# Patient Record
Sex: Female | Born: 1950 | Race: White | Hispanic: No | Marital: Married | Smoking: Former smoker
Health system: Southern US, Community
[De-identification: ages and names within clinical notes are randomized; demographics above are authoritative.]

## PROBLEM LIST (undated history)

## (undated) DIAGNOSIS — E785 Hyperlipidemia, unspecified: Secondary | ICD-10-CM

## (undated) DIAGNOSIS — O00109 Unspecified tubal pregnancy without intrauterine pregnancy: Secondary | ICD-10-CM

## (undated) DIAGNOSIS — G629 Polyneuropathy, unspecified: Secondary | ICD-10-CM

## (undated) DIAGNOSIS — E119 Type 2 diabetes mellitus without complications: Secondary | ICD-10-CM

## (undated) DIAGNOSIS — T7840XA Allergy, unspecified, initial encounter: Secondary | ICD-10-CM

## (undated) DIAGNOSIS — M1990 Unspecified osteoarthritis, unspecified site: Secondary | ICD-10-CM

## (undated) DIAGNOSIS — I10 Essential (primary) hypertension: Secondary | ICD-10-CM

## (undated) DIAGNOSIS — M869 Osteomyelitis, unspecified: Secondary | ICD-10-CM

## (undated) DIAGNOSIS — I509 Heart failure, unspecified: Secondary | ICD-10-CM

## (undated) DIAGNOSIS — C50919 Malignant neoplasm of unspecified site of unspecified female breast: Secondary | ICD-10-CM

## (undated) DIAGNOSIS — E079 Disorder of thyroid, unspecified: Secondary | ICD-10-CM

## (undated) DIAGNOSIS — E039 Hypothyroidism, unspecified: Secondary | ICD-10-CM

## (undated) DIAGNOSIS — N183 Chronic kidney disease, stage 3 unspecified: Secondary | ICD-10-CM

## (undated) HISTORY — DX: Unspecified tubal pregnancy without intrauterine pregnancy: O00.109

## (undated) HISTORY — DX: Type 2 diabetes mellitus without complications: E11.9

## (undated) HISTORY — DX: Allergy, unspecified, initial encounter: T78.40XA

## (undated) HISTORY — PX: ECTOPIC PREGNANCY SURGERY: SHX613

## (undated) HISTORY — DX: Disorder of thyroid, unspecified: E07.9

## (undated) HISTORY — PX: APPENDECTOMY: SHX54

---

## 2003-06-19 HISTORY — PX: CHOLECYSTECTOMY: SHX55

## 2008-06-18 DIAGNOSIS — C50911 Malignant neoplasm of unspecified site of right female breast: Secondary | ICD-10-CM

## 2008-06-18 DIAGNOSIS — C50919 Malignant neoplasm of unspecified site of unspecified female breast: Secondary | ICD-10-CM

## 2008-06-18 HISTORY — PX: CATARACT EXTRACTION W/ INTRAOCULAR LENS  IMPLANT, BILATERAL: SHX1307

## 2008-06-18 HISTORY — DX: Malignant neoplasm of unspecified site of unspecified female breast: C50.919

## 2008-06-18 HISTORY — DX: Malignant neoplasm of unspecified site of right female breast: C50.911

## 2008-06-18 HISTORY — PX: MASTECTOMY: SHX3

## 2008-06-18 HISTORY — PX: MASTECTOMY, PARTIAL: SHX709

## 2009-06-18 HISTORY — PX: REDUCTION MAMMAPLASTY: SUR839

## 2011-06-19 LAB — HM MAMMOGRAPHY: HM Mammogram: NORMAL

## 2013-07-06 ENCOUNTER — Ambulatory Visit: Payer: Self-pay | Admitting: Physician Assistant

## 2013-07-06 DIAGNOSIS — R05 Cough: Secondary | ICD-10-CM | POA: Diagnosis not present

## 2013-07-06 DIAGNOSIS — J40 Bronchitis, not specified as acute or chronic: Secondary | ICD-10-CM | POA: Diagnosis not present

## 2013-07-06 DIAGNOSIS — R059 Cough, unspecified: Secondary | ICD-10-CM | POA: Diagnosis not present

## 2013-07-06 DIAGNOSIS — R918 Other nonspecific abnormal finding of lung field: Secondary | ICD-10-CM | POA: Diagnosis not present

## 2013-07-06 DIAGNOSIS — J111 Influenza due to unidentified influenza virus with other respiratory manifestations: Secondary | ICD-10-CM | POA: Diagnosis not present

## 2013-07-06 LAB — RAPID INFLUENZA A&B ANTIGENS

## 2013-09-21 LAB — LIPID PANEL
CHOLESTEROL: 198 mg/dL (ref 0–200)
HDL: 43 mg/dL (ref 35–70)
LDL Cholesterol: 84 mg/dL
TRIGLYCERIDES: 355 mg/dL — AB (ref 40–160)

## 2014-01-25 DIAGNOSIS — H40009 Preglaucoma, unspecified, unspecified eye: Secondary | ICD-10-CM | POA: Diagnosis not present

## 2014-02-16 DIAGNOSIS — H40009 Preglaucoma, unspecified, unspecified eye: Secondary | ICD-10-CM | POA: Diagnosis not present

## 2014-02-23 DIAGNOSIS — H40009 Preglaucoma, unspecified, unspecified eye: Secondary | ICD-10-CM | POA: Diagnosis not present

## 2014-04-20 DIAGNOSIS — H40013 Open angle with borderline findings, low risk, bilateral: Secondary | ICD-10-CM | POA: Diagnosis not present

## 2014-05-19 DIAGNOSIS — R21 Rash and other nonspecific skin eruption: Secondary | ICD-10-CM | POA: Diagnosis not present

## 2014-05-19 DIAGNOSIS — E119 Type 2 diabetes mellitus without complications: Secondary | ICD-10-CM | POA: Diagnosis not present

## 2014-05-19 DIAGNOSIS — Z23 Encounter for immunization: Secondary | ICD-10-CM | POA: Diagnosis not present

## 2014-05-19 DIAGNOSIS — M170 Bilateral primary osteoarthritis of knee: Secondary | ICD-10-CM | POA: Diagnosis not present

## 2014-05-19 DIAGNOSIS — E039 Hypothyroidism, unspecified: Secondary | ICD-10-CM | POA: Diagnosis not present

## 2014-05-19 DIAGNOSIS — I10 Essential (primary) hypertension: Secondary | ICD-10-CM | POA: Diagnosis not present

## 2014-08-10 DIAGNOSIS — E119 Type 2 diabetes mellitus without complications: Secondary | ICD-10-CM | POA: Diagnosis not present

## 2014-08-10 DIAGNOSIS — J111 Influenza due to unidentified influenza virus with other respiratory manifestations: Secondary | ICD-10-CM | POA: Diagnosis not present

## 2014-08-10 DIAGNOSIS — I10 Essential (primary) hypertension: Secondary | ICD-10-CM | POA: Diagnosis not present

## 2014-08-10 DIAGNOSIS — E039 Hypothyroidism, unspecified: Secondary | ICD-10-CM | POA: Diagnosis not present

## 2014-08-10 LAB — HEMOGLOBIN A1C: HEMOGLOBIN A1C: 9.8 % — AB (ref 4.0–6.0)

## 2014-08-10 LAB — TSH: TSH: 1.42 u[IU]/mL (ref <=5.90)

## 2014-09-03 DIAGNOSIS — I10 Essential (primary) hypertension: Secondary | ICD-10-CM | POA: Diagnosis not present

## 2014-09-03 DIAGNOSIS — E039 Hypothyroidism, unspecified: Secondary | ICD-10-CM | POA: Diagnosis not present

## 2014-09-03 DIAGNOSIS — E1165 Type 2 diabetes mellitus with hyperglycemia: Secondary | ICD-10-CM | POA: Diagnosis not present

## 2014-09-03 DIAGNOSIS — E1142 Type 2 diabetes mellitus with diabetic polyneuropathy: Secondary | ICD-10-CM | POA: Diagnosis not present

## 2014-09-03 DIAGNOSIS — M170 Bilateral primary osteoarthritis of knee: Secondary | ICD-10-CM | POA: Diagnosis not present

## 2014-10-04 ENCOUNTER — Encounter: Payer: Self-pay | Admitting: Internal Medicine

## 2014-10-04 DIAGNOSIS — E1142 Type 2 diabetes mellitus with diabetic polyneuropathy: Secondary | ICD-10-CM | POA: Insufficient documentation

## 2014-10-04 DIAGNOSIS — E118 Type 2 diabetes mellitus with unspecified complications: Secondary | ICD-10-CM | POA: Insufficient documentation

## 2014-10-04 DIAGNOSIS — E1165 Type 2 diabetes mellitus with hyperglycemia: Secondary | ICD-10-CM

## 2014-10-04 DIAGNOSIS — E039 Hypothyroidism, unspecified: Secondary | ICD-10-CM | POA: Insufficient documentation

## 2014-10-04 DIAGNOSIS — R21 Rash and other nonspecific skin eruption: Secondary | ICD-10-CM | POA: Insufficient documentation

## 2014-10-04 DIAGNOSIS — M1710 Unilateral primary osteoarthritis, unspecified knee: Secondary | ICD-10-CM | POA: Insufficient documentation

## 2014-10-04 DIAGNOSIS — M179 Osteoarthritis of knee, unspecified: Secondary | ICD-10-CM | POA: Insufficient documentation

## 2014-10-04 DIAGNOSIS — I10 Essential (primary) hypertension: Secondary | ICD-10-CM | POA: Insufficient documentation

## 2014-10-04 DIAGNOSIS — E1149 Type 2 diabetes mellitus with other diabetic neurological complication: Secondary | ICD-10-CM | POA: Insufficient documentation

## 2014-10-12 DIAGNOSIS — H40003 Preglaucoma, unspecified, bilateral: Secondary | ICD-10-CM | POA: Diagnosis not present

## 2014-12-06 ENCOUNTER — Other Ambulatory Visit: Payer: Self-pay | Admitting: Internal Medicine

## 2014-12-06 ENCOUNTER — Encounter: Payer: Self-pay | Admitting: Internal Medicine

## 2014-12-06 ENCOUNTER — Ambulatory Visit (INDEPENDENT_AMBULATORY_CARE_PROVIDER_SITE_OTHER): Payer: Medicare Other | Admitting: Internal Medicine

## 2014-12-06 VITALS — BP 100/60 | HR 84 | Ht 68.0 in | Wt 243.6 lb

## 2014-12-06 DIAGNOSIS — E1142 Type 2 diabetes mellitus with diabetic polyneuropathy: Secondary | ICD-10-CM | POA: Diagnosis not present

## 2014-12-06 DIAGNOSIS — I10 Essential (primary) hypertension: Secondary | ICD-10-CM | POA: Diagnosis not present

## 2014-12-06 DIAGNOSIS — E039 Hypothyroidism, unspecified: Secondary | ICD-10-CM

## 2014-12-06 MED ORDER — GABAPENTIN 100 MG PO CAPS: 300.0000 mg | ORAL_CAPSULE | Freq: Every day | ORAL | Status: DC

## 2014-12-06 NOTE — Progress Notes (Signed)
Date:  12/06/2014   Name:  Kendra Walton   DOB:  1950-11-10   MRN:  [id number]   Chief Complaint: Diabetes and Hypertension Diabetes She presents for her follow-up diabetic visit. She has type 2 diabetes mellitus. Her disease course has been improving (last A1C 9.0). Pertinent negatives for hypoglycemia include no dizziness, headaches or tremors. Associated symptoms include foot paresthesias (improved some with gabapentin 100 mg a hs). Pertinent negatives for diabetes include no chest pain, no polydipsia and no polyuria. Symptoms are stable. Current diabetic treatment includes oral agent (monotherapy). She is compliant with treatment all of the time. Her weight is decreasing steadily. She is following a generally healthy (portion control rather than strict low carb) diet. She never participates in exercise. Her home blood glucose trend is decreasing steadily. Her breakfast blood glucose is taken between 7-8 am. Her breakfast blood glucose range is generally 140-180 mg/dl. An ACE inhibitor/angiotensin II receptor blocker is being taken. Eye exam is current.  Hypertension This is a chronic problem. The problem is unchanged. Associated symptoms include neck pain. Pertinent negatives include no chest pain, headaches, palpitations or shortness of breath. Risk factors for coronary artery disease include diabetes mellitus and dyslipidemia. Hypertensive end-organ damage includes a thyroid problem.  Thyroid Problem Presents for follow-up visit. Patient reports no cold intolerance, constipation, hoarse voice, leg swelling, palpitations or tremors. The symptoms have been stable. Past treatments include levothyroxine. The treatment provided mild relief. The following procedures have not been performed: radioiodine uptake scan and thyroidectomy.     Review of Systems:  Review of Systems  Constitutional: Positive for unexpected weight change (14 lbs with recent diet changes). Negative for fever.  HENT:  Negative for hoarse voice.   Eyes: Negative for visual disturbance (taken off of glaucoma eye drops).  Respiratory: Negative for chest tightness and shortness of breath.   Cardiovascular: Negative for chest pain, palpitations and leg swelling.  Gastrointestinal: Negative for constipation.  Endocrine: Negative for cold intolerance, polydipsia and polyuria.  Musculoskeletal: Positive for neck pain.  Skin: Negative for rash.  Neurological: Positive for numbness. Negative for dizziness, tremors and headaches.  Psychiatric/Behavioral: Negative for dysphoric mood and decreased concentration.    Patient Active Problem List   Diagnosis Date Noted  . Acquired hypothyroidism 10/04/2014  . Diabetes mellitus with polyneuropathy 10/04/2014  . Essential (primary) hypertension 10/04/2014  . Arthritis of knee, degenerative 10/04/2014  . Cutaneous eruption 10/04/2014  . Diabetes mellitus type 2, uncontrolled 10/04/2014    Prior to Admission medications   Medication Sig Start Date End Date Taking? Authorizing Provider  Cholecalciferol (VITAMIN D) 2000 UNITS tablet Take 1 tablet by mouth daily.    Historical Provider, MD  Cyanocobalamin 500 MCG LOZG Take 1 lozenge by mouth daily.    Historical Provider, MD  esomeprazole (NEXIUM) 20 MG capsule Take 1 capsule by mouth daily.    Historical Provider, MD  gabapentin (NEURONTIN) 100 MG capsule Take 1 capsule by mouth at bedtime. 09/03/14   Historical Provider, MD  latanoprost (XALATAN) 0.005 % ophthalmic solution Apply to eye daily.    Historical Provider, MD  levothyroxine (SYNTHROID, LEVOTHROID) 125 MCG tablet Take 1 tablet by mouth daily.    Historical Provider, MD  lisinopril-hydrochlorothiazide (PRINZIDE,ZESTORETIC) 10-12.5 MG per tablet Take 1 tablet by mouth daily.    Historical Provider, MD  Magnesium Oxide 400 (240 MG) MG TABS Take 1 tablet by mouth daily.    Historical Provider, MD  metFORMIN (GLUCOPHAGE-XR) 500 MG 24 hr tablet Take 1,000 mg  by mouth  2 (two) times daily.    Historical Provider, MD  MULTIPLE VITAMINS-MINERALS ER PO Take 1 tablet by mouth daily.    Historical Provider, MD  nystatin cream (MYCOSTATIN) Apply 1 application topically 2 (two) times daily. 05/19/14   Historical Provider, MD  ONE TOUCH ULTRA TEST test strip U UTD TO TEST D 10/24/14   Historical Provider, MD  Jonetta Speak LANCETS 99I MISC U UTD 12/01/14   Historical Provider, MD  Potassium Gluconate 550 MG TABS Take 1 tablet by mouth daily.    Historical Provider, MD  tamoxifen (NOLVADEX) 20 MG tablet Take 1 tablet by mouth daily.    Historical Provider, MD  traMADol (ULTRAM) 50 MG tablet Take 1 tablet by mouth 4 (four) times daily as needed. 09/03/14   Historical Provider, MD    No Known Allergies  Past Surgical History  Procedure Laterality Date  . Mastectomy Right 2010  . Cholecystectomy  2005    History  Substance Use Topics  . Smoking status: Former Smoker    Quit date: 06/18/2012  . Smokeless tobacco: Not on file  . Alcohol Use: Not on file     Medication list has been reviewed and updated.  Physical Examination:  Physical Exam  Constitutional: She is oriented to person, place, and time. She appears well-developed and well-nourished.  HENT:  Head: Normocephalic.  Neck: No thyromegaly present.  Cardiovascular: Normal rate, regular rhythm and normal heart sounds.   Pulmonary/Chest: Effort normal and breath sounds normal. No respiratory distress. She has no wheezes.  Musculoskeletal: She exhibits no edema or tenderness.  Lymphadenopathy:    She has no cervical adenopathy.  Neurological: She is alert and oriented to person, place, and time.    BP 100/60 mmHg  Pulse 84  Ht 5\' 8"  (1.727 m)  Wt 243 lb 9.6 oz (110.496 kg)  BMI 37.05 kg/m2  Assessment and Plan: 1. Essential (primary) hypertension Controlled - Basic metabolic panel  2. Diabetic polyneuropathy associated with type 2 diabetes mellitus Will add medication if needed according  to A1C Increase gabapentin to 300 mg hs - Hemoglobin A1c - gabapentin (NEURONTIN) 100 MG capsule; Take 3 capsules (300 mg total) by mouth at bedtime.  Dispense: 90 capsule; Refill: 5  3. Acquired hypothyroidism supplemented   Halina Maidens, MD [facility]  12/06/2014

## 2014-12-06 NOTE — Patient Instructions (Signed)
Increase gabapentin up to 300 mg at bedtime.

## 2014-12-07 LAB — BASIC METABOLIC PANEL
BUN/Creatinine Ratio: 21 (ref 11–26)
BUN: 14 mg/dL (ref 8–27)
CALCIUM: 9.6 mg/dL (ref 8.7–10.3)
CO2: 26 mmol/L (ref 18–29)
Chloride: 94 mmol/L — ABNORMAL LOW (ref 97–108)
Creatinine, Ser: 0.67 mg/dL (ref 0.57–1.00)
GFR calc Af Amer: 106 mL/min/{1.73_m2} (ref >59)
GFR calc non Af Amer: 92 mL/min/{1.73_m2} (ref >59)
Glucose: 153 mg/dL — ABNORMAL HIGH (ref 65–99)
POTASSIUM: 4.7 mmol/L (ref 3.5–5.2)
Sodium: 138 mmol/L (ref 134–144)

## 2014-12-07 LAB — HEMOGLOBIN A1C
Est. average glucose Bld gHb Est-mCnc: 151 mg/dL
HEMOGLOBIN A1C: 6.9 % — AB (ref 4.8–5.6)

## 2015-02-07 ENCOUNTER — Telehealth: Payer: Self-pay

## 2015-02-07 NOTE — Telephone Encounter (Signed)
Patient states she needs Rx for Tramadol, has been taking 2 t.i.d. Rather than four a day, is out and needs this refilled.dr

## 2015-02-08 ENCOUNTER — Other Ambulatory Visit: Payer: Self-pay | Admitting: Internal Medicine

## 2015-02-08 MED ORDER — TRAMADOL HCL 50 MG PO TABS: 100.0000 mg | ORAL_TABLET | Freq: Three times a day (TID) | ORAL | Status: DC | PRN

## 2015-02-21 ENCOUNTER — Other Ambulatory Visit: Payer: Self-pay | Admitting: Internal Medicine

## 2015-02-28 ENCOUNTER — Other Ambulatory Visit: Payer: Self-pay | Admitting: Internal Medicine

## 2015-03-28 ENCOUNTER — Other Ambulatory Visit: Payer: Self-pay | Admitting: Internal Medicine

## 2015-04-07 ENCOUNTER — Ambulatory Visit
Admission: RE | Admit: 2015-04-07 | Discharge: 2015-04-07 | Disposition: A | Discharge status: Home / Self Care | Payer: Medicare Other | Source: Ambulatory Visit | Attending: Internal Medicine | Admitting: Internal Medicine

## 2015-04-07 ENCOUNTER — Ambulatory Visit (INDEPENDENT_AMBULATORY_CARE_PROVIDER_SITE_OTHER): Payer: Medicare Other | Admitting: Internal Medicine

## 2015-04-07 ENCOUNTER — Other Ambulatory Visit: Payer: Self-pay | Admitting: Internal Medicine

## 2015-04-07 ENCOUNTER — Encounter: Payer: Self-pay | Admitting: Internal Medicine

## 2015-04-07 VITALS — BP 100/54 | HR 88 | Ht 68.0 in | Wt 230.6 lb

## 2015-04-07 DIAGNOSIS — E1142 Type 2 diabetes mellitus with diabetic polyneuropathy: Secondary | ICD-10-CM | POA: Diagnosis not present

## 2015-04-07 DIAGNOSIS — I10 Essential (primary) hypertension: Secondary | ICD-10-CM | POA: Diagnosis not present

## 2015-04-07 DIAGNOSIS — M25531 Pain in right wrist: Secondary | ICD-10-CM | POA: Insufficient documentation

## 2015-04-07 DIAGNOSIS — Z853 Personal history of malignant neoplasm of breast: Secondary | ICD-10-CM | POA: Insufficient documentation

## 2015-04-07 DIAGNOSIS — R300 Dysuria: Secondary | ICD-10-CM | POA: Diagnosis not present

## 2015-04-07 DIAGNOSIS — Z23 Encounter for immunization: Secondary | ICD-10-CM

## 2015-04-07 DIAGNOSIS — E1165 Type 2 diabetes mellitus with hyperglycemia: Secondary | ICD-10-CM | POA: Diagnosis not present

## 2015-04-07 DIAGNOSIS — Z1239 Encounter for other screening for malignant neoplasm of breast: Secondary | ICD-10-CM

## 2015-04-07 DIAGNOSIS — E039 Hypothyroidism, unspecified: Secondary | ICD-10-CM

## 2015-04-07 DIAGNOSIS — C50911 Malignant neoplasm of unspecified site of right female breast: Secondary | ICD-10-CM | POA: Diagnosis not present

## 2015-04-07 DIAGNOSIS — Z170 Estrogen receptor positive status [ER+]: Secondary | ICD-10-CM

## 2015-04-07 DIAGNOSIS — IMO0002 Reserved for concepts with insufficient information to code with codable children: Secondary | ICD-10-CM

## 2015-04-07 LAB — POCT URINALYSIS DIPSTICK
Bilirubin, UA: NEGATIVE
Blood, UA: NEGATIVE
Glucose, UA: NEGATIVE
KETONES UA: NEGATIVE
Leukocytes, UA: NEGATIVE
Nitrite, UA: NEGATIVE
PROTEIN UA: NEGATIVE
SPEC GRAV UA: 1.01
UROBILINOGEN UA: 0.2
pH, UA: 5

## 2015-04-07 MED ORDER — ONETOUCH DELICA LANCETS 33G MISC: 1.0000 | Freq: Two times a day (BID) | Status: DC

## 2015-04-07 MED ORDER — ONETOUCH ULTRA BLUE VI STRP: 1.0000 | ORAL_STRIP | Freq: Two times a day (BID) | Status: DC

## 2015-04-07 MED ORDER — LISINOPRIL-HYDROCHLOROTHIAZIDE 10-12.5 MG PO TABS: 1.0000 | ORAL_TABLET | Freq: Every day | ORAL | Status: DC

## 2015-04-07 MED ORDER — LEVOTHYROXINE SODIUM 125 MCG PO TABS: 250.0000 ug | ORAL_TABLET | Freq: Every day | ORAL | Status: DC

## 2015-04-07 NOTE — Progress Notes (Signed)
Date:  04/07/2015   Name:  Kendra Walton   DOB:  [birth year]   MRN:  [id number]   Chief Complaint: Diabetes; Hypertension; Hypothyroidism; and Urinary Tract Infection Diabetes She presents for her follow-up diabetic visit. She has type 2 diabetes mellitus. Her disease course has been stable. Pertinent negatives for hypoglycemia include no headaches, nervousness/anxiousness or tremors. Pertinent negatives for diabetes include no chest pain and no fatigue. Current diabetic treatment includes oral agent (monotherapy) (metformin). Her breakfast blood glucose is taken between 8-9 am. Her breakfast blood glucose range is generally 110-130 mg/dl. Her dinner blood glucose is taken between 6-7 pm. Her dinner blood glucose range is generally 140-180 mg/dl.  Hypertension This is a chronic problem. The current episode started more than 1 year ago. The problem is controlled. Pertinent negatives include no chest pain, headaches, neck pain, palpitations or shortness of breath. Past treatments include ACE inhibitors and diuretics. There are no compliance problems.  Hypertensive end-organ damage includes a thyroid problem.  Urinary Tract Infection  This is a new problem. The current episode started yesterday. The problem occurs intermittently. The pain is mild (tingling sensation). There has been no fever. There is no history of pyelonephritis. Associated symptoms include urgency. Pertinent negatives include no chills, frequency or hematuria. She has tried increased fluids for the symptoms. The treatment provided mild (feels better this am after a dose of keflex) relief.  Wrist Pain  This is a new problem. The current episode started more than 1 month ago. There has been no history of extremity trauma. The problem occurs constantly. The problem has been unchanged. The quality of the pain is described as aching. Pertinent negatives include no fever or numbness. She has tried nothing for the symptoms. Family  history does not include gout or rheumatoid arthritis. Her past medical history is significant for diabetes.  Thyroid Problem Presents for follow-up visit. Patient reports no anxiety, constipation, diaphoresis, fatigue, palpitations or tremors. The symptoms have been stable. Past treatments include levothyroxine. Her past medical history is significant for diabetes. There are no known risk factors.  History of breast cancer -patient has completed 5 years of tamoxifen therapy. Her inclination was to continue taking it indefinitely - however in view of lack of evidence will discontinue it. She is encouraged to follow-up with annual left diagnostic mammograms.   Review of Systems  Constitutional: Positive for unexpected weight change (slowly losing weight with diet changes). Negative for fever, chills, diaphoresis and fatigue.  HENT: Negative for ear pain, hearing loss, sinus pressure, sore throat, trouble swallowing and voice change.   Eyes: Negative for visual disturbance.  Respiratory: Negative for cough, shortness of breath and wheezing.   Cardiovascular: Negative for chest pain, palpitations and leg swelling.  Gastrointestinal: Negative for abdominal pain, constipation and blood in stool.  Genitourinary: Positive for urgency. Negative for dysuria, frequency, hematuria, vaginal bleeding and vaginal discharge.  Musculoskeletal: Positive for arthralgias. Negative for neck pain and neck stiffness.  Skin: Negative for color change and rash.  Allergic/Immunologic: Negative for environmental allergies.  Neurological: Negative for tremors, numbness and headaches.  Hematological: Negative for adenopathy. Does not bruise/bleed easily.  Psychiatric/Behavioral: Negative for sleep disturbance and dysphoric mood. The patient is not nervous/anxious.     Patient Active Problem List   Diagnosis Date Noted  . Wrist pain, right 04/07/2015  . Cancer of right breast, stage 1, estrogen receptor positive ([place])  04/07/2015  . Acquired hypothyroidism 10/04/2014  . Diabetes mellitus with polyneuropathy ([place]) 10/04/2014  . Essential (  primary) hypertension 10/04/2014  . Arthritis of knee, degenerative 10/04/2014  . Cutaneous eruption 10/04/2014  . Diabetes mellitus type 2, uncontrolled ([place]) 10/04/2014    Prior to Admission medications   Medication Sig Start Date End Date Taking? Authorizing Provider  gabapentin (NEURONTIN) 100 MG capsule Take 3 capsules (300 mg total) by mouth at bedtime. 12/06/14  Yes Glean Hess, MD  levothyroxine (SYNTHROID, LEVOTHROID) 125 MCG tablet Take 2 tablets by mouth daily.    Yes Historical Provider, MD  lisinopril-hydrochlorothiazide (PRINZIDE,ZESTORETIC) 10-12.5 MG per tablet Take 1 tablet by mouth daily.   Yes Historical Provider, MD  metFORMIN (GLUCOPHAGE-XR) 500 MG 24 hr tablet TAKE 2 TABLETS BY MOUTH TWICE DAILY 03/29/15  Yes Glean Hess, MD  MULTIPLE VITAMINS-MINERALS ER PO Take 1 tablet by mouth daily.   Yes Historical Provider, MD  ONE TOUCH ULTRA TEST test strip U UTD TO TEST D 10/24/14  Yes Historical Provider, MD  [facility] DELICA LANCETS 22W MISC U UTD 12/01/14  Yes Historical Provider, MD  tamoxifen (NOLVADEX) 20 MG tablet Take 1 tablet by mouth daily.   Yes Historical Provider, MD  traMADol (ULTRAM) 50 MG tablet Take 2 tablets (100 mg total) by mouth 3 (three) times daily as needed. 02/08/15  Yes Glean Hess, MD  Magnesium Oxide 400 (240 MG) MG TABS Take 1 tablet by mouth daily.    Historical Provider, MD    No Known Allergies  Past Surgical History  Procedure Laterality Date  . Mastectomy Right 2010  . Cholecystectomy  2005    Social History  Substance Use Topics  . Smoking status: Former Smoker    Quit date: 06/18/2012  . Smokeless tobacco: None  . Alcohol Use: No    Medication list has been reviewed and updated.   Physical Exam  Constitutional: She is oriented to person, place, and time. She appears well-developed and  well-nourished. No distress.  HENT:  Head: Normocephalic and atraumatic.  Eyes: Conjunctivae are normal. Right eye exhibits no discharge. Left eye exhibits no discharge. No scleral icterus.  Neck: Normal range of motion. Neck supple. No thyromegaly present.  Cardiovascular: Normal rate, regular rhythm and normal heart sounds.   Pulmonary/Chest: Effort normal and breath sounds normal. No respiratory distress. She has no wheezes.  Musculoskeletal: Normal range of motion. She exhibits no edema.       Arms: Lymphadenopathy:    She has no cervical adenopathy.  Neurological: She is alert and oriented to person, place, and time. She has normal reflexes.  Skin: Skin is warm and dry. No rash noted.  Psychiatric: She has a normal mood and affect. Her behavior is normal. Thought content normal.  Nursing note and vitals reviewed.   BP 100/54 mmHg  Pulse 88  Ht 5\' 8"  (1.727 m)  Wt 230 lb 9.6 oz (104.599 kg)  BMI 35.07 kg/m2  Assessment and Plan: 1. Dysuria Urinalysis is negative; recommend increasing fluid intake and follow-up if symptoms worsen - POCT urinalysis dipstick  2. Flu vaccine need - Flu Vaccine QUAD 36+ mos PF IM (Fluarix & Fluzone Quad PF)  3. Diabetic polyneuropathy associated with type 2 diabetes mellitus (HCC) Continue oral agents weight loss and diet - Hemoglobin A1c  4. Uncontrolled type 2 diabetes mellitus with diabetic polyneuropathy, without long-term current use of insulin (HCC) Stable symptoms without progression - Hemoglobin A1c - ONE TOUCH ULTRA TEST test strip; 1 each by Other route 2 (two) times daily. Use as instructed  Dispense: 100 each; Refill: 3 -  ONETOUCH DELICA LANCETS 79K MISC; 1 each by Other route 2 (two) times daily.  Dispense: 100 each; Refill: 3  5. Acquired hypothyroidism Supplementing - levothyroxine (SYNTHROID, LEVOTHROID) 125 MCG tablet; Take 2 tablets (250 mcg total) by mouth daily.  Dispense: 30 tablet; Refill: 5 - TSH  6. Essential  (primary) hypertension Controlled - lisinopril-hydrochlorothiazide (PRINZIDE,ZESTORETIC) 10-12.5 MG tablet; Take 1 tablet by mouth daily.  Dispense: 30 tablet; Refill: 5  7. Wrist pain, right Obtain x-ray to rule out structural problem Begin Aspercreme topical 3 times a day and modify activities for suspected tendinitis - DG Wrist Complete Right; Future  8. HX: breast cancer Due for left mammogram Will discontinue tamoxifen - MM Digital Diagnostic Unilat L; Future   Halina Maidens, MD [facility]  04/07/2015

## 2015-04-08 LAB — TSH

## 2015-04-08 LAB — HEMOGLOBIN A1C
ESTIMATED AVERAGE GLUCOSE: 146 mg/dL
HEMOGLOBIN A1C: 6.7 % — AB (ref 4.8–5.6)

## 2015-04-15 DIAGNOSIS — H40003 Preglaucoma, unspecified, bilateral: Secondary | ICD-10-CM | POA: Diagnosis not present

## 2015-06-10 ENCOUNTER — Other Ambulatory Visit: Payer: Self-pay | Admitting: Internal Medicine

## 2015-07-13 ENCOUNTER — Other Ambulatory Visit: Payer: Self-pay | Admitting: Internal Medicine

## 2015-08-08 ENCOUNTER — Encounter: Payer: Self-pay | Admitting: Internal Medicine

## 2015-08-08 ENCOUNTER — Ambulatory Visit (INDEPENDENT_AMBULATORY_CARE_PROVIDER_SITE_OTHER): Payer: Medicare Other | Admitting: Internal Medicine

## 2015-08-08 VITALS — BP 102/68 | HR 74 | Ht 68.0 in | Wt 227.8 lb

## 2015-08-08 DIAGNOSIS — E039 Hypothyroidism, unspecified: Secondary | ICD-10-CM | POA: Diagnosis not present

## 2015-08-08 DIAGNOSIS — E1165 Type 2 diabetes mellitus with hyperglycemia: Secondary | ICD-10-CM

## 2015-08-08 DIAGNOSIS — E1142 Type 2 diabetes mellitus with diabetic polyneuropathy: Secondary | ICD-10-CM

## 2015-08-08 DIAGNOSIS — IMO0002 Reserved for concepts with insufficient information to code with codable children: Secondary | ICD-10-CM

## 2015-08-08 DIAGNOSIS — Z1231 Encounter for screening mammogram for malignant neoplasm of breast: Secondary | ICD-10-CM | POA: Diagnosis not present

## 2015-08-08 DIAGNOSIS — Z23 Encounter for immunization: Secondary | ICD-10-CM | POA: Diagnosis not present

## 2015-08-08 DIAGNOSIS — I10 Essential (primary) hypertension: Secondary | ICD-10-CM | POA: Diagnosis not present

## 2015-08-08 LAB — POCT URINALYSIS DIPSTICK
Bilirubin, UA: NEGATIVE
Glucose, UA: NEGATIVE
Ketones, UA: NEGATIVE
Leukocytes, UA: NEGATIVE
Nitrite, UA: NEGATIVE
PH UA: 6
PROTEIN UA: NEGATIVE
RBC UA: NEGATIVE
SPEC GRAV UA: 1.015
UROBILINOGEN UA: 0.2

## 2015-08-08 NOTE — Progress Notes (Signed)
Patient: Kendra Walton, Female    DOB: 1950/09/30, 65 y.o.   MRN: [id number] Visit Date: 08/08/2015  Today's Provider: Halina Maidens, MD   Chief Complaint  Patient presents with  . Medicare Wellness   Subjective:    Annual wellness visit Kendra Walton is a 65 y.o. female who presents today for her Subsequent Annual Wellness Visit. She feels fairly well. She reports exercising some. She reports she is sleeping well.   ----------------------------------------------------------- Diabetes She presents for her follow-up diabetic visit. She has type 2 diabetes mellitus. Her disease course has been stable. There are no hypoglycemic associated symptoms. Pertinent negatives for hypoglycemia include no dizziness, headaches, nervousness/anxiousness or tremors. Associated symptoms include polydipsia. Pertinent negatives for diabetes include no blurred vision, no chest pain, no fatigue and no polyuria. There are no hypoglycemic complications. Symptoms are stable. Current diabetic treatment includes oral agent (monotherapy). She is compliant with treatment most of the time. Her weight is stable (still has some diarrhea with metformin). She is following a generally healthy diet. When asked about meal planning, she reported none. She monitors blood glucose at home 1-2 x per day. Her breakfast blood glucose is taken between 7-8 am. Her breakfast blood glucose range is generally 110-130 mg/dl. An ACE inhibitor/angiotensin II receptor blocker is being taken.  Hypertension This is a chronic problem. The current episode started more than 1 year ago. The problem is unchanged. The problem is controlled. Pertinent negatives include no blurred vision, chest pain, headaches, palpitations or shortness of breath. Past treatments include ACE inhibitors. Hypertensive end-organ damage includes a thyroid problem.  Thyroid Problem Presents for follow-up visit. Symptoms include diarrhea. Patient reports  no anxiety, constipation, fatigue, palpitations or tremors. The symptoms have been stable. Past treatments include levothyroxine (taking 1 1/2 levothyroxine 125 mcg daily).    Review of Systems  Constitutional: Negative for fever, chills and fatigue.  HENT: Negative for congestion, hearing loss, tinnitus, trouble swallowing and voice change.   Eyes: Negative for blurred vision and visual disturbance.  Respiratory: Negative for cough, chest tightness, shortness of breath and wheezing.   Cardiovascular: Negative for chest pain, palpitations and leg swelling.  Gastrointestinal: Positive for diarrhea. Negative for nausea, vomiting, abdominal pain, constipation and blood in stool.  Endocrine: Positive for polydipsia. Negative for polyuria.  Genitourinary: Negative for dysuria, frequency, vaginal bleeding, vaginal discharge and genital sores.  Musculoskeletal: Negative for joint swelling, arthralgias and gait problem.  Skin: Negative for color change and rash.  Neurological: Negative for dizziness, tremors, syncope, light-headedness and headaches.  Hematological: Negative for adenopathy. Does not bruise/bleed easily.  Psychiatric/Behavioral: Negative for sleep disturbance and dysphoric mood. The patient is not nervous/anxious.     Social History   Social History  . Marital Status: Married    Spouse Name: N/A  . Number of Children: N/A  . Years of Education: N/A   Occupational History  . Not on file.   Social History Main Topics  . Smoking status: Former Smoker    Quit date: 06/18/2012  . Smokeless tobacco: Not on file  . Alcohol Use: No  . Drug Use: No  . Sexual Activity: Not on file   Other Topics Concern  . Not on file   Social History Narrative    Patient Active Problem List   Diagnosis Date Noted  . Wrist pain, right 04/07/2015  . History of breast cancer in female 04/07/2015  . Acquired hypothyroidism 10/04/2014  . Diabetes mellitus with polyneuropathy ([place]) 10/04/2014   .  Essential (primary) hypertension 10/04/2014  . Arthritis of knee, degenerative 10/04/2014  . Cutaneous eruption 10/04/2014  . Diabetes mellitus type 2, uncontrolled ([place]) 10/04/2014    Past Surgical History  Procedure Laterality Date  . Mastectomy Right 2010  . Cholecystectomy  2005    Her family history includes Diabetes in her brother; Hypertension in her brother.    Previous Medications   CHOLECALCIFEROL (VITAMIN D) 1000 UNITS TABLET    Take 1,000 Units by mouth daily.   GABAPENTIN (NEURONTIN) 100 MG CAPSULE    TAKE 3 CAPSULES BY MOUTH AT BEDTIME   LEVOTHYROXINE (SYNTHROID, LEVOTHROID) 125 MCG TABLET    TAKE 2 TABLETS BY MOUTH EVERY DAY   LISINOPRIL-HYDROCHLOROTHIAZIDE (PRINZIDE,ZESTORETIC) 10-12.5 MG TABLET    TAKE 1 TABLET BY MOUTH EVERY DAY   METFORMIN (GLUCOPHAGE-XR) 500 MG 24 HR TABLET    TAKE 2 TABLETS BY MOUTH TWICE DAILY   MULTIPLE VITAMINS-MINERALS ER PO    Take 1 tablet by mouth daily.   ONE TOUCH ULTRA TEST TEST STRIP    1 each by Other route 2 (two) times daily. Use as instructed   ONETOUCH DELICA LANCETS [id number] MISC    1 each by Other route 2 (two) times daily.    Patient Care Team: Glean Hess, MD as PCP - General (Family Medicine)     Objective:   Vitals: BP 102/68 mmHg  Pulse 74  Ht 5\' 8"  (1.727 m)  Wt 227 lb 12.8 oz (103.329 kg)  BMI 34.64 kg/m2  Physical Exam  Constitutional: She is oriented to person, place, and time. She appears well-developed. No distress.  HENT:  Head: Normocephalic and atraumatic.  Eyes: Pupils are equal, round, and reactive to light.  Neck: Normal range of motion. Neck supple. No thyromegaly present.  Cardiovascular: Normal rate, regular rhythm and normal heart sounds.   Pulmonary/Chest: Effort normal and breath sounds normal. No respiratory distress. She has no wheezes. She has no rales. Left breast exhibits no mass, no nipple discharge and no tenderness.  Post reduction changes to left breast  Reconstruction scars and  soft implant noted on right.  No skin mass noted  Abdominal: Soft. Bowel sounds are normal. There is no hepatosplenomegaly. There is no tenderness. There is no rebound, no guarding and no CVA tenderness.  Musculoskeletal: Normal range of motion. She exhibits no edema or tenderness.       Right knee: She exhibits normal range of motion and no effusion.       Left knee: She exhibits normal range of motion and no effusion.  Lymphadenopathy:    She has no cervical adenopathy.  Neurological: She is alert and oriented to person, place, and time. She has normal strength. No cranial nerve deficit or sensory deficit (hyperesthesias to soles of both feet).  Skin: Skin is warm and dry. No rash noted.  Psychiatric: She has a normal mood and affect. Her behavior is normal. Thought content normal.    Activities of Daily Living In your present state of health, do you have any difficulty performing the following activities: 08/08/2015 12/06/2014  Hearing? N N  Vision? N N  Difficulty concentrating or making decisions? N N  Walking or climbing stairs? N N  Dressing or bathing? N N  Doing errands, shopping? N N    Fall Risk Assessment Fall Risk  08/08/2015 12/06/2014  Falls in the past year? No No      Depression Screen PHQ 2/9 Scores 08/08/2015 12/06/2014  PHQ - 2 Score 0 0  Cognitive Testing - 6-CIT   Correct? Score   What year is it? yes 0 Yes = 0    No = 4  What month is it? yes 0 Yes = 0    No = 3  Remember:     Pia [address]au, [place], [place]     What time is it? yes 0 Yes = 0    No = 3  Count backwards from 20 to 1 yes 0 Correct = 0    1 error = 2   More than 1 error = 4  Say the months of the year in reverse. yes 0 Correct = 0    1 error = 2   More than 1 error = 4  What address did I ask you to remember? yes 0 Correct = 0  1 error = 2    2 error = 4    3 error = 6    4 error = 8    All wrong = 10       TOTAL SCORE  0/28   Interpretation:  Normal  Normal (0-7) Abnormal (8-28)    . Lab Results  Component Value Date   HGBA1C 6.7* 04/07/2015   Lab Results  Component Value Date   CHOL 198 09/21/2013   HDL 43 09/21/2013   LDLCALC 84 09/21/2013   TRIG 355* 09/21/2013   Lab Results  Component Value Date   CREATININE 0.67 12/06/2014     Medicare Annual Wellness Visit Summary:  Reviewed patient's Family Medical History Reviewed and updated list of patient's medical providers Assessment of cognitive impairment was done Assessed patient's functional ability Established a written schedule for health screening [place] Completed and Reviewed  Exercise Activities and Dietary recommendations Goals    . <enter goal here>     To resume daily calcium and vitamin D       Immunization History  Administered Date(s) Administered  . Influenza,inj,Quad PF,36+ Mos 04/07/2015  . Influenza-Unspecified 05/19/2014  . Pneumococcal Conjugate-13 08/08/2015    Health Maintenance  Topic Date Due  . Hepatitis C Screening  [birth year]  . TETANUS/TDAP  07/01/1967  . ZOSTAVAX  06/30/2008  . MAMMOGRAM  06/18/2013  . DEXA SCAN  08/07/2020 (Originally 06/30/2013)  . COLONOSCOPY  08/07/2020 (Originally 06/30/1998)  . HEMOGLOBIN A1C  10/06/2015  . INFLUENZA VACCINE  01/17/2016  . OPHTHALMOLOGY EXAM  02/01/2016  . FOOT EXAM  08/07/2016  . PNA vac Low Risk Adult (2 of 2 - PPSV23) 08/07/2016     Discussed health benefits of physical activity, and encouraged her to engage in regular exercise appropriate for her age and condition.    ------------------------------------------------------------------------------------------------------------   Assessment & Plan:  1. Essential (primary) hypertension controlled - CBC with Differential/Platelet - POCT urinalysis dipstick  2. Uncontrolled type 2 diabetes mellitus with diabetic polyneuropathy, without long-term current use of insulin (HCC) Check A1C and advise if additional medication is needed -  Microalbumin / creatinine urine ratio - Comprehensive metabolic panel - Hemoglobin A1c - Lipid panel  3. Acquired hypothyroidism supplemented - TSH  4. Diabetic polyneuropathy associated with type 2 diabetes mellitus (HCC) Will increase gabapentin to 300 mg bid - call for new Rx with new instructions when needed  5. Need for Streptococcus pneumoniae vaccination - Pneumococcal conjugate vaccine 13-valent IM  6. Encounter for screening mammogram for breast cancer - MM DIGITAL SCREENING BILATERAL; Future  MAW measures satisfied. Patient declines colonoscopy, and DEXA.  Rx given to  obtain Zostavax.  Consider Tdap at next visit.  Halina Maidens, MD [facility]  08/08/2015

## 2015-08-08 NOTE — Patient Instructions (Addendum)
Pneumococcal Conjugate Vaccine (PCV13)  1. Why get vaccinated? Vaccination can protect both children and adults from pneumococcal disease. Pneumococcal disease is caused by bacteria that can spread from person to person through close contact. It can cause ear infections, and it can also lead to more serious infections of the:  Lungs (pneumonia),  Blood (bacteremia), and  Covering of the brain and spinal cord (meningitis). Pneumococcal pneumonia is most common among adults. Pneumococcal meningitis can cause deafness and brain damage, and it kills about 1 child in 10 who get it. Anyone can get pneumococcal disease, but children under 28 years of age and adults 43 years and older, people with certain medical conditions, and cigarette smokers are at the highest risk. Before there was a vaccine, the [place] saw:  more than 700 cases of meningitis,  about 13,000 blood infections,  about 5 million ear infections, and  about 200 deaths in children under 5 each year from pneumococcal disease. Since vaccine became available, severe pneumococcal disease in these children has fallen by 88%. About 18,000 older adults die of pneumococcal disease each year in the [place]. Treatment of pneumococcal infections with penicillin and other drugs is not as effective as it used to be, because some strains of the disease have become resistant to these drugs. This makes prevention of the disease, through vaccination, even more important. 2. PCV13 vaccine Pneumococcal conjugate vaccine (called PCV13) protects against 13 types of pneumococcal bacteria. PCV13 is routinely given to children at 2, 4, 6, and 65-74 months of age. It is also recommended for children and adults 70 to 70 years of age with certain health conditions, and for all adults 64 years of age and older. Your doctor can give you details. 3. Some people should not get this vaccine Anyone who has ever had a life-threatening allergic reaction  to a dose of this vaccine, to an earlier pneumococcal vaccine called PCV7, or to any vaccine containing diphtheria toxoid (for example, DTaP), should not get PCV13. Anyone with a severe allergy to any component of PCV13 should not get the vaccine. Tell your doctor if the person being vaccinated has any severe allergies. If the person scheduled for vaccination is not feeling well, your healthcare provider might decide to reschedule the shot on another day. 4. Risks of a vaccine reaction With any medicine, including vaccines, there is a chance of reactions. These are usually mild and go away on their own, but serious reactions are also possible. Problems reported following PCV13 varied by age and dose in the series. The most common problems reported among children were:  About half became drowsy after the shot, had a temporary loss of appetite, or had redness or tenderness where the shot was given.  About 1 out of 3 had swelling where the shot was given.  About 1 out of 3 had a mild fever, and about 1 in 20 had a fever over 102.55F.  Up to about 8 out of 10 became fussy or irritable. Adults have reported pain, redness, and swelling where the shot was given; also mild fever, fatigue, headache, chills, or muscle pain. Young children who get PCV13 along with inactivated flu vaccine at the same time may be at increased risk for seizures caused by fever. Ask your doctor for more information. Problems that could happen after any vaccine:  People sometimes faint after a medical procedure, including vaccination. Sitting or lying down for about 15 minutes can help prevent fainting, and injuries caused by a fall.  Tell your doctor if you feel dizzy, or have vision changes or ringing in the ears.  Some older children and adults get severe pain in the shoulder and have difficulty moving the arm where a shot was given. This happens very rarely.  Any medication can cause a severe allergic reaction. Such  reactions from a vaccine are very rare, estimated at about 1 in a million doses, and would happen within a few minutes to a few hours after the vaccination. As with any medicine, there is a very small chance of a vaccine causing a serious injury or death. The safety of vaccines is always being monitored. For more information, visit: http://www.aguilar.org/ 5. What if there is a serious reaction? What should I look for?  Look for anything that concerns you, such as signs of a severe allergic reaction, very high fever, or unusual behavior. Signs of a severe allergic reaction can include hives, swelling of the face and throat, difficulty breathing, a fast heartbeat, dizziness, and weakness-usually within a few minutes to a few hours after the vaccination. What should I do?  If you think it is a severe allergic reaction or other emergency that can't wait, call 9-1-1 or get the person to the nearest hospital. Otherwise, call your doctor. Reactions should be reported to the Vaccine Adverse Event Reporting System (VAERS). Your doctor should file this report, or you can do it yourself through the VAERS web site at www.vaers.SamedayNews.es, or by calling 8206750678. VAERS does not give medical advice. 6. The National Vaccine Injury Compensation Program The [employer] Vaccine Injury Compensation Program (VICP) is a federal program that was created to compensate people who may have been injured by certain vaccines. Persons who believe they may have been injured by a vaccine can learn about the program and about filing a claim by calling (979) 473-0523 or visiting the [place] website at GoldCloset.com.ee. There is a time limit to file a claim for compensation. 7. How can I learn more?  Ask your healthcare provider. He or she can give you the vaccine package insert or suggest other sources of information.  Call your local or state health department.  Contact the Centers for Disease Control and  Prevention (CDC):  Call 530-861-7452 (1-800-CDC-INFO) or  Visit CDC's website at http://hunter.com/ Vaccine Information Statement PCV13 Vaccine (04/22/2014)   This information is not intended to replace advice given to you by your health care provider. Make sure you discuss any questions you have with your health care provider.   Document Released: 04/01/2006 Document Revised: 06/25/2014 Document Reviewed: 04/29/2014 Elsevier Interactive Patient Education 2016 [place] Maintenance  Topic Date Due  . Hepatitis C Screening  10-28-1950  . TETANUS/TDAP  07/01/1967  . ZOSTAVAX  06/30/2008  . MAMMOGRAM  06/18/2013  . DEXA SCAN  08/07/2020 (Originally 06/30/2013)  . COLONOSCOPY  08/07/2020 (Originally 06/30/1998)  . HEMOGLOBIN A1C  10/06/2015  . INFLUENZA VACCINE  01/17/2016  . OPHTHALMOLOGY EXAM  02/01/2016  . FOOT EXAM  08/07/2016  . PNA vac Low Risk Adult (2 of 2 - PPSV23) 08/07/2016

## 2015-08-09 ENCOUNTER — Other Ambulatory Visit: Payer: Self-pay | Admitting: Internal Medicine

## 2015-08-09 LAB — CBC WITH DIFFERENTIAL/PLATELET
Basophils Absolute: 0 10*3/uL (ref 0.0–0.2)
Basos: 0 %
EOS (ABSOLUTE): 0.2 10*3/uL (ref 0.0–0.4)
EOS: 2 %
HEMATOCRIT: 34.9 % (ref 34.0–46.6)
HEMOGLOBIN: 11.4 g/dL (ref 11.1–15.9)
IMMATURE GRANULOCYTES: 0 %
Immature Grans (Abs): 0 10*3/uL (ref 0.0–0.1)
Lymphocytes Absolute: 3.9 10*3/uL — ABNORMAL HIGH (ref 0.7–3.1)
Lymphs: 32 %
MCH: 27.9 pg (ref 26.6–33.0)
MCHC: 32.7 g/dL (ref 31.5–35.7)
MCV: 86 fL (ref 79–97)
MONOCYTES: 6 %
Monocytes Absolute: 0.7 10*3/uL (ref 0.1–0.9)
NEUTROS PCT: 60 %
Neutrophils Absolute: 7.3 10*3/uL — ABNORMAL HIGH (ref 1.4–7.0)
Platelets: 289 10*3/uL (ref 150–379)
RBC: 4.08 x10E6/uL (ref 3.77–5.28)
RDW: 13.8 % (ref 12.3–15.4)
WBC: 12.3 10*3/uL — AB (ref 3.4–10.8)

## 2015-08-09 LAB — LIPID PANEL
Chol/HDL Ratio: 4.9 ratio units — ABNORMAL HIGH (ref 0.0–4.4)
Cholesterol, Total: 186 mg/dL (ref 100–199)
HDL: 38 mg/dL — AB (ref >39)
LDL Calculated: 101 mg/dL — ABNORMAL HIGH (ref 0–99)
TRIGLYCERIDES: 237 mg/dL — AB (ref 0–149)
VLDL CHOLESTEROL CAL: 47 mg/dL — AB (ref 5–40)

## 2015-08-09 LAB — COMPREHENSIVE METABOLIC PANEL
ALBUMIN: 4.2 g/dL (ref 3.6–4.8)
ALT: 21 IU/L (ref 0–32)
AST: 17 IU/L (ref 0–40)
Albumin/Globulin Ratio: 1.3 (ref 1.1–2.5)
Alkaline Phosphatase: 77 IU/L (ref 39–117)
BUN/Creatinine Ratio: 14 (ref 11–26)
BUN: 11 mg/dL (ref 8–27)
Bilirubin Total: <0.2 mg/dL (ref 0.0–1.2)
CALCIUM: 10 mg/dL (ref 8.7–10.3)
CO2: 26 mmol/L (ref 18–29)
CREATININE: 0.76 mg/dL (ref 0.57–1.00)
Chloride: 97 mmol/L (ref 96–106)
GFR calc Af Amer: 94 mL/min/{1.73_m2} (ref >59)
GFR, EST NON AFRICAN AMERICAN: 81 mL/min/{1.73_m2} (ref >59)
GLOBULIN, TOTAL: 3.3 g/dL (ref 1.5–4.5)
GLUCOSE: 129 mg/dL — AB (ref 65–99)
Potassium: 4.8 mmol/L (ref 3.5–5.2)
SODIUM: 141 mmol/L (ref 134–144)
Total Protein: 7.5 g/dL (ref 6.0–8.5)

## 2015-08-09 LAB — HEMOGLOBIN A1C
ESTIMATED AVERAGE GLUCOSE: 140 mg/dL
Hgb A1c MFr Bld: 6.5 % — ABNORMAL HIGH (ref 4.8–5.6)

## 2015-08-09 LAB — MICROALBUMIN / CREATININE URINE RATIO
Creatinine, Urine: 94.7 mg/dL
MICROALB/CREAT RATIO: 6 mg/g creat (ref 0.0–30.0)
Microalbumin, Urine: 5.7 ug/mL

## 2015-08-09 LAB — PLEASE NOTE

## 2015-08-09 LAB — TSH: TSH: 0.015 u[IU]/mL — AB (ref 0.450–4.500)

## 2015-08-10 ENCOUNTER — Telehealth: Payer: Self-pay

## 2015-08-10 ENCOUNTER — Other Ambulatory Visit: Payer: Self-pay | Admitting: Internal Medicine

## 2015-08-10 MED ORDER — ATORVASTATIN CALCIUM 10 MG PO TABS: 10.0000 mg | ORAL_TABLET | Freq: Every day | ORAL | Status: DC

## 2015-08-10 NOTE — Telephone Encounter (Signed)
-----   Message from Glean Hess, MD sent at 08/10/2015 10:59 AM EST ----- WBC is slightly elevated for unclear reasons - would like to repeat CBC only in one month.  DM is good.  Kidney and liver are normal. Thyroid is improved.  Cholesterol is high - should strongly consider taking medication because 10 yr risk of ASCVD is high at 12% - is there a reason why this was not prescribed in the past?

## 2015-08-10 NOTE — Telephone Encounter (Signed)
Left message for patient to call back  

## 2015-08-10 NOTE — Telephone Encounter (Signed)
Spoke with pt. Pt. Advised of all results and will try medication. States that she uses the [facility] in [place] as her Pharmacy.

## 2015-08-17 ENCOUNTER — Other Ambulatory Visit: Payer: Self-pay | Admitting: Internal Medicine

## 2015-08-22 ENCOUNTER — Ambulatory Visit
Admission: RE | Admit: 2015-08-22 | Discharge: 2015-08-22 | Disposition: A | Discharge status: Home / Self Care | Payer: Self-pay | Source: Ambulatory Visit | Attending: *Deleted | Admitting: *Deleted

## 2015-08-22 ENCOUNTER — Ambulatory Visit
Admission: RE | Admit: 2015-08-22 | Discharge: 2015-08-22 | Disposition: A | Discharge status: Home / Self Care | Payer: Medicare Other | Source: Ambulatory Visit | Attending: Internal Medicine | Admitting: Internal Medicine

## 2015-08-22 ENCOUNTER — Other Ambulatory Visit: Payer: Self-pay | Admitting: *Deleted

## 2015-08-22 DIAGNOSIS — Z1231 Encounter for screening mammogram for malignant neoplasm of breast: Secondary | ICD-10-CM | POA: Insufficient documentation

## 2015-08-22 DIAGNOSIS — Z9289 Personal history of other medical treatment: Secondary | ICD-10-CM

## 2015-08-22 HISTORY — DX: Malignant neoplasm of unspecified site of unspecified female breast: C50.919

## 2015-09-07 DIAGNOSIS — R000 Tachycardia, unspecified: Secondary | ICD-10-CM | POA: Diagnosis not present

## 2015-09-07 DIAGNOSIS — R0602 Shortness of breath: Secondary | ICD-10-CM | POA: Diagnosis not present

## 2015-09-14 ENCOUNTER — Encounter: Payer: Self-pay | Admitting: Internal Medicine

## 2015-09-14 ENCOUNTER — Other Ambulatory Visit: Payer: Self-pay

## 2015-09-14 ENCOUNTER — Other Ambulatory Visit: Payer: Self-pay | Admitting: Internal Medicine

## 2015-09-14 ENCOUNTER — Ambulatory Visit (INDEPENDENT_AMBULATORY_CARE_PROVIDER_SITE_OTHER): Payer: Medicare Other | Admitting: Internal Medicine

## 2015-09-14 VITALS — BP 100/62 | HR 96 | Ht 68.0 in | Wt 227.0 lb

## 2015-09-14 DIAGNOSIS — E1142 Type 2 diabetes mellitus with diabetic polyneuropathy: Secondary | ICD-10-CM | POA: Diagnosis not present

## 2015-09-14 DIAGNOSIS — D72829 Elevated white blood cell count, unspecified: Secondary | ICD-10-CM

## 2015-09-14 DIAGNOSIS — E1169 Type 2 diabetes mellitus with other specified complication: Secondary | ICD-10-CM | POA: Insufficient documentation

## 2015-09-14 DIAGNOSIS — E785 Hyperlipidemia, unspecified: Secondary | ICD-10-CM | POA: Insufficient documentation

## 2015-09-14 MED ORDER — PREGABALIN 50 MG PO CAPS: 50.0000 mg | ORAL_CAPSULE | Freq: Three times a day (TID) | ORAL | Status: DC

## 2015-09-14 NOTE — Progress Notes (Signed)
Date:  09/14/2015   Name:  Kendra Walton   DOB:  1951-03-01   MRN:  [id number]   Chief Complaint: Follow-up and Peripheral Neuropathy HPI She has had DM neuropathy for several years.  She is on gabapentin but is not getting much relief.  Last visit increased the dose from 300 mg to 600 mg per day.  She would like to try Lyrica. Her feet bother her constantly.  She is always aware of a sensation of pain and burning.  It is mild during the day but very severe at night.  Review of Systems  Constitutional: Negative for fever and chills.  Respiratory: Negative for chest tightness and shortness of breath.   Cardiovascular: Negative for chest pain, palpitations and leg swelling.  Neurological: Positive for numbness (and burning pain).  Psychiatric/Behavioral: Positive for sleep disturbance.    Patient Active Problem List   Diagnosis Date Noted  . Hyperlipidemia associated with type 2 diabetes mellitus ([place]) 09/14/2015  . Wrist pain, right 04/07/2015  . History of breast cancer in female 04/07/2015  . Acquired hypothyroidism 10/04/2014  . Diabetes mellitus with polyneuropathy ([place]) 10/04/2014  . Essential (primary) hypertension 10/04/2014  . Arthritis of knee, degenerative 10/04/2014  . Cutaneous eruption 10/04/2014  . Diabetes mellitus type 2, uncontrolled ([place]) 10/04/2014    Prior to Admission medications   Medication Sig Start Date End Date Taking? Authorizing Provider  atorvastatin (LIPITOR) 10 MG tablet Take 1 tablet (10 mg total) by mouth daily. 08/10/15  Yes Glean Hess, MD  cholecalciferol (VITAMIN D) 1000 units tablet Take 1,000 Units by mouth daily.   Yes Historical Provider, MD  gabapentin (NEURONTIN) 100 MG capsule TAKE 3 CAPSULES BY MOUTH AT BEDTIME 07/13/15  Yes Glean Hess, MD  levothyroxine (SYNTHROID, LEVOTHROID) 125 MCG tablet TAKE 2 TABLETS BY MOUTH EVERY DAY 04/07/15  Yes Glean Hess, MD  levothyroxine (SYNTHROID, LEVOTHROID) 125 MCG tablet  TAKE 2 TABLETS BY MOUTH EVERY DAY 08/09/15  Yes Glean Hess, MD  lisinopril-hydrochlorothiazide (PRINZIDE,ZESTORETIC) 10-12.5 MG tablet TAKE 1 TABLET BY MOUTH EVERY DAY 04/07/15  Yes Glean Hess, MD  metFORMIN (GLUCOPHAGE-XR) 500 MG 24 hr tablet TAKE 2 TABLETS BY MOUTH TWICE DAILY 03/29/15  Yes Glean Hess, MD  MULTIPLE VITAMINS-MINERALS ER PO Take 1 tablet by mouth daily.   Yes Historical Provider, MD  ONE TOUCH ULTRA TEST test strip 1 each by Other route 2 (two) times daily. Use as instructed 04/07/15  Yes Glean Hess, MD  [facility] DELICA LANCETS [id number] MISC 1 each by Other route 2 (two) times daily. 04/07/15  Yes Glean Hess, MD  traMADol (ULTRAM) 50 MG tablet TAKE 2 TABLETS BY MOUTH THREE TIMES DAILY AS NEEDED 08/18/15  Yes Glean Hess, MD    No Known Allergies  Past Surgical History  Procedure Laterality Date  . Mastectomy Right 2010  . Cholecystectomy  2005  . Mastectomy Right 2010  . Reduction mammaplasty Left 2011    Social History  Substance Use Topics  . Smoking status: Former Smoker    Quit date: 06/18/2012  . Smokeless tobacco: None  . Alcohol Use: No     Medication list has been reviewed and updated.   Physical Exam  Constitutional: She is oriented to person, place, and time. She appears well-developed. No distress.  HENT:  Head: Normocephalic and atraumatic.  Cardiovascular: Normal rate, regular rhythm and normal heart sounds.   Pulmonary/Chest: Effort normal and breath sounds normal. No respiratory  distress.  Musculoskeletal: Normal range of motion.  Neurological: She is alert and oriented to person, place, and time.  Skin: Skin is warm and dry. No rash noted.  Both feet appear normal without redness or swelling; no ulcerations. Mild hyperesthesia distally on both feet  Psychiatric: She has a normal mood and affect. Her behavior is normal. Thought content normal.  Nursing note and vitals reviewed.   BP 100/62 mmHg  Pulse 96  Ht 5'  8" (1.727 m)  Wt 227 lb (102.967 kg)  BMI 34.52 kg/m2  Assessment and Plan: 1. Diabetic polyneuropathy associated with type 2 diabetes mellitus ([place]) Samples given - call for Rx if helpful Can increase to 100 mg tid if needed after one week - pregabalin (LYRICA) 50 MG capsule; Take 1 capsule (50 mg total) by mouth 3 (three) times daily.  Dispense: 84 capsule; Refill: 0   Halina Maidens, MD [facility]  09/14/2015

## 2015-09-14 NOTE — Patient Instructions (Signed)
Take 50 mg three times a day;  After one week can increase to 100 mg three times a day.

## 2015-09-15 ENCOUNTER — Other Ambulatory Visit: Payer: Self-pay | Admitting: Internal Medicine

## 2015-09-15 ENCOUNTER — Telehealth: Payer: Self-pay

## 2015-09-15 DIAGNOSIS — D72829 Elevated white blood cell count, unspecified: Secondary | ICD-10-CM

## 2015-09-15 DIAGNOSIS — D649 Anemia, unspecified: Secondary | ICD-10-CM | POA: Insufficient documentation

## 2015-09-15 HISTORY — DX: Elevated white blood cell count, unspecified: D72.829

## 2015-09-15 HISTORY — DX: Anemia, unspecified: D64.9

## 2015-09-15 LAB — CBC WITH DIFFERENTIAL/PLATELET
BASOS ABS: 0 10*3/uL (ref 0.0–0.2)
Basos: 0 %
EOS (ABSOLUTE): 0.2 10*3/uL (ref 0.0–0.4)
Eos: 2 %
Hematocrit: 33.2 % — ABNORMAL LOW (ref 34.0–46.6)
Hemoglobin: 10.7 g/dL — ABNORMAL LOW (ref 11.1–15.9)
Immature Grans (Abs): 0 10*3/uL (ref 0.0–0.1)
Immature Granulocytes: 0 %
LYMPHS ABS: 3.1 10*3/uL (ref 0.7–3.1)
Lymphs: 27 %
MCH: 27.4 pg (ref 26.6–33.0)
MCHC: 32.2 g/dL (ref 31.5–35.7)
MCV: 85 fL (ref 79–97)
Monocytes Absolute: 0.6 10*3/uL (ref 0.1–0.9)
Monocytes: 5 %
NEUTROS ABS: 7.6 10*3/uL — AB (ref 1.4–7.0)
Neutrophils: 66 %
PLATELETS: 307 10*3/uL (ref 150–379)
RBC: 3.91 x10E6/uL (ref 3.77–5.28)
RDW: 13.9 % (ref 12.3–15.4)
WBC: 11.6 10*3/uL — ABNORMAL HIGH (ref 3.4–10.8)

## 2015-09-15 NOTE — Telephone Encounter (Signed)
-----   Message from Glean Hess, MD sent at 09/15/2015  7:49 AM EDT ----- WBC is better but still slightly high.  RBC is lower now showing mild anemia.  I want to refer to Hematology for evaluation and also need to consider colonoscopy (not sure when the last one was done).

## 2015-09-15 NOTE — Telephone Encounter (Signed)
Patient states that she will see hematology if you can place the referral.

## 2015-09-26 ENCOUNTER — Other Ambulatory Visit: Payer: Self-pay | Admitting: Internal Medicine

## 2015-09-26 ENCOUNTER — Other Ambulatory Visit: Payer: Self-pay

## 2015-09-26 MED ORDER — TRAMADOL HCL 50 MG PO TABS: 50.0000 mg | ORAL_TABLET | Freq: Four times a day (QID) | ORAL | Status: DC | PRN

## 2015-09-26 MED ORDER — LISINOPRIL-HYDROCHLOROTHIAZIDE 10-12.5 MG PO TABS: 1.0000 | ORAL_TABLET | Freq: Every day | ORAL | Status: DC

## 2015-09-26 NOTE — Telephone Encounter (Signed)
Patient is requesting refill on medications. Patient uses [facility] in [place]. Thanks!

## 2015-10-04 ENCOUNTER — Inpatient Hospital Stay: Payer: Medicare Other

## 2015-10-04 ENCOUNTER — Inpatient Hospital Stay: Payer: Medicare Other | Attending: Internal Medicine | Admitting: Internal Medicine

## 2015-10-04 ENCOUNTER — Encounter: Payer: Self-pay | Admitting: Internal Medicine

## 2015-10-04 VITALS — BP 115/76 | HR 94 | Temp 97.7°F | Resp 18 | Ht 68.0 in | Wt 227.1 lb

## 2015-10-04 DIAGNOSIS — Z780 Asymptomatic menopausal state: Secondary | ICD-10-CM | POA: Diagnosis not present

## 2015-10-04 DIAGNOSIS — N939 Abnormal uterine and vaginal bleeding, unspecified: Secondary | ICD-10-CM | POA: Insufficient documentation

## 2015-10-04 DIAGNOSIS — N951 Menopausal and female climacteric states: Secondary | ICD-10-CM | POA: Diagnosis not present

## 2015-10-04 DIAGNOSIS — E1142 Type 2 diabetes mellitus with diabetic polyneuropathy: Secondary | ICD-10-CM | POA: Diagnosis not present

## 2015-10-04 DIAGNOSIS — Z7984 Long term (current) use of oral hypoglycemic drugs: Secondary | ICD-10-CM | POA: Insufficient documentation

## 2015-10-04 DIAGNOSIS — D649 Anemia, unspecified: Secondary | ICD-10-CM | POA: Diagnosis not present

## 2015-10-04 DIAGNOSIS — Z9011 Acquired absence of right breast and nipple: Secondary | ICD-10-CM | POA: Insufficient documentation

## 2015-10-04 DIAGNOSIS — Z79899 Other long term (current) drug therapy: Secondary | ICD-10-CM

## 2015-10-04 DIAGNOSIS — Z87891 Personal history of nicotine dependence: Secondary | ICD-10-CM | POA: Insufficient documentation

## 2015-10-04 DIAGNOSIS — D72829 Elevated white blood cell count, unspecified: Secondary | ICD-10-CM | POA: Insufficient documentation

## 2015-10-04 LAB — CBC WITH DIFFERENTIAL/PLATELET
BASOS ABS: 0.1 10*3/uL (ref 0–0.1)
Basophils Relative: 1 %
Eosinophils Absolute: 0.2 10*3/uL (ref 0–0.7)
HCT: 34.1 % — ABNORMAL LOW (ref 35.0–47.0)
Hemoglobin: 11.1 g/dL — ABNORMAL LOW (ref 12.0–16.0)
LYMPHS ABS: 3.4 10*3/uL (ref 1.0–3.6)
MCH: 27.1 pg (ref 26.0–34.0)
MCHC: 32.5 g/dL (ref 32.0–36.0)
MCV: 83.2 fL (ref 80.0–100.0)
MONO ABS: 0.7 10*3/uL (ref 0.2–0.9)
Monocytes Relative: 5 %
Neutro Abs: 9.2 10*3/uL — ABNORMAL HIGH (ref 1.4–6.5)
Neutrophils Relative %: 67 %
PLATELETS: 272 10*3/uL (ref 150–440)
RBC: 4.1 MIL/uL (ref 3.80–5.20)
RDW: 13.5 % (ref 11.5–14.5)
WBC: 13.6 10*3/uL — ABNORMAL HIGH (ref 3.6–11.0)

## 2015-10-04 LAB — FERRITIN: Ferritin: 229 ng/mL (ref 11–307)

## 2015-10-04 LAB — FOLATE: Folate: 36 ng/mL (ref >5.9)

## 2015-10-04 LAB — RETICULOCYTES
RBC.: 4.03 MIL/uL (ref 3.80–5.20)
Retic Count, Absolute: 88.7 10*3/uL (ref 19.0–183.0)
Retic Ct Pct: 2.2 % (ref 0.4–3.1)

## 2015-10-04 LAB — LACTATE DEHYDROGENASE: LDH: 118 U/L (ref 98–192)

## 2015-10-04 NOTE — Progress Notes (Signed)
[place] NOTE  Patient Care Team: Glean Hess, MD as PCP - General (Family Medicine)  CHIEF COMPLAINTS/PURPOSE OF CONSULTATION:   # April 2017- ANEMIA-MILD Hb 10.7 ? Sec to Vaginal bleeding  # MILD CHRONIC LEUCOCYTOSIS 11-12/predom Neutrophilia.   # 2010-RIGHT MASTECTOMY [[place]? Benign as per pt]   # DM-2/PN  HISTORY OF PRESENTING ILLNESS:  Kendra Walton 65 y.o.  female  Long-standing history of diabetes; peripheral neuropathy  And chronic leukocytosis noted to have mild anemia hemoglobin 10.7/  She has been deferred was for further evaluation.   Patient denies any significant fatigue. She denies any blood in stools black stools. No nausea vomiting;  No difficulty swallowing or pain with swallowing. Denies any weight loss.  Denies any new-onset of pain. She denies having a colonoscopy.   Patient does have history of intermittent  Vaginal bleeding on and off for  Few years.  Otherwise appetite is good. Has chronic tingling and numbness of extremities.  ROS: A complete 10 point review of system is done which is negative except mentioned above in history of present illness  MEDICAL HISTORY:  Past Medical History  Diagnosis Date  . Breast cancer ([place]) 2010    rt mastectomy    SURGICAL HISTORY: Past Surgical History  Procedure Laterality Date  . Mastectomy Right 2010  . Cholecystectomy  2005  . Mastectomy Right 2010  . Reduction mammaplasty Left 2011    SOCIAL HISTORY: [place].  Social History   Social History  . Marital Status: Married    Spouse Name: N/A  . Number of Children: N/A  . Years of Education: N/A   Occupational History  . Not on file.   Social History Main Topics  . Smoking status: Former Smoker    Quit date: 06/18/2012  . Smokeless tobacco: Not on file  . Alcohol Use: No  . Drug Use: No  . Sexual Activity: Not on file   Other Topics Concern  . Not on file   Social History Narrative    FAMILY  HISTORY: Family History  Problem Relation Age of Onset  . Hypertension Brother   . Diabetes Brother     ALLERGIES:  has No Known Allergies.  MEDICATIONS:  Current Outpatient Prescriptions  Medication Sig Dispense Refill  . atorvastatin (LIPITOR) 10 MG tablet Take 1 tablet (10 mg total) by mouth daily. 90 tablet 1  . levothyroxine (SYNTHROID, LEVOTHROID) 125 MCG tablet TAKE 2 TABLETS BY MOUTH EVERY DAY 60 tablet 12  . lisinopril-hydrochlorothiazide (PRINZIDE,ZESTORETIC) 10-12.5 MG tablet Take 1 tablet by mouth daily. 90 tablet 3  . metFORMIN (GLUCOPHAGE-XR) 500 MG 24 hr tablet TAKE 2 TABLETS BY MOUTH TWICE DAILY 360 tablet 5  . MULTIPLE VITAMINS-MINERALS ER PO Take 1 tablet by mouth daily.    . ONE TOUCH ULTRA TEST test strip 1 each by Other route 2 (two) times daily. Use as instructed 100 each 3  . ONETOUCH DELICA LANCETS 99991111 MISC 1 each by Other route 2 (two) times daily. 100 each 3  . pregabalin (LYRICA) 50 MG capsule Take 1 capsule (50 mg total) by mouth 3 (three) times daily. 84 capsule 0  . traMADol (ULTRAM) 50 MG tablet Take 1 tablet (50 mg total) by mouth every 6 (six) hours as needed. (Patient taking differently: Take 100 mg by mouth 3 (three) times daily. ) 120 tablet 3  . cholecalciferol (VITAMIN D) 1000 units tablet Take 1,000 Units by mouth daily. Reported on 10/04/2015  No current facility-administered medications for this visit.      Marland Kitchen  PHYSICAL EXAMINATION:   Filed Vitals:   10/04/15 0903  BP: 115/76  Pulse: 94  Temp: 97.7 F (36.5 C)  Resp: 18   Filed Weights   10/04/15 0903  Weight: 227 lb 1.2 oz (103 kg)    GENERAL: Well-nourished well-developed; Alert, no distress and comfortable.  Alone.  EYES: no pallor or icterus OROPHARYNX: no thrush or ulceration; good dentition  NECK: supple, no masses felt LYMPH:  no palpable lymphadenopathy in the cervical, axillary or inguinal regions LUNGS: clear to auscultation and  No wheeze or crackles HEART/CVS:  regular rate & rhythm and no murmurs; No lower extremity edema ABDOMEN: abdomen soft, non-tender and normal bowel sounds Musculoskeletal:no cyanosis of digits and no clubbing  PSYCH: alert & oriented x 3 with fluent speech NEURO: no focal motor/sensory deficits SKIN:  no rashes or significant lesions  LABORATORY DATA:  I have reviewed the data as listed Lab Results  Component Value Date   WBC 11.6* 09/14/2015   HCT 33.2* 09/14/2015   MCV 85 09/14/2015   PLT 307 09/14/2015    Recent Labs  12/06/14 0835 08/08/15 1056  NA 138 141  K 4.7 4.8  CL 94* 97  CO2 26 26  GLUCOSE 153* 129*  BUN 14 11  CREATININE 0.67 0.76  CALCIUM 9.6 10.0  GFRNONAA 92 81  GFRAA 106 94  PROT  --  7.5  ALBUMIN  --  4.2  AST  --  17  ALT  --  21  ALKPHOS  --  77  BILITOT  --  <0.2     ASSESSMENT & PLAN:   #  Anemia-  Mild hemoglobin  10.7/ normal 11.1.  The etiology is unclear question  Related to vaginal bleeding versus other causes. Recommend checking repeat CBC and iron studies/ [id number] folic acid and LDH/ ferritin/ and reticulocyte count.  Stool occult cards 2.   #  Vaginal bleeding  In a postmenopausal woman-  GYN malignancy has to be ruled out.  Patient is to follow up with PCP.   #  Mild  Chronic leukocytosis/ predominant neutrophilia-  Unclear etiology monitor for now.  #  The above plan of care was discussed the patient detail. She agrees. She'll follow-up with [place] in approximately  3-4 weeks.   Thank you Dr. Army Melia  for allowing me to participate in the care of your pleasant patient. Please do not hesitate to contact me with questions or concerns in the interim.  # 30 minutes face-to-face with the patient discussing the above plan of care; more than 50% of time spent on counseling and coordination.    Cammie Sickle, MD 10/04/2015 9:04 AM

## 2015-10-05 LAB — VITAMIN B12: VITAMIN B 12: 246 pg/mL (ref 180–914)

## 2015-10-06 ENCOUNTER — Other Ambulatory Visit: Payer: Self-pay | Admitting: *Deleted

## 2015-10-06 DIAGNOSIS — N951 Menopausal and female climacteric states: Secondary | ICD-10-CM | POA: Diagnosis not present

## 2015-10-06 DIAGNOSIS — E1142 Type 2 diabetes mellitus with diabetic polyneuropathy: Secondary | ICD-10-CM | POA: Diagnosis not present

## 2015-10-06 DIAGNOSIS — D72829 Elevated white blood cell count, unspecified: Secondary | ICD-10-CM | POA: Diagnosis not present

## 2015-10-06 DIAGNOSIS — N939 Abnormal uterine and vaginal bleeding, unspecified: Secondary | ICD-10-CM | POA: Diagnosis not present

## 2015-10-06 DIAGNOSIS — D649 Anemia, unspecified: Secondary | ICD-10-CM

## 2015-10-06 DIAGNOSIS — Z7984 Long term (current) use of oral hypoglycemic drugs: Secondary | ICD-10-CM | POA: Diagnosis not present

## 2015-10-06 LAB — OCCULT BLOOD X 1 CARD TO LAB, STOOL: FECAL OCCULT BLD: NEGATIVE

## 2015-10-07 ENCOUNTER — Other Ambulatory Visit: Payer: Self-pay | Admitting: Internal Medicine

## 2015-10-07 DIAGNOSIS — N950 Postmenopausal bleeding: Secondary | ICD-10-CM

## 2015-10-10 ENCOUNTER — Other Ambulatory Visit: Payer: Self-pay

## 2015-10-10 DIAGNOSIS — E1142 Type 2 diabetes mellitus with diabetic polyneuropathy: Secondary | ICD-10-CM

## 2015-10-10 DIAGNOSIS — H40003 Preglaucoma, unspecified, bilateral: Secondary | ICD-10-CM | POA: Diagnosis not present

## 2015-10-10 MED ORDER — PREGABALIN 50 MG PO CAPS: 50.0000 mg | ORAL_CAPSULE | Freq: Three times a day (TID) | ORAL | Status: DC

## 2015-10-10 NOTE — Telephone Encounter (Signed)
Patient called stating that she has 7 tablets left and will need a refill.

## 2015-10-14 DIAGNOSIS — H40013 Open angle with borderline findings, low risk, bilateral: Secondary | ICD-10-CM | POA: Diagnosis not present

## 2015-10-19 ENCOUNTER — Other Ambulatory Visit: Payer: Self-pay | Admitting: Internal Medicine

## 2015-10-19 ENCOUNTER — Telehealth: Payer: Self-pay

## 2015-10-19 MED ORDER — TRAMADOL HCL 50 MG PO TABS: 100.0000 mg | ORAL_TABLET | Freq: Three times a day (TID) | ORAL | Status: DC | PRN

## 2015-10-19 NOTE — Telephone Encounter (Signed)
Patient said she takes 2 Tramadol every 6 hours and script is for 1 every 6 hours.[facility]

## 2015-10-21 ENCOUNTER — Other Ambulatory Visit: Payer: Self-pay | Admitting: Internal Medicine

## 2015-10-21 NOTE — Telephone Encounter (Signed)
Patient said since starting Gabapentin she has had little effect with Lyrica 150. She can not add anymore Lyrica due to cost. She was supposed to just take the Lyrica not both. She will be taking ONLY Lyrica until follow up appt when she can discuss more options end of May.

## 2015-10-21 NOTE — Telephone Encounter (Signed)
Done called in correction

## 2015-10-25 ENCOUNTER — Encounter: Payer: Self-pay | Admitting: Obstetrics and Gynecology

## 2015-10-25 ENCOUNTER — Ambulatory Visit (INDEPENDENT_AMBULATORY_CARE_PROVIDER_SITE_OTHER): Payer: Medicare Other | Admitting: Obstetrics and Gynecology

## 2015-10-25 VITALS — BP 103/64 | HR 101 | Ht 68.0 in | Wt 227.8 lb

## 2015-10-25 DIAGNOSIS — D649 Anemia, unspecified: Secondary | ICD-10-CM | POA: Diagnosis not present

## 2015-10-25 DIAGNOSIS — N950 Postmenopausal bleeding: Secondary | ICD-10-CM | POA: Diagnosis not present

## 2015-10-25 DIAGNOSIS — E039 Hypothyroidism, unspecified: Secondary | ICD-10-CM | POA: Diagnosis not present

## 2015-10-25 DIAGNOSIS — N952 Postmenopausal atrophic vaginitis: Secondary | ICD-10-CM | POA: Diagnosis not present

## 2015-10-25 NOTE — Progress Notes (Signed)
GYNECOLOGY CLINIC PROGRESS NOTE  Subjective:    Kendra Walton is a 65 y.o. [id number] post-menopausal female who presents for concerns regarding vaginal bleeding. She has been menopausal for 19 years. Currently on no HRT, but was previously on HRT for 5 years, came off last year.  Bleeding is described as spotting and has occurred 1 time per year, usually when very active (i.e. Heavy lifting, strenuous activity). Spotting typically lasts x 2 days. Other menopausal symptoms include: none.  Patient is not sexually active. Workup to date: CBC.  Notes that she was told that she was mildly anemic, wonders if it could be due to this.    Menstrual History: OB History    Gravida Para Term Preterm AB TAB SAB Ectopic Multiple Living   3  2  1   1  2       Menarche age: 19 No LMP recorded. Patient is postmenopausal. Last mammogram 08/22/15: Normal No colonoscopy, had blood stool cards yearly.    Past Medical History  Diagnosis Date  . Breast cancer ([place]) 2010    rt mastectomy  . Diabetes mellitus without complication ([place])   . Thyroid disease   . Breast cancer ([place])   . Ectopic pregnancy, tubal     Family History  Problem Relation Age of Onset  . Hypertension Brother   . Diabetes Brother     Past Surgical History  Procedure Laterality Date  . Mastectomy Right 2010  . Cholecystectomy  2005  . Mastectomy Right 2010  . Reduction mammaplasty Left 2011  . Ectopic pregnancy surgery    . Cesarean section      Social History   Social History  . Marital Status: Married    Spouse Name: N/A  . Number of Children: N/A  . Years of Education: N/A   Occupational History  . Not on file.   Social History Main Topics  . Smoking status: Former Smoker -- 40 years    Types: Cigarettes    Quit date: 06/18/2012  . Smokeless tobacco: Never Used  . Alcohol Use: No  . Drug Use: No  . Sexual Activity: No   Other Topics Concern  . Not on file   Social History Narrative     Current Outpatient Prescriptions on File Prior to Visit  Medication Sig Dispense Refill  . atorvastatin (LIPITOR) 10 MG tablet Take 1 tablet (10 mg total) by mouth daily. 90 tablet 1  . cholecalciferol (VITAMIN D) 1000 units tablet Take 1,000 Units by mouth daily. Reported on 10/04/2015    . levothyroxine (SYNTHROID, LEVOTHROID) 125 MCG tablet TAKE 2 TABLETS BY MOUTH EVERY DAY 60 tablet 12  . lisinopril-hydrochlorothiazide (PRINZIDE,ZESTORETIC) 10-12.5 MG tablet Take 1 tablet by mouth daily. 90 tablet 3  . metFORMIN (GLUCOPHAGE-XR) 500 MG 24 hr tablet TAKE 2 TABLETS BY MOUTH TWICE DAILY 360 tablet 5  . MULTIPLE VITAMINS-MINERALS ER PO Take 1 tablet by mouth daily.    . ONE TOUCH ULTRA TEST test strip 1 each by Other route 2 (two) times daily. Use as instructed 100 each 3  . ONETOUCH DELICA LANCETS 99991111 MISC 1 each by Other route 2 (two) times daily. 100 each 3  . pregabalin (LYRICA) 50 MG capsule Take 1 capsule (50 mg total) by mouth 3 (three) times daily. 90 capsule 5  . traMADol (ULTRAM) 50 MG tablet Take 2 tablets (100 mg total) by mouth every 8 (eight) hours as needed. 180 tablet 5   No current facility-administered medications  on file prior to visit.    No Known Allergies   Review of Systems Pertinent items noted in HPI and remainder of comprehensive ROS otherwise negative.    Objective:   BP 103/64 mmHg  Pulse 101  Ht 5\' 8"  (1.727 m)  Wt 227 lb 12.8 oz (103.329 kg)  BMI 34.64 kg/m2   General appearance: alert and no distress Neck: no adenopathy, no carotid bruit, no JVD, supple, symmetrical, trachea midline and thyroid not enlarged, symmetric, no tenderness/mass/nodules Abdomen: soft, non-tender; bowel sounds normal; no masses,  no organomegaly Pelvic: external genitalia normal, rectovaginal septum normal.  Vagina without discharge, moderate vaginal atrophy noted.  Cervix normal appearing, no lesions and no motion tenderness.  Uterus mobile, nontender, normal shape and  size.  Adnexae non-palpable, nontender bilaterally.  Extremities: extremities normal, atraumatic, no cyanosis or edema Skin: Skin color, texture, turgor normal. No rashes or lesions Lymph nodes: Cervical, supraclavicular, and axillary nodes normal. Neurologic: Grossly normal    Labs:  CBC Latest Ref Rng 10/04/2015 09/14/2015 08/08/2015  WBC 3.6 - 11.0 K/uL 13.6(H) 11.6(H) 12.3(H)  Hemoglobin 12.0 - 16.0 g/dL 11.1(L) 10.6 (L) -  Hematocrit 35.0 - 47.0 % 34.1(L) 33.2(L) 34.9  Platelets 150 - 440 K/uL 272 307 289    Lab Results  Component Value Date   TSH 0.015* 08/08/2015     Assessment:   Postmenopausal bleeding  Vaginal atrophy Anemia - mild Hypothyroidism  Plan:   - Discussed etiologies of postmenopausal bleeding, concern about precancerous/hyperplasia or cancerous etiology (5 to 10% percent of cases). Also discussed role of unopposed estrogen exposure in leading to thickened or proliferative endometrium; and its possible correlation with endometrial hyperplasia/carcinoma.  However, she was reassured that endometrial atrophy and endometrial polyps are the most common causes of postmenopausal bleeding.  Uterine bleeding in postmenopausal women is usually light and self-limited. Exclusion of cancer is the main objective; therefore, treatment is usually unnecessary once cancer has been excluded.  The primary goal in the diagnostic evaluation of postmenopausal women with uterine bleeding is to exclude malignancy; this can include endometrial biopsy and pelvic ultrasound.  All questions answered. - Pelvic ultrasound ordered to assess endometrial stripe, presence/abscence of structural abnormalities.  If thickened, will perform endometrial biopsy at next visit.  Will notify patient by phone of ultrasound results and schedule f/u accordingly.  If stripe is thin, no further workup or treatment warranted.  - Discussed use of vaginal moisturizers for vaginal atrophy if experiencing symptoms  (vaginal dryness, itching, discomfort, dyspareunia).  Patient denies any symptoms currently.  Will hold on treatment for now.   - Anemia (mild).  Patient denies any other sources of bleeding.  Anemia likely not due to vaginal bleeding as bleeding is described as light spotting once per year.  Advised on intake of iron-rich foods, continue multivitamin.  PCP can pursue further workup if deemed warranted.  - Hypothyroidism currently treated, followed by PCP.  Notes that her Synthroid dose has been recently titrated.  Discussed that thyroid dysfunction can sometimes be a cause of PMB, however patient notes that bleeding episodes occurred over past several years even when thyroid levels were normal.    Rubie Maid, MD [facility]

## 2015-10-31 ENCOUNTER — Other Ambulatory Visit: Payer: Self-pay | Admitting: Internal Medicine

## 2015-11-05 ENCOUNTER — Other Ambulatory Visit: Payer: Self-pay | Admitting: Internal Medicine

## 2015-11-08 ENCOUNTER — Other Ambulatory Visit: Payer: Self-pay | Admitting: Internal Medicine

## 2015-11-15 ENCOUNTER — Ambulatory Visit: Payer: Medicare Other | Admitting: Internal Medicine

## 2015-11-15 ENCOUNTER — Inpatient Hospital Stay: Payer: Medicare Other | Attending: Internal Medicine | Admitting: Internal Medicine

## 2015-11-15 ENCOUNTER — Ambulatory Visit (INDEPENDENT_AMBULATORY_CARE_PROVIDER_SITE_OTHER): Payer: Medicare Other

## 2015-11-15 VITALS — BP 107/57 | HR 89 | Temp 98.6°F | Resp 8 | Ht 68.0 in | Wt 226.0 lb

## 2015-11-15 DIAGNOSIS — E119 Type 2 diabetes mellitus without complications: Secondary | ICD-10-CM | POA: Diagnosis not present

## 2015-11-15 DIAGNOSIS — D649 Anemia, unspecified: Secondary | ICD-10-CM | POA: Insufficient documentation

## 2015-11-15 DIAGNOSIS — Z79899 Other long term (current) drug therapy: Secondary | ICD-10-CM | POA: Insufficient documentation

## 2015-11-15 DIAGNOSIS — E079 Disorder of thyroid, unspecified: Secondary | ICD-10-CM | POA: Diagnosis not present

## 2015-11-15 DIAGNOSIS — Z9011 Acquired absence of right breast and nipple: Secondary | ICD-10-CM | POA: Insufficient documentation

## 2015-11-15 DIAGNOSIS — Z853 Personal history of malignant neoplasm of breast: Secondary | ICD-10-CM | POA: Diagnosis not present

## 2015-11-15 DIAGNOSIS — D72829 Elevated white blood cell count, unspecified: Secondary | ICD-10-CM | POA: Insufficient documentation

## 2015-11-15 DIAGNOSIS — Z87891 Personal history of nicotine dependence: Secondary | ICD-10-CM

## 2015-11-15 DIAGNOSIS — N939 Abnormal uterine and vaginal bleeding, unspecified: Secondary | ICD-10-CM | POA: Insufficient documentation

## 2015-11-15 DIAGNOSIS — N950 Postmenopausal bleeding: Secondary | ICD-10-CM | POA: Diagnosis not present

## 2015-11-15 DIAGNOSIS — Z7984 Long term (current) use of oral hypoglycemic drugs: Secondary | ICD-10-CM | POA: Diagnosis not present

## 2015-11-15 NOTE — Progress Notes (Signed)
[place] NOTE  Patient Care Team: Glean Hess, MD as PCP - General (Family Medicine)  CHIEF COMPLAINTS/PURPOSE OF CONSULTATION:   # April 2017- ANEMIA-MILD Hb 11 [Iron studies- neg; b12 folate/LDH-N; stool occult x2- N]  # MILD CHRONIC LEUCOCYTOSIS 11-12/predom Neutrophilia.   # 2010-RIGHT MASTECTOMY [[place]? Benign as per pt]   # DM-2/PN  HISTORY OF PRESENTING ILLNESS:  Kendra Walton 65 y.o.  female  with a history of mild chronic leukocytosis/ also mild anemia is here for follow-up/to review the results of her labs.  In the interim patient was evaluated by gynecology for intermittent vaginal spotting.   Patient denies any new fevers or chills. Denies any use of steroids.   Patient has chronic pain of her bilateral wrists; was especially with movement. Ongoing for many months.  ROS: A complete 10 point review of system is done which is negative except mentioned above in history of present illness  MEDICAL HISTORY:  Past Medical History  Diagnosis Date  . Breast cancer ([facility]) 2010    rt mastectomy  . Diabetes mellitus without complication ([place])   . Thyroid disease   . Breast cancer ([place])   . Ectopic pregnancy, tubal     SURGICAL HISTORY: Past Surgical History  Procedure Laterality Date  . Mastectomy Right 2010  . Cholecystectomy  2005  . Mastectomy Right 2010  . Reduction mammaplasty Left 2011  . Ectopic pregnancy surgery    . Cesarean section      SOCIAL HISTORY: [place].  Social History   Social History  . Marital Status: Married    Spouse Name: N/A  . Number of Children: N/A  . Years of Education: N/A   Occupational History  . Not on file.   Social History Main Topics  . Smoking status: Former Smoker -- 40 years    Types: Cigarettes    Quit date: 06/18/2012  . Smokeless tobacco: Never Used  . Alcohol Use: No  . Drug Use: No  . Sexual Activity: No   Other Topics Concern  . Not on file   Social History  Narrative    FAMILY HISTORY: Family History  Problem Relation Age of Onset  . Hypertension Brother   . Diabetes Brother     ALLERGIES:  has No Known Allergies.  MEDICATIONS:  Current Outpatient Prescriptions  Medication Sig Dispense Refill  . atorvastatin (LIPITOR) 10 MG tablet Take 1 tablet (10 mg total) by mouth daily. 90 tablet 1  . cholecalciferol (VITAMIN D) 1000 units tablet Take 1,000 Units by mouth daily. Reported on 10/04/2015    . levothyroxine (SYNTHROID, LEVOTHROID) 125 MCG tablet TAKE 2 TABLETS BY MOUTH EVERY DAY 60 tablet 12  . lisinopril-hydrochlorothiazide (PRINZIDE,ZESTORETIC) 10-12.5 MG tablet TAKE 1 TABLET BY MOUTH EVERY DAY 90 tablet 3  . metFORMIN (GLUCOPHAGE-XR) 500 MG 24 hr tablet TAKE 2 TABLETS BY MOUTH TWICE DAILY 360 tablet 5  . MULTIPLE VITAMINS-MINERALS ER PO Take 1 tablet by mouth daily.    . ONE TOUCH ULTRA TEST test strip USE AS DIRECTED TO TEST DAILY 50 each 12  . ONETOUCH DELICA LANCETS 09O MISC 1 each by Other route 2 (two) times daily. 100 each 3  . pregabalin (LYRICA) 50 MG capsule Take 1 capsule (50 mg total) by mouth 3 (three) times daily. 90 capsule 5  . traMADol (ULTRAM) 50 MG tablet Take 2 tablets (100 mg total) by mouth every 8 (eight) hours as needed. 180 tablet 5   No current facility-administered  medications for this visit.      Marland Kitchen  PHYSICAL EXAMINATION:   Filed Vitals:   11/15/15 0913  BP: 107/57  Pulse: 89  Temp: 98.6 F (37 C)  Resp: 8   Filed Weights   11/15/15 0913  Weight: 225 lb 15.5 oz (102.5 kg)    GENERAL: Well-nourished well-developed; Alert, no distress and comfortable.  Accompanied by her husband. EYES: no pallor or icterus OROPHARYNX: no thrush or ulceration; good dentition  NECK: supple, no masses felt LYMPH:  no palpable lymphadenopathy in the cervical, axillary or inguinal regions LUNGS: clear to auscultation and  No wheeze or crackles HEART/CVS: regular rate & rhythm and no murmurs; No lower extremity  edema ABDOMEN: abdomen soft, non-tender and normal bowel sounds Musculoskeletal:no cyanosis of digits and no clubbing  PSYCH: alert & oriented x 3 with fluent speech NEURO: no focal motor/sensory deficits SKIN:  no rashes or significant lesions  LABORATORY DATA:  I have reviewed the data as listed Lab Results  Component Value Date   WBC 13.6* 10/04/2015   HGB 11.1* 10/04/2015   HCT 34.1* 10/04/2015   MCV 83.2 10/04/2015   PLT 272 10/04/2015    Recent Labs  12/06/14 0835 08/08/15 1056  NA 138 141  K 4.7 4.8  CL 94* 97  CO2 26 26  GLUCOSE 153* 129*  BUN 14 11  CREATININE 0.67 0.76  CALCIUM 9.6 10.0  GFRNONAA 92 81  GFRAA 106 94  PROT  --  7.5  ALBUMIN  --  4.2  AST  --  17  ALT  --  21  ALKPHOS  --  77  BILITOT  --  <0.2     ASSESSMENT & PLAN:   #  Anemia- Mild hemoglobin 11.1/normal 12. Iron studies Z61 folic acid normal. Stool occult card 2 negative. The etiology is unclear. Discussed with the patient that if her hemoglobin continues to drop I would recommend evaluation with a bone marrow biopsy.  #  Mild  Chronic leukocytosis/ predominant neutrophilia-  Unclear etiology monitor for now. Recommend checking BCR ABL; peripheral blood flow cytometry.   #  Vaginal bleeding  In a postmenopausal woman-  status post evaluation with GYN. Unlikely cause of her mild anemia. Awaiting pelvic ultrasound.   # Patient follow-up with me in approximately 4 months/ labs 1 week prior.  # 15 minutes face-to-face with the patient discussing the above plan of care; more than 50% of time spent on natural history; counseling and coordination.    Cammie Sickle, MD 11/15/2015 9:20 AM

## 2015-11-15 NOTE — Progress Notes (Signed)
pt sch. for pelvic u/s today under the care of Dr. Marcelline Mates.

## 2015-11-29 ENCOUNTER — Telehealth: Payer: Self-pay

## 2015-11-29 NOTE — Telephone Encounter (Signed)
Called pt informed her that per Dr.Cherry her endometrium is thickened and she needs to have an endometrial biopsy performed. Pt states "no I do not want to do that at this time, I have too many doctors appointments and insurance is nailing me" advised pt that it was strongly recommended that she have this procedure, pt still declines. Pt states that she will call back and schedule the appointment for the biopsy in a few months. Provider informed.

## 2015-12-13 ENCOUNTER — Encounter: Payer: Self-pay | Admitting: Internal Medicine

## 2015-12-13 ENCOUNTER — Ambulatory Visit (INDEPENDENT_AMBULATORY_CARE_PROVIDER_SITE_OTHER): Payer: Medicare Other | Admitting: Internal Medicine

## 2015-12-13 VITALS — BP 112/78 | HR 72 | Resp 16 | Ht 68.0 in | Wt 224.3 lb

## 2015-12-13 DIAGNOSIS — E1169 Type 2 diabetes mellitus with other specified complication: Secondary | ICD-10-CM

## 2015-12-13 DIAGNOSIS — E1142 Type 2 diabetes mellitus with diabetic polyneuropathy: Secondary | ICD-10-CM

## 2015-12-13 DIAGNOSIS — H9193 Unspecified hearing loss, bilateral: Secondary | ICD-10-CM

## 2015-12-13 DIAGNOSIS — E785 Hyperlipidemia, unspecified: Secondary | ICD-10-CM | POA: Diagnosis not present

## 2015-12-13 DIAGNOSIS — H9190 Unspecified hearing loss, unspecified ear: Secondary | ICD-10-CM | POA: Insufficient documentation

## 2015-12-13 DIAGNOSIS — I10 Essential (primary) hypertension: Secondary | ICD-10-CM | POA: Diagnosis not present

## 2015-12-13 DIAGNOSIS — D72829 Elevated white blood cell count, unspecified: Secondary | ICD-10-CM | POA: Diagnosis not present

## 2015-12-13 MED ORDER — ATORVASTATIN CALCIUM 10 MG PO TABS: 10.0000 mg | ORAL_TABLET | Freq: Every day | ORAL | Status: DC

## 2015-12-13 NOTE — Progress Notes (Signed)
Date:  12/13/2015   Name:  Kendra Walton   DOB:  1951-06-12   MRN:  [id number]   Chief Complaint: Hypertension; Diabetes; and Hyperlipidemia Hypertension This is a chronic problem. The current episode started more than 1 year ago. The problem is unchanged. The problem is controlled. Pertinent negatives include no chest pain, headaches, palpitations or shortness of breath.  Diabetes She presents for her follow-up diabetic visit. She has type 2 diabetes mellitus. Her disease course has been stable. Pertinent negatives for hypoglycemia include no dizziness, headaches or nervousness/anxiousness. Pertinent negatives for diabetes include no chest pain and no fatigue. Diabetic complications include peripheral neuropathy (much improved with Lyrica). Current diabetic treatment includes oral agent (monotherapy). She is compliant with treatment most of the time.  Hyperlipidemia This is a chronic problem. The current episode started more than 1 year ago. Recent lipid tests were reviewed and are high. Pertinent negatives include no chest pain or shortness of breath. Current antihyperlipidemic treatment includes statins (atorvastatin started last visit). There are no compliance problems.    Leukocytosis - had evaluation with Hematology. Likely benign but will have repeat testing and follow up in 4 months.  Review of Systems  Constitutional: Negative for fever, chills and fatigue.  HENT: Positive for hearing loss. Negative for ear pain.   Eyes: Negative for visual disturbance.  Respiratory: Negative for cough, chest tightness and shortness of breath.   Cardiovascular: Negative for chest pain, palpitations and leg swelling.  Gastrointestinal: Negative for abdominal pain.  Genitourinary: Negative for frequency, hematuria and difficulty urinating.  Musculoskeletal: Negative for arthralgias.  Neurological: Negative for dizziness, numbness and headaches.  Hematological: Negative for adenopathy.    Psychiatric/Behavioral: Negative for sleep disturbance and dysphoric mood. The patient is not nervous/anxious.     Patient Active Problem List   Diagnosis Date Noted  . Leukocytosis 09/15/2015  . Absolute anemia 09/15/2015  . Hyperlipidemia associated with type 2 diabetes mellitus ([place]) 09/14/2015  . Wrist pain, right 04/07/2015  . History of breast cancer in female 04/07/2015  . Acquired hypothyroidism 10/04/2014  . Diabetes mellitus with polyneuropathy ([place]) 10/04/2014  . Essential (primary) hypertension 10/04/2014  . Arthritis of knee, degenerative 10/04/2014  . Cutaneous eruption 10/04/2014  . Diabetes mellitus type 2, uncontrolled ([place]) 10/04/2014    Prior to Admission medications   Medication Sig Start Date End Date Taking? Authorizing Provider  atorvastatin (LIPITOR) 10 MG tablet Take 1 tablet (10 mg total) by mouth daily. 08/10/15  Yes Glean Hess, MD  cholecalciferol (VITAMIN D) 1000 units tablet Take 1,000 Units by mouth daily. Reported on 10/04/2015   Yes Historical Provider, MD  levothyroxine (SYNTHROID, LEVOTHROID) 125 MCG tablet TAKE 2 TABLETS BY MOUTH EVERY DAY 08/09/15  Yes Glean Hess, MD  lisinopril-hydrochlorothiazide (PRINZIDE,ZESTORETIC) 10-12.5 MG tablet TAKE 1 TABLET BY MOUTH EVERY DAY 11/05/15  Yes Glean Hess, MD  metFORMIN (GLUCOPHAGE-XR) 500 MG 24 hr tablet TAKE 2 TABLETS BY MOUTH TWICE DAILY 03/29/15  Yes Glean Hess, MD  MULTIPLE VITAMINS-MINERALS ER PO Take 1 tablet by mouth daily.   Yes Historical Provider, MD  ONE TOUCH ULTRA TEST test strip USE AS DIRECTED TO TEST DAILY 10/31/15  Yes Glean Hess, MD  [facility] DELICA LANCETS [id number] MISC 1 each by Other route 2 (two) times daily. 04/07/15  Yes Glean Hess, MD  pregabalin (LYRICA) 50 MG capsule Take 1 capsule (50 mg total) by mouth 3 (three) times daily. 10/10/15  Yes Glean Hess, MD  traMADol (ULTRAM) 50 MG tablet Take 2 tablets (100 mg total) by mouth every 8 (eight) hours as  needed. 10/19/15  Yes Glean Hess, MD    No Known Allergies  Past Surgical History  Procedure Laterality Date  . Mastectomy Right 2010  . Cholecystectomy  2005  . Mastectomy Right 2010  . Reduction mammaplasty Left 2011  . Ectopic pregnancy surgery    . Cesarean section      Social History  Substance Use Topics  . Smoking status: Former Smoker -- 40 years    Types: Cigarettes    Quit date: 06/18/2012  . Smokeless tobacco: Never Used  . Alcohol Use: No    Medication list has been reviewed and updated.   Physical Exam  Constitutional: She is oriented to person, place, and time. She appears well-developed. No distress.  HENT:  Head: Normocephalic and atraumatic.  Neck: Normal range of motion. Neck supple. Carotid bruit is not present. No thyromegaly present.  Cardiovascular: Normal rate, regular rhythm and normal heart sounds.   Pulmonary/Chest: Effort normal and breath sounds normal. No respiratory distress.  Musculoskeletal: Normal range of motion. She exhibits no edema or tenderness.  Lymphadenopathy:    She has no cervical adenopathy.  Neurological: She is alert and oriented to person, place, and time.  Skin: Skin is warm and dry. No rash noted.  Psychiatric: She has a normal mood and affect. Her behavior is normal. Thought content normal.  Nursing note and vitals reviewed.   BP 112/78 mmHg  Pulse 72  Resp 16  Ht 5\' 8"  (1.727 m)  Wt 224 lb 4.8 oz (101.742 kg)  BMI 34.11 kg/m2  SpO2 98%  Assessment and Plan: 1. Diabetic polyneuropathy associated with type 2 diabetes mellitus ([place]) Controlled - continue diet, weight loss Doing well on Lyrica - Comprehensive metabolic panel - Hemoglobin A1c  2. Essential (primary) hypertension controlled  3. Hyperlipidemia associated with type 2 diabetes mellitus (HCC) Check LFTs - atorvastatin (LIPITOR) 10 MG tablet; Take 1 tablet (10 mg total) by mouth daily.  Dispense: 90 tablet; Refill: [place],  MD [facility]  12/13/2015

## 2015-12-14 LAB — COMPREHENSIVE METABOLIC PANEL
A/G RATIO: 1.2 (ref 1.2–2.2)
ALBUMIN: 4.1 g/dL (ref 3.6–4.8)
ALT: 19 IU/L (ref 0–32)
AST: 15 IU/L (ref 0–40)
Alkaline Phosphatase: 90 IU/L (ref 39–117)
BILIRUBIN TOTAL: 0.3 mg/dL (ref 0.0–1.2)
BUN / CREAT RATIO: 26 (ref 12–28)
BUN: 22 mg/dL (ref 8–27)
CHLORIDE: 97 mmol/L (ref 96–106)
CO2: 25 mmol/L (ref 18–29)
Calcium: 9.8 mg/dL (ref 8.7–10.3)
Creatinine, Ser: 0.84 mg/dL (ref 0.57–1.00)
GFR calc non Af Amer: 72 mL/min/{1.73_m2} (ref >59)
GFR, EST AFRICAN AMERICAN: 83 mL/min/{1.73_m2} (ref >59)
GLOBULIN, TOTAL: 3.4 g/dL (ref 1.5–4.5)
Glucose: 95 mg/dL (ref 65–99)
POTASSIUM: 4.8 mmol/L (ref 3.5–5.2)
SODIUM: 140 mmol/L (ref 134–144)
TOTAL PROTEIN: 7.5 g/dL (ref 6.0–8.5)

## 2015-12-14 LAB — HEMOGLOBIN A1C
Est. average glucose Bld gHb Est-mCnc: 117 mg/dL
Hgb A1c MFr Bld: 5.7 % — ABNORMAL HIGH (ref 4.8–5.6)

## 2016-01-12 DIAGNOSIS — L918 Other hypertrophic disorders of the skin: Secondary | ICD-10-CM | POA: Diagnosis not present

## 2016-01-12 DIAGNOSIS — L821 Other seborrheic keratosis: Secondary | ICD-10-CM | POA: Diagnosis not present

## 2016-01-12 DIAGNOSIS — L304 Erythema intertrigo: Secondary | ICD-10-CM | POA: Diagnosis not present

## 2016-01-12 DIAGNOSIS — L280 Lichen simplex chronicus: Secondary | ICD-10-CM | POA: Diagnosis not present

## 2016-01-12 DIAGNOSIS — Z1283 Encounter for screening for malignant neoplasm of skin: Secondary | ICD-10-CM | POA: Diagnosis not present

## 2016-01-12 DIAGNOSIS — L820 Inflamed seborrheic keratosis: Secondary | ICD-10-CM | POA: Diagnosis not present

## 2016-01-12 DIAGNOSIS — D1801 Hemangioma of skin and subcutaneous tissue: Secondary | ICD-10-CM | POA: Diagnosis not present

## 2016-01-26 DIAGNOSIS — L918 Other hypertrophic disorders of the skin: Secondary | ICD-10-CM | POA: Diagnosis not present

## 2016-01-26 DIAGNOSIS — B078 Other viral warts: Secondary | ICD-10-CM | POA: Diagnosis not present

## 2016-01-26 DIAGNOSIS — R238 Other skin changes: Secondary | ICD-10-CM | POA: Diagnosis not present

## 2016-01-26 DIAGNOSIS — R208 Other disturbances of skin sensation: Secondary | ICD-10-CM | POA: Diagnosis not present

## 2016-01-26 DIAGNOSIS — L280 Lichen simplex chronicus: Secondary | ICD-10-CM | POA: Diagnosis not present

## 2016-01-26 DIAGNOSIS — L538 Other specified erythematous conditions: Secondary | ICD-10-CM | POA: Diagnosis not present

## 2016-01-26 DIAGNOSIS — L304 Erythema intertrigo: Secondary | ICD-10-CM | POA: Diagnosis not present

## 2016-02-03 ENCOUNTER — Other Ambulatory Visit: Payer: Self-pay | Admitting: Internal Medicine

## 2016-04-09 ENCOUNTER — Other Ambulatory Visit: Payer: Self-pay | Admitting: Internal Medicine

## 2016-04-09 DIAGNOSIS — E1142 Type 2 diabetes mellitus with diabetic polyneuropathy: Secondary | ICD-10-CM

## 2016-04-09 MED ORDER — PREGABALIN 50 MG PO CAPS: 50.0000 mg | ORAL_CAPSULE | Freq: Three times a day (TID) | ORAL | 3 refills | Status: DC

## 2016-04-11 ENCOUNTER — Encounter: Payer: Self-pay | Admitting: Internal Medicine

## 2016-04-11 LAB — HM DIABETES EYE EXAM

## 2016-04-13 ENCOUNTER — Encounter: Payer: Self-pay | Admitting: Internal Medicine

## 2016-04-13 ENCOUNTER — Ambulatory Visit (INDEPENDENT_AMBULATORY_CARE_PROVIDER_SITE_OTHER): Payer: Medicare Other | Admitting: Internal Medicine

## 2016-04-13 VITALS — BP 102/68 | HR 78 | Resp 16 | Ht 68.0 in | Wt 240.0 lb

## 2016-04-13 DIAGNOSIS — IMO0002 Reserved for concepts with insufficient information to code with codable children: Secondary | ICD-10-CM

## 2016-04-13 DIAGNOSIS — E785 Hyperlipidemia, unspecified: Secondary | ICD-10-CM | POA: Diagnosis not present

## 2016-04-13 DIAGNOSIS — E1142 Type 2 diabetes mellitus with diabetic polyneuropathy: Secondary | ICD-10-CM | POA: Diagnosis not present

## 2016-04-13 DIAGNOSIS — E1165 Type 2 diabetes mellitus with hyperglycemia: Secondary | ICD-10-CM

## 2016-04-13 DIAGNOSIS — I10 Essential (primary) hypertension: Secondary | ICD-10-CM | POA: Diagnosis not present

## 2016-04-13 DIAGNOSIS — E1169 Type 2 diabetes mellitus with other specified complication: Secondary | ICD-10-CM

## 2016-04-13 DIAGNOSIS — Z23 Encounter for immunization: Secondary | ICD-10-CM

## 2016-04-13 DIAGNOSIS — N950 Postmenopausal bleeding: Secondary | ICD-10-CM

## 2016-04-13 HISTORY — DX: Postmenopausal bleeding: N95.0

## 2016-04-13 NOTE — Progress Notes (Signed)
Date:  04/13/2016   Name:  Kendra Walton   DOB:  [birth year]   MRN:  [id number]   Chief Complaint: Diabetes (BS 130s ) Diabetes  She presents for her follow-up diabetic visit. She has type 2 diabetes mellitus. Associated symptoms include foot paresthesias. Pertinent negatives for diabetes include no chest pain and no fatigue. Current diabetic treatment includes oral agent (monotherapy). She is compliant with treatment most of the time. Her breakfast blood glucose is taken between 7-8 am. Her breakfast blood glucose range is generally 130-140 mg/dl.  Hypertension  This is a chronic problem. The problem is unchanged. The problem is controlled. Pertinent negatives include no chest pain, palpitations or shortness of breath. Risk factors for coronary artery disease include diabetes mellitus and dyslipidemia.  Hyperlipidemia  This is a chronic problem. Condition status: just started medication. Pertinent negatives include no chest pain, focal weakness, leg pain, myalgias or shortness of breath. Current antihyperlipidemic treatment includes statins.   Post menopausal bleeding - seen by GYN with thickened endometrium on [place].  She declines biopsy on the grounds that she has had mild bleeding for years and does not believe that there is anything wrong.  She would not discuss her reasoning further.  Neuropathy - much improved with Lyrica.  Has applied for pt assistance but needs another 2 week supply to get her through.   Lab Results  Component Value Date   HGBA1C 5.7 (H) 12/13/2015     Review of Systems  Constitutional: Positive for unexpected weight change. Negative for chills, fatigue and fever.  Respiratory: Negative for cough, chest tightness, shortness of breath and wheezing.   Cardiovascular: Negative for chest pain, palpitations and leg swelling.  Gastrointestinal: Negative for abdominal pain, blood in stool and constipation.  Genitourinary: Negative for pelvic pain and vaginal  bleeding.  Musculoskeletal: Positive for arthralgias. Negative for myalgias.  Neurological: Negative for focal weakness.    Patient Active Problem List   Diagnosis Date Noted  . Post-menopausal bleeding 04/13/2016  . Hearing decreased 12/13/2015  . Leukocytosis 09/15/2015  . Absolute anemia 09/15/2015  . Hyperlipidemia associated with type 2 diabetes mellitus ([place]) 09/14/2015  . Wrist pain, right 04/07/2015  . History of breast cancer in female 04/07/2015  . Acquired hypothyroidism 10/04/2014  . Diabetes mellitus with polyneuropathy ([place]) 10/04/2014  . Essential (primary) hypertension 10/04/2014  . Arthritis of knee, degenerative 10/04/2014  . Cutaneous eruption 10/04/2014  . Diabetes mellitus type 2, uncontrolled ([place]) 10/04/2014    Prior to Admission medications   Medication Sig Start Date End Date Taking? Authorizing Provider  atorvastatin (LIPITOR) 10 MG tablet TAKE 1 TABLET(10 MG) BY MOUTH DAILY 02/03/16  Yes Glean Hess, MD  cholecalciferol (VITAMIN D) 1000 units tablet Take 1,000 Units by mouth daily. Reported on 10/04/2015   Yes Historical Provider, MD  levothyroxine (SYNTHROID, LEVOTHROID) 125 MCG tablet TAKE 2 TABLETS BY MOUTH EVERY DAY 08/09/15  Yes Glean Hess, MD  lisinopril-hydrochlorothiazide (PRINZIDE,ZESTORETIC) 10-12.5 MG tablet TAKE 1 TABLET BY MOUTH EVERY DAY 11/05/15  Yes Glean Hess, MD  metFORMIN (GLUCOPHAGE-XR) 500 MG 24 hr tablet TAKE 2 TABLETS BY MOUTH TWICE DAILY 03/29/15  Yes Glean Hess, MD  MULTIPLE VITAMINS-MINERALS ER PO Take 1 tablet by mouth daily.   Yes Historical Provider, MD  ONE TOUCH ULTRA TEST test strip USE AS DIRECTED TO TEST DAILY 10/31/15  Yes Glean Hess, MD  [facility] DELICA LANCETS 99991111 MISC 1 each by Other route 2 (two) times daily. 04/07/15  Yes Glean Hess, MD  pregabalin (LYRICA) 50 MG capsule Take 1 capsule (50 mg total) by mouth 3 (three) times daily. 04/09/16  Yes Glean Hess, MD  traMADol (ULTRAM) 50  MG tablet Take 2 tablets (100 mg total) by mouth every 8 (eight) hours as needed. 10/19/15  Yes Glean Hess, MD    No Known Allergies  Past Surgical History:  Procedure Laterality Date  . CESAREAN SECTION    . CHOLECYSTECTOMY  2005  . ECTOPIC PREGNANCY SURGERY    . mastectomy Right 2010  . MASTECTOMY Right 2010  . REDUCTION MAMMAPLASTY Left 2011    Social History  Substance Use Topics  . Smoking status: Former Smoker    Years: 40.00    Types: Cigarettes    Quit date: 06/18/2012  . Smokeless tobacco: Never Used  . Alcohol use No     Medication list has been reviewed and updated.   Physical Exam  Constitutional: She is oriented to person, place, and time. She appears well-developed. No distress.  HENT:  Head: Normocephalic and atraumatic.  Neck: Normal range of motion. Carotid bruit is not present. No thyromegaly present.  Cardiovascular: Normal rate, regular rhythm and normal heart sounds.   Pulmonary/Chest: Effort normal and breath sounds normal. No respiratory distress.  Musculoskeletal: Normal range of motion. She exhibits no edema or tenderness.  Neurological: She is alert and oriented to person, place, and time.  Skin: Skin is warm and dry. No rash noted.  Psychiatric: She has a normal mood and affect. Her speech is normal and behavior is normal. Thought content normal.  Nursing note and vitals reviewed.   BP 102/68   Pulse 78   Resp 16   Ht 5\' 8"  (1.727 m)   Wt 240 lb (108.9 kg)   SpO2 98%   BMI 36.49 kg/m   Assessment and Plan: 1. Uncontrolled type 2 diabetes mellitus with diabetic polyneuropathy, without long-term current use of insulin ([place]) Doing well on oral agents; needs to work on weight loss - Hemoglobin A1c  2. Essential (primary) hypertension controlled  3. Diabetic polyneuropathy associated with type 2 diabetes mellitus (HCC) Lyrica has been very helpful  4. Post-menopausal bleeding Urged pt to follow up with GYN if bleeding  recurs  5. Hyperlipidemia associated with type 2 diabetes mellitus ([place]) On statin therapy now - will check labs - Lipid panel - Comprehensive metabolic panel  6. Need for influenza vaccination - Flu Vaccine QUAD 36+ mos IM   Halina Maidens, MD [facility]  04/13/2016

## 2016-04-14 LAB — LIPID PANEL
CHOL/HDL RATIO: 2.8 ratio (ref 0.0–4.4)
Cholesterol, Total: 143 mg/dL (ref 100–199)
HDL: 52 mg/dL (ref >39)
LDL CALC: 68 mg/dL (ref 0–99)
TRIGLYCERIDES: 113 mg/dL (ref 0–149)
VLDL Cholesterol Cal: 23 mg/dL (ref 5–40)

## 2016-04-14 LAB — COMPREHENSIVE METABOLIC PANEL
A/G RATIO: 1.3 (ref 1.2–2.2)
ALT: 18 IU/L (ref 0–32)
AST: 12 IU/L (ref 0–40)
Albumin: 4.3 g/dL (ref 3.6–4.8)
Alkaline Phosphatase: 91 IU/L (ref 39–117)
BUN/Creatinine Ratio: 23 (ref 12–28)
BUN: 17 mg/dL (ref 8–27)
Bilirubin Total: 0.3 mg/dL (ref 0.0–1.2)
CALCIUM: 10 mg/dL (ref 8.7–10.3)
CO2: 29 mmol/L (ref 18–29)
Chloride: 98 mmol/L (ref 96–106)
Creatinine, Ser: 0.74 mg/dL (ref 0.57–1.00)
GFR calc Af Amer: 97 mL/min/{1.73_m2} (ref >59)
GFR, EST NON AFRICAN AMERICAN: 84 mL/min/{1.73_m2} (ref >59)
Globulin, Total: 3.2 g/dL (ref 1.5–4.5)
Glucose: 119 mg/dL — ABNORMAL HIGH (ref 65–99)
POTASSIUM: 4.9 mmol/L (ref 3.5–5.2)
Sodium: 141 mmol/L (ref 134–144)
Total Protein: 7.5 g/dL (ref 6.0–8.5)

## 2016-04-14 LAB — HEMOGLOBIN A1C
Est. average glucose Bld gHb Est-mCnc: 120 mg/dL
Hgb A1c MFr Bld: 5.8 % — ABNORMAL HIGH (ref 4.8–5.6)

## 2016-04-17 ENCOUNTER — Telehealth: Payer: Self-pay

## 2016-04-17 ENCOUNTER — Other Ambulatory Visit: Payer: Self-pay | Admitting: Internal Medicine

## 2016-04-17 MED ORDER — ATORVASTATIN CALCIUM 10 MG PO TABS: 10.0000 mg | ORAL_TABLET | Freq: Every day | ORAL | 3 refills | Status: DC

## 2016-04-17 MED ORDER — TRAMADOL HCL 50 MG PO TABS: 100.0000 mg | ORAL_TABLET | Freq: Three times a day (TID) | ORAL | 5 refills | Status: DC | PRN

## 2016-04-17 MED ORDER — METFORMIN HCL ER 500 MG PO TB24: 1000.0000 mg | ORAL_TABLET | Freq: Two times a day (BID) | ORAL | 3 refills | Status: DC

## 2016-04-17 MED ORDER — LEVOTHYROXINE SODIUM 125 MCG PO TABS: 250.0000 ug | ORAL_TABLET | Freq: Every day | ORAL | 3 refills | Status: DC

## 2016-04-17 NOTE — Telephone Encounter (Signed)
Patient wants ALL meds refilled and said he should have had them done at appt and also said she left a message but I did not get any message. Needs all meds but is out of Thyroid and Tramadol now.

## 2016-04-20 ENCOUNTER — Other Ambulatory Visit: Payer: Self-pay | Admitting: Internal Medicine

## 2016-05-29 DIAGNOSIS — H26491 Other secondary cataract, right eye: Secondary | ICD-10-CM | POA: Diagnosis not present

## 2016-06-12 ENCOUNTER — Other Ambulatory Visit: Payer: Self-pay | Admitting: Internal Medicine

## 2016-06-20 DIAGNOSIS — H26491 Other secondary cataract, right eye: Secondary | ICD-10-CM | POA: Diagnosis not present

## 2016-07-20 ENCOUNTER — Other Ambulatory Visit: Payer: Self-pay | Admitting: Internal Medicine

## 2016-07-20 DIAGNOSIS — IMO0002 Reserved for concepts with insufficient information to code with codable children: Secondary | ICD-10-CM

## 2016-07-20 DIAGNOSIS — E1165 Type 2 diabetes mellitus with hyperglycemia: Principal | ICD-10-CM

## 2016-07-20 DIAGNOSIS — E1142 Type 2 diabetes mellitus with diabetic polyneuropathy: Secondary | ICD-10-CM

## 2016-08-03 DIAGNOSIS — H40013 Open angle with borderline findings, low risk, bilateral: Secondary | ICD-10-CM | POA: Diagnosis not present

## 2016-09-11 ENCOUNTER — Encounter: Payer: Medicare Other | Admitting: Internal Medicine

## 2016-09-21 ENCOUNTER — Encounter: Payer: Self-pay | Admitting: Internal Medicine

## 2016-09-21 ENCOUNTER — Ambulatory Visit (INDEPENDENT_AMBULATORY_CARE_PROVIDER_SITE_OTHER): Payer: Medicare Other | Admitting: Internal Medicine

## 2016-09-21 ENCOUNTER — Other Ambulatory Visit: Payer: Self-pay | Admitting: Internal Medicine

## 2016-09-21 VITALS — BP 118/60 | HR 82 | Ht 68.0 in | Wt 235.0 lb

## 2016-09-21 DIAGNOSIS — Z1231 Encounter for screening mammogram for malignant neoplasm of breast: Secondary | ICD-10-CM | POA: Diagnosis not present

## 2016-09-21 DIAGNOSIS — E1165 Type 2 diabetes mellitus with hyperglycemia: Secondary | ICD-10-CM

## 2016-09-21 DIAGNOSIS — E785 Hyperlipidemia, unspecified: Secondary | ICD-10-CM | POA: Diagnosis not present

## 2016-09-21 DIAGNOSIS — E1142 Type 2 diabetes mellitus with diabetic polyneuropathy: Secondary | ICD-10-CM | POA: Diagnosis not present

## 2016-09-21 DIAGNOSIS — E1169 Type 2 diabetes mellitus with other specified complication: Secondary | ICD-10-CM

## 2016-09-21 DIAGNOSIS — Z853 Personal history of malignant neoplasm of breast: Secondary | ICD-10-CM | POA: Diagnosis not present

## 2016-09-21 DIAGNOSIS — I10 Essential (primary) hypertension: Secondary | ICD-10-CM

## 2016-09-21 DIAGNOSIS — Z0000 Encounter for general adult medical examination without abnormal findings: Secondary | ICD-10-CM | POA: Diagnosis not present

## 2016-09-21 DIAGNOSIS — D72829 Elevated white blood cell count, unspecified: Secondary | ICD-10-CM | POA: Diagnosis not present

## 2016-09-21 DIAGNOSIS — IMO0002 Reserved for concepts with insufficient information to code with codable children: Secondary | ICD-10-CM

## 2016-09-21 DIAGNOSIS — E039 Hypothyroidism, unspecified: Secondary | ICD-10-CM | POA: Diagnosis not present

## 2016-09-21 LAB — POCT URINALYSIS DIPSTICK
BILIRUBIN UA: NEGATIVE
Blood, UA: NEGATIVE
Glucose, UA: NEGATIVE
KETONES UA: NEGATIVE
Leukocytes, UA: NEGATIVE
Nitrite, UA: NEGATIVE
PH UA: 5 (ref 5.0–8.0)
PROTEIN UA: NEGATIVE
SPEC GRAV UA: 1.01 (ref 1.030–1.035)
Urobilinogen, UA: 0.2 (ref ?–2.0)

## 2016-09-21 NOTE — Patient Instructions (Signed)
Health Maintenance  Topic Date Due  . TETANUS/TDAP  07/01/1967  . FOOT EXAM  08/07/2016  . PNA vac Low Risk Adult (2 of 2 - PPSV23) 08/07/2016  . MAMMOGRAM  08/22/2016  . DEXA SCAN  08/07/2020 (Originally 06/30/2013)  . COLONOSCOPY  08/07/2020 (Originally 06/30/1998)  . Hepatitis C Screening  08/07/2020 (Originally [birth year])  . HEMOGLOBIN A1C  10/12/2016  . INFLUENZA VACCINE  01/16/2017  . OPHTHALMOLOGY EXAM  04/11/2017

## 2016-09-21 NOTE — Progress Notes (Signed)
Patient: Kendra Walton, Female    DOB: 1951/06/12, 66 y.o.   MRN: [id number] Visit Date: 09/21/2016  Today's Provider: Halina Maidens, MD   Chief Complaint  Patient presents with  . Medicare Wellness    Pt refused breast exam.   Subjective:    Annual wellness visit Kendra Walton is a 66 y.o. female who presents today for her Subsequent Annual Wellness Visit. She feels well. She reports exercising none. She reports she is sleeping well.  She is just recovering from a viral illness with URI sx and vomiting with diarrhea.  Eating and drinking normally now.  No discolored sputum or nasal discharge. ----------------------------------------------------------- Diabetes  She presents for her follow-up diabetic visit. She has type 2 diabetes mellitus. Her disease course has been stable. Pertinent negatives for hypoglycemia include no headaches or tremors. Pertinent negatives for diabetes include no chest pain, no fatigue, no polydipsia, no polyuria and no weight loss. Symptoms are stable. Diabetic complications include peripheral neuropathy. Pertinent negatives for diabetic complications include no nephropathy or retinopathy. Current diabetic treatment includes oral agent (monotherapy). She is compliant with treatment most of the time. She monitors blood glucose at home 1-2 x per week. There is no change in her home blood glucose trend. Her breakfast blood glucose is taken between 8-9 am. Her breakfast blood glucose range is generally 130-140 mg/dl. An ACE inhibitor/angiotensin II receptor blocker is being taken.  Hypertension  This is a chronic problem. The problem is unchanged. The problem is controlled. Pertinent negatives include no chest pain, headaches, palpitations or shortness of breath. There is no history of retinopathy. Identifiable causes of hypertension include a thyroid problem.  Hyperlipidemia  This is a chronic problem. Pertinent negatives include no chest pain or  shortness of breath. Current antihyperlipidemic treatment includes statins. The current treatment provides significant improvement of lipids.  Thyroid Problem  Presents for follow-up visit. Patient reports no fatigue, palpitations, tremors or weight loss. The symptoms have been stable. Her past medical history is significant for hyperlipidemia.    Review of Systems  Constitutional: Negative for appetite change, fatigue, fever, unexpected weight change and weight loss.  HENT: Negative for tinnitus and trouble swallowing.   Eyes: Negative for visual disturbance.  Respiratory: Positive for cough (improving). Negative for chest tightness and shortness of breath.   Cardiovascular: Negative for chest pain, palpitations and leg swelling.  Gastrointestinal: Negative for abdominal pain.  Endocrine: Negative for polydipsia and polyuria.  Genitourinary: Negative for dysuria and hematuria.  Musculoskeletal: Negative for arthralgias.  Neurological: Negative for tremors, numbness and headaches.  Hematological: Negative for adenopathy. Does not bruise/bleed easily.  Psychiatric/Behavioral: Negative for dysphoric mood.    Social History   Social History  . Marital status: Married    Spouse name: N/A  . Number of children: N/A  . Years of education: N/A   Occupational History  . Not on file.   Social History Main Topics  . Smoking status: Former Smoker    Years: 40.00    Types: Cigarettes    Quit date: 06/18/2012  . Smokeless tobacco: Never Used  . Alcohol use No  . Drug use: No  . Sexual activity: No   Other Topics Concern  . Not on file   Social History Narrative  . No narrative on file    Patient Active Problem List   Diagnosis Date Noted  . Post-menopausal bleeding 04/13/2016  . Hearing decreased 12/13/2015  . Leukocytosis 09/15/2015  . Absolute anemia 09/15/2015  . Hyperlipidemia  associated with type 2 diabetes mellitus ([place]) 09/14/2015  . Wrist pain, right 04/07/2015  .  History of breast cancer in female 04/07/2015  . Acquired hypothyroidism 10/04/2014  . Essential (primary) hypertension 10/04/2014  . Arthritis of knee, degenerative 10/04/2014  . Cutaneous eruption 10/04/2014  . Uncontrolled type 2 diabetes mellitus with diabetic polyneuropathy, without long-term current use of insulin ([place]) 10/04/2014    Past Surgical History:  Procedure Laterality Date  . CESAREAN SECTION    . CHOLECYSTECTOMY  2005  . ECTOPIC PREGNANCY SURGERY    . mastectomy Right 2010  . MASTECTOMY Right 2010  . REDUCTION MAMMAPLASTY Left 2011    Her family history includes Diabetes in her brother; Hypertension in her brother.     Previous Medications   ASPIRIN EC 81 MG TABLET    Take 81 mg by mouth daily.   ATORVASTATIN (LIPITOR) 10 MG TABLET    Take 1 tablet (10 mg total) by mouth daily at 6 PM.   LEVOTHYROXINE (SYNTHROID, LEVOTHROID) 125 MCG TABLET    Take 2 tablets (250 mcg total) by mouth daily.   LISINOPRIL-HYDROCHLOROTHIAZIDE (PRINZIDE,ZESTORETIC) 10-12.5 MG TABLET    TAKE 1 TABLET BY MOUTH EVERY DAY   METFORMIN (GLUCOPHAGE-XR) 500 MG 24 HR TABLET    TAKE 2 TABLETS BY MOUTH TWICE DAILY   MULTIPLE VITAMINS-MINERALS ER PO    Take 1 tablet by mouth daily.   ONE TOUCH ULTRA TEST TEST STRIP    USE AS DIRECTED TO TEST DAILY   ONETOUCH DELICA LANCETS 15V MISC    USE TWICE DAILY   PREGABALIN (LYRICA) 50 MG CAPSULE    Take 1 capsule (50 mg total) by mouth 3 (three) times daily.   TRAMADOL (ULTRAM) 50 MG TABLET    Take 2 tablets (100 mg total) by mouth every 8 (eight) hours as needed.    Patient Care Team: Glean Hess, MD as PCP - General (Family Medicine)      Objective:   Vitals: BP 118/60 (BP Location: Right Arm, Patient Position: Sitting, Cuff Size: Normal)   Pulse 82   Ht 5\' 8"  (1.727 m)   Wt 235 lb (106.6 kg)   SpO2 98%   BMI 35.73 kg/m   Physical Exam  Constitutional: She is oriented to person, place, and time. She appears well-developed. No distress.    HENT:  Head: Normocephalic and atraumatic.  Cardiovascular: Normal rate, regular rhythm and normal heart sounds.  Exam reveals decreased pulses.   No murmur heard. Prominent S2  Pulmonary/Chest: Effort normal. No respiratory distress. She has wheezes in the right upper field and the left upper field. She has no rhonchi. She has no rales. Left breast exhibits no mass, no nipple discharge, no skin change and no tenderness.  Right breast reconstructed - surgical changes but no evidence of skin mets  Abdominal: Soft. Normal appearance. There is no tenderness.  Musculoskeletal: Normal range of motion.  Neurological: She is alert and oriented to person, place, and time.  Skin: Skin is warm and dry. No rash noted.  Psychiatric: She has a normal mood and affect. Her speech is normal and behavior is normal. Thought content normal. Cognition and memory are normal.  Nursing note and vitals reviewed.   Activities of Daily Living In your present state of health, do you have any difficulty performing the following activities: 09/21/2016  Hearing? N  Vision? N  Difficulty concentrating or making decisions? N  Walking or climbing stairs? N  Dressing or bathing? N  Doing  errands, shopping? N  Preparing Food and eating ? N  Using the Toilet? N  In the past six months, have you accidently leaked urine? N  Do you have problems with loss of bowel control? N  Managing your Medications? N  Managing your Finances? N  Housekeeping or managing your Housekeeping? N  Some recent data might be hidden    Fall Risk Assessment Fall Risk  09/21/2016 08/08/2015 12/06/2014  Falls in the past year? Yes No No  Number falls in past yr: 1 - -  Injury with Fall? No - -      Depression Screen PHQ 2/9 Scores 09/21/2016 08/08/2015 12/06/2014  PHQ - 2 Score 0 0 0    6CIT Screen 09/21/2016  What Year? 0 points  What month? 0 points  What time? 0 points  Count back from 20 0 points  Months in reverse 0 points  Repeat  phrase 0 points  Total Score 0     Medicare Annual Wellness Visit Summary:  Reviewed patient's Family Medical History Reviewed and updated list of patient's medical providers Assessment of cognitive impairment was done Assessed patient's functional ability Established a written schedule for health screening [place] Completed and Reviewed  Exercise Activities and Dietary recommendations Goals    . <enter goal here> (pt-stated)          To resume daily calcium and vitamin D       Immunization History  Administered Date(s) Administered  . Influenza,inj,Quad PF,36+ Mos 04/07/2015, 04/13/2016  . Influenza-Unspecified 05/19/2014  . Pneumococcal Conjugate-13 08/08/2015    Health Maintenance  Topic Date Due  . TETANUS/TDAP  07/01/1967  . FOOT EXAM  08/07/2016  . PNA vac Low Risk Adult (2 of 2 - PPSV23) 08/07/2016  . MAMMOGRAM  08/22/2016  . DEXA SCAN  08/07/2020 (Originally 06/30/2013)  . COLONOSCOPY  08/07/2020 (Originally 06/30/1998)  . Hepatitis C Screening  08/07/2020 (Originally 1951-02-01)  . HEMOGLOBIN A1C  10/12/2016  . INFLUENZA VACCINE  01/16/2017  . OPHTHALMOLOGY EXAM  04/11/2017    Discussed health benefits of physical activity, and encouraged her to engage in regular exercise appropriate for her age and condition.    ------------------------------------------------------------------------------------------------------------  Assessment & Plan:     1. Medicare annual wellness visit, subsequent Measures satisfied - POCT urinalysis dipstick  2. Essential (primary) hypertension controlled - CBC with Differential/Platelet  3. Acquired hypothyroidism supplemented - TSH  4. Uncontrolled type 2 diabetes mellitus with diabetic polyneuropathy, without long-term current use of insulin (HCC) Continue current therapy - adjust medication if needed - Comprehensive metabolic panel - Hemoglobin A1c - Microalbumin / creatinine urine  ratio  5. Hyperlipidemia associated with type 2 diabetes mellitus ([place]) On statin therapy - Lipid panel  6. History of breast cancer in female Schedule left screening mammogram  7. Need for pneumococcal vaccination Pt declines further vaccinations - she states that she has had several and does not need any more.     Halina Maidens, MD [facility]  09/21/2016

## 2016-09-22 ENCOUNTER — Encounter: Payer: Self-pay | Admitting: Internal Medicine

## 2016-09-22 LAB — CBC WITH DIFFERENTIAL/PLATELET
BASOS: 0 %
Basophils Absolute: 0 10*3/uL (ref 0.0–0.2)
EOS (ABSOLUTE): 0.3 10*3/uL (ref 0.0–0.4)
Eos: 3 %
HEMOGLOBIN: 11.2 g/dL (ref 11.1–15.9)
Hematocrit: 35.9 % (ref 34.0–46.6)
Immature Grans (Abs): 0 10*3/uL (ref 0.0–0.1)
Immature Granulocytes: 0 %
Lymphocytes Absolute: 4.4 10*3/uL — ABNORMAL HIGH (ref 0.7–3.1)
Lymphs: 39 %
MCH: 25.3 pg — AB (ref 26.6–33.0)
MCHC: 31.2 g/dL — ABNORMAL LOW (ref 31.5–35.7)
MCV: 81 fL (ref 79–97)
MONOCYTES: 7 %
Monocytes Absolute: 0.8 10*3/uL (ref 0.1–0.9)
NEUTROS ABS: 5.8 10*3/uL (ref 1.4–7.0)
Neutrophils: 51 %
Platelets: 348 10*3/uL (ref 150–379)
RBC: 4.42 x10E6/uL (ref 3.77–5.28)
RDW: 14.8 % (ref 12.3–15.4)
WBC: 11.4 10*3/uL — ABNORMAL HIGH (ref 3.4–10.8)

## 2016-09-22 LAB — TSH

## 2016-09-22 LAB — COMPREHENSIVE METABOLIC PANEL
ALT: 20 IU/L (ref 0–32)
AST: 17 IU/L (ref 0–40)
Albumin/Globulin Ratio: 1.3 (ref 1.2–2.2)
Albumin: 4.3 g/dL (ref 3.6–4.8)
Alkaline Phosphatase: 97 IU/L (ref 39–117)
BUN/Creatinine Ratio: 34 — ABNORMAL HIGH (ref 12–28)
BUN: 27 mg/dL (ref 8–27)
Bilirubin Total: 0.3 mg/dL (ref 0.0–1.2)
CO2: 24 mmol/L (ref 18–29)
CREATININE: 0.8 mg/dL (ref 0.57–1.00)
Calcium: 9.9 mg/dL (ref 8.7–10.3)
Chloride: 97 mmol/L (ref 96–106)
GFR calc Af Amer: 88 mL/min/{1.73_m2} (ref >59)
GFR calc non Af Amer: 76 mL/min/{1.73_m2} (ref >59)
GLOBULIN, TOTAL: 3.3 g/dL (ref 1.5–4.5)
Glucose: 97 mg/dL (ref 65–99)
Potassium: 5.3 mmol/L — ABNORMAL HIGH (ref 3.5–5.2)
Sodium: 137 mmol/L (ref 134–144)
TOTAL PROTEIN: 7.6 g/dL (ref 6.0–8.5)

## 2016-09-22 LAB — LIPID PANEL
CHOLESTEROL TOTAL: 162 mg/dL (ref 100–199)
Chol/HDL Ratio: 3.9 ratio (ref 0.0–4.4)
HDL: 42 mg/dL (ref >39)
LDL CALC: 75 mg/dL (ref 0–99)
TRIGLYCERIDES: 227 mg/dL — AB (ref 0–149)
VLDL Cholesterol Cal: 45 mg/dL — ABNORMAL HIGH (ref 5–40)

## 2016-09-22 LAB — HEMOGLOBIN A1C
ESTIMATED AVERAGE GLUCOSE: 126 mg/dL
Hgb A1c MFr Bld: 6 % — ABNORMAL HIGH (ref 4.8–5.6)

## 2016-09-25 LAB — MICROALBUMIN / CREATININE URINE RATIO
Creatinine, Urine: 81.6 mg/dL
MICROALB/CREAT RATIO: 5.4 mg/g{creat} (ref 0.0–30.0)
MICROALBUM., U, RANDOM: 4.4 ug/mL

## 2016-10-15 ENCOUNTER — Other Ambulatory Visit: Payer: Self-pay | Admitting: Internal Medicine

## 2016-11-03 ENCOUNTER — Other Ambulatory Visit: Payer: Self-pay | Admitting: Internal Medicine

## 2017-01-21 ENCOUNTER — Ambulatory Visit (INDEPENDENT_AMBULATORY_CARE_PROVIDER_SITE_OTHER): Payer: Medicare Other | Admitting: Internal Medicine

## 2017-01-21 ENCOUNTER — Encounter: Payer: Self-pay | Admitting: Internal Medicine

## 2017-01-21 VITALS — BP 110/72 | HR 80 | Ht 68.0 in | Wt 234.0 lb

## 2017-01-21 DIAGNOSIS — R011 Cardiac murmur, unspecified: Secondary | ICD-10-CM

## 2017-01-21 DIAGNOSIS — E1142 Type 2 diabetes mellitus with diabetic polyneuropathy: Secondary | ICD-10-CM | POA: Diagnosis not present

## 2017-01-21 DIAGNOSIS — I10 Essential (primary) hypertension: Secondary | ICD-10-CM

## 2017-01-21 DIAGNOSIS — E039 Hypothyroidism, unspecified: Secondary | ICD-10-CM

## 2017-01-21 DIAGNOSIS — E1165 Type 2 diabetes mellitus with hyperglycemia: Secondary | ICD-10-CM

## 2017-01-21 DIAGNOSIS — IMO0002 Reserved for concepts with insufficient information to code with codable children: Secondary | ICD-10-CM

## 2017-01-21 MED ORDER — GLUCOSE BLOOD VI STRP: ORAL_STRIP | 12 refills | Status: DC

## 2017-01-21 NOTE — Progress Notes (Signed)
Date:  01/21/2017   Name:  Kendra Walton   DOB:  [birth year]   MRN:  [id number]   Chief Complaint: Diabetes (Needs refill on test strips sent in- [facility] . This morning sugar was 146. ); Hypothyroidism; and Hypertension Diabetes  She presents for her follow-up diabetic visit. She has type 2 diabetes mellitus. Pertinent negatives for hypoglycemia include no headaches or tremors. Pertinent negatives for diabetes include no chest pain, no fatigue, no polydipsia, no polyuria and no weight loss. Diabetic complications include peripheral neuropathy. She monitors blood glucose at home 3-4 x per week.  Hypertension  Pertinent negatives include no chest pain, headaches, palpitations or shortness of breath. Identifiable causes of hypertension include a thyroid problem.  Thyroid Problem  Presents for follow-up visit. Patient reports no constipation, depressed mood, fatigue, leg swelling, palpitations, tremors or weight loss. The symptoms have been stable.    Lab Results  Component Value Date   HGBA1C 6.0 (H) 09/21/2016   Lab Results  Component Value Date   CREATININE 0.80 09/21/2016   Lab Results  Component Value Date   CHOL 162 09/21/2016   HDL 42 09/21/2016   LDLCALC 75 09/21/2016   TRIG 227 (H) 09/21/2016   CHOLHDL 3.9 09/21/2016   Lab Results  Component Value Date   TSH <0.006 (L) 09/21/2016     Review of Systems  Constitutional: Negative for appetite change, fatigue, fever, unexpected weight change and weight loss.  HENT: Negative for tinnitus and trouble swallowing.   Eyes: Negative for visual disturbance.  Respiratory: Negative for cough, chest tightness and shortness of breath.   Cardiovascular: Negative for chest pain, palpitations and leg swelling.  Gastrointestinal: Negative for abdominal pain and constipation.  Endocrine: Negative for polydipsia and polyuria.  Genitourinary: Negative for dysuria and hematuria.  Musculoskeletal: Negative for arthralgias.    Neurological: Negative for tremors, numbness and headaches.  Psychiatric/Behavioral: Negative for dysphoric mood.    Patient Active Problem List   Diagnosis Date Noted  . Post-menopausal bleeding 04/13/2016  . Hearing decreased 12/13/2015  . Leukocytosis 09/15/2015  . Absolute anemia 09/15/2015  . Hyperlipidemia associated with type 2 diabetes mellitus ([place]) 09/14/2015  . Wrist pain, right 04/07/2015  . History of breast cancer in female 04/07/2015  . Acquired hypothyroidism 10/04/2014  . Essential (primary) hypertension 10/04/2014  . Arthritis of knee, degenerative 10/04/2014  . Uncontrolled type 2 diabetes mellitus with diabetic polyneuropathy, without long-term current use of insulin ([place]) 10/04/2014    Prior to Admission medications   Medication Sig Start Date End Date Taking? Authorizing Provider  aspirin EC 81 MG tablet Take 81 mg by mouth daily.   Yes [provider]  atorvastatin (LIPITOR) 10 MG tablet Take 1 tablet (10 mg total) by mouth daily at 6 PM. 04/17/16  Yes Glean Hess, MD  levothyroxine (SYNTHROID, LEVOTHROID) 125 MCG tablet Take 2 tablets (250 mcg total) by mouth daily. 04/17/16  Yes Glean Hess, MD  lisinopril-hydrochlorothiazide (PRINZIDE,ZESTORETIC) 10-12.5 MG tablet TAKE 1 TABLET BY MOUTH EVERY DAY 11/05/15  Yes Glean Hess, MD  metFORMIN (GLUCOPHAGE-XR) 500 MG 24 hr tablet TAKE 2 TABLETS BY MOUTH TWICE DAILY 06/12/16  Yes Glean Hess, MD  MULTIPLE VITAMINS-MINERALS ER PO Take 1 tablet by mouth daily.   Yes [provider]  ONE TOUCH ULTRA TEST test strip USE AS DIRECTED TO TEST DAILY 10/31/15  Yes Glean Hess, MD  [facility] DELICA LANCETS 99M MISC USE TWICE DAILY 07/20/16  Yes Halina Maidens  H, MD  pregabalin (LYRICA) 50 MG capsule Take 1 capsule (50 mg total) by mouth 3 (three) times daily. 04/09/16  Yes Glean Hess, MD  traMADol (ULTRAM) 50 MG tablet TAKE 2 TABLETS BY MOUTH EVERY 8 HOURS AS NEEDED 10/16/16  Yes  Glean Hess, MD    No Known Allergies  Past Surgical History:  Procedure Laterality Date  . CESAREAN SECTION    . CHOLECYSTECTOMY  2005  . ECTOPIC PREGNANCY SURGERY    . MASTECTOMY Right 2010  . REDUCTION MAMMAPLASTY Left 2011    Social History  Substance Use Topics  . Smoking status: Former Smoker    Years: 40.00    Types: Cigarettes    Quit date: 06/18/2012  . Smokeless tobacco: Never Used  . Alcohol use No    Medication list has been reviewed and updated.   Physical Exam  Constitutional: She is oriented to person, place, and time. She appears well-developed. No distress.  HENT:  Head: Normocephalic and atraumatic.  Neck: Normal range of motion. Neck supple. Carotid bruit is not present.  Cardiovascular: Normal rate and regular rhythm.  Exam reveals decreased pulses.   Murmur heard.  Systolic murmur is present with a grade of 3/6  Pulmonary/Chest: Effort normal. No respiratory distress. She has no wheezes.  Musculoskeletal: Normal range of motion.  Neurological: She is alert and oriented to person, place, and time. A sensory deficit is present.  Skin: Skin is warm and dry. No rash noted.  Psychiatric: She has a normal mood and affect. Her speech is normal and behavior is normal. Thought content normal.  Nursing note and vitals reviewed.   BP 110/72   Pulse 80   Ht 5\' 8"  (1.727 m)   Wt 234 lb (106.1 kg)   SpO2 95%   BMI 35.58 kg/m   Assessment and Plan: 1. Uncontrolled type 2 diabetes mellitus with diabetic polyneuropathy, without long-term current use of insulin (HCC) Continue current therapy Request 4 month follow up but patient insists on 6 months - Hemoglobin Y1V - Basic metabolic panel - Ambulatory referral to Cardiology  2. Essential (primary) hypertension controlled  3. Acquired hypothyroidism supplemented - TSH  4. Heart murmur More pronounced Will refer to Cardiology for testing - Ambulatory referral to Cardiology   Meds ordered  this encounter  Medications  . glucose blood (ONE TOUCH ULTRA TEST) test strip    Sig: USE AS DIRECTED TO TEST DAILY    Dispense:  50 each    Refill:  [place], MD [facility]  01/21/2017

## 2017-01-22 LAB — BASIC METABOLIC PANEL
BUN / CREAT RATIO: 28 (ref 12–28)
BUN: 21 mg/dL (ref 8–27)
CO2: 26 mmol/L (ref 20–29)
CREATININE: 0.75 mg/dL (ref 0.57–1.00)
Calcium: 9.7 mg/dL (ref 8.7–10.3)
Chloride: 98 mmol/L (ref 96–106)
GFR calc Af Amer: 95 mL/min/{1.73_m2} (ref >59)
GFR, EST NON AFRICAN AMERICAN: 82 mL/min/{1.73_m2} (ref >59)
GLUCOSE: 82 mg/dL (ref 65–99)
Potassium: 4.9 mmol/L (ref 3.5–5.2)
Sodium: 140 mmol/L (ref 134–144)

## 2017-01-22 LAB — TSH: TSH: <0.006 u[IU]/mL — ABNORMAL LOW (ref 0.450–4.500)

## 2017-01-22 LAB — HEMOGLOBIN A1C
Est. average glucose Bld gHb Est-mCnc: 114 mg/dL
Hgb A1c MFr Bld: 5.6 % (ref 4.8–5.6)

## 2017-01-29 ENCOUNTER — Telehealth: Payer: Self-pay

## 2017-01-29 NOTE — Telephone Encounter (Signed)
Gets medication from supplier which is [facility]. Needs Rx faxed - they have a new fax number - [phone]. Needs her Patient ID wrote on this which is [id number]. They have moved to new facility and have nothing old. Doing everything from scratch. They told her that her RX is too old to fill. Informed pt we will fax this over with ID written on it. Please Advise.

## 2017-01-29 NOTE — Telephone Encounter (Signed)
Patient called after hours requesting a sample of Lyrica 50 mg until her medication come through Mail Order. Called patient and informed for her to pick up 1 box ( 21 capsules ) of Lyrica 50 MG. She verbalized understanding. Left box at front desk for pick up.

## 2017-02-04 ENCOUNTER — Other Ambulatory Visit: Payer: Self-pay | Admitting: Internal Medicine

## 2017-02-05 ENCOUNTER — Telehealth: Payer: Self-pay

## 2017-02-05 NOTE — Telephone Encounter (Signed)
Patient called stating she may need 2 more weeks worth of Lyrica. Her pharmacy [facility] has not even processed her order to even ship her medication. When she called today they told her they are still working on orders from July. They told her today tht her order could be shipped anywhere from 14 week to 1 month from now. Spoke with Dr Army Melia and she approved for pt to come pick up 1 month supply- 4 boxes of 50 mg Lyrica. Pt advised she can pick up before 5 today.

## 2017-02-08 DIAGNOSIS — I10 Essential (primary) hypertension: Secondary | ICD-10-CM | POA: Diagnosis not present

## 2017-02-08 DIAGNOSIS — I358 Other nonrheumatic aortic valve disorders: Secondary | ICD-10-CM | POA: Diagnosis not present

## 2017-02-22 ENCOUNTER — Telehealth: Payer: Self-pay

## 2017-02-22 ENCOUNTER — Other Ambulatory Visit: Payer: Self-pay | Admitting: Internal Medicine

## 2017-02-22 DIAGNOSIS — E1142 Type 2 diabetes mellitus with diabetic polyneuropathy: Secondary | ICD-10-CM

## 2017-02-22 MED ORDER — PREGABALIN 50 MG PO CAPS: 50.0000 mg | ORAL_CAPSULE | Freq: Three times a day (TID) | ORAL | 1 refills | Status: DC

## 2017-02-22 NOTE — Telephone Encounter (Signed)
Patient called- stated [facility] will not send out her Rx we sent for Lyrica because her DOB, and Home Address was not on the Rx. They need a fresh Rx sent in that includes: DOB, Her Address, [place], her ID#: [id number]. Only has 1 week left of samples given. Informed pt I have 2 boxes waiting for her up front desk to pick up- stated she will pick up Monday. Dr Army Melia will send new RX to fax # [phone].

## 2017-03-26 DIAGNOSIS — I358 Other nonrheumatic aortic valve disorders: Secondary | ICD-10-CM | POA: Diagnosis not present

## 2017-04-04 DIAGNOSIS — I10 Essential (primary) hypertension: Secondary | ICD-10-CM | POA: Diagnosis not present

## 2017-04-05 ENCOUNTER — Encounter: Payer: Self-pay | Admitting: Internal Medicine

## 2017-04-05 DIAGNOSIS — I350 Nonrheumatic aortic (valve) stenosis: Secondary | ICD-10-CM | POA: Insufficient documentation

## 2017-04-05 HISTORY — DX: Nonrheumatic aortic (valve) stenosis: I35.0

## 2017-04-15 ENCOUNTER — Other Ambulatory Visit: Payer: Self-pay | Admitting: Internal Medicine

## 2017-04-16 ENCOUNTER — Other Ambulatory Visit: Payer: Self-pay | Admitting: Internal Medicine

## 2017-04-28 ENCOUNTER — Other Ambulatory Visit: Payer: Self-pay | Admitting: Internal Medicine

## 2017-06-28 ENCOUNTER — Other Ambulatory Visit: Payer: Self-pay | Admitting: Internal Medicine

## 2017-07-17 ENCOUNTER — Other Ambulatory Visit: Payer: Self-pay | Admitting: Internal Medicine

## 2017-07-24 ENCOUNTER — Ambulatory Visit (INDEPENDENT_AMBULATORY_CARE_PROVIDER_SITE_OTHER): Payer: Medicare Other | Admitting: Internal Medicine

## 2017-07-24 ENCOUNTER — Encounter: Payer: Self-pay | Admitting: Internal Medicine

## 2017-07-24 VITALS — BP 100/58 | HR 78 | Ht 68.0 in | Wt 240.0 lb

## 2017-07-24 DIAGNOSIS — E039 Hypothyroidism, unspecified: Secondary | ICD-10-CM | POA: Diagnosis not present

## 2017-07-24 DIAGNOSIS — I10 Essential (primary) hypertension: Secondary | ICD-10-CM | POA: Diagnosis not present

## 2017-07-24 DIAGNOSIS — E1165 Type 2 diabetes mellitus with hyperglycemia: Secondary | ICD-10-CM

## 2017-07-24 DIAGNOSIS — IMO0002 Reserved for concepts with insufficient information to code with codable children: Secondary | ICD-10-CM

## 2017-07-24 DIAGNOSIS — Z23 Encounter for immunization: Secondary | ICD-10-CM | POA: Diagnosis not present

## 2017-07-24 DIAGNOSIS — E1142 Type 2 diabetes mellitus with diabetic polyneuropathy: Secondary | ICD-10-CM | POA: Diagnosis not present

## 2017-07-24 NOTE — Progress Notes (Signed)
Date:  07/24/2017   Name:  Kendra Walton   DOB:  July 11, 1950   MRN:  [id number]   Chief Complaint: Diabetes and Immunizations (Flu Shot) Diabetes  She presents for her follow-up diabetic visit. She has type 2 diabetes mellitus. Her disease course has been stable. Pertinent negatives for hypoglycemia include no headaches or tremors. Pertinent negatives for diabetes include no chest pain, no fatigue, no polydipsia and no polyuria. Current diabetic treatment includes oral agent (monotherapy). She is compliant with treatment all of the time. An ACE inhibitor/angiotensin II receptor blocker is being taken.  Thyroid Problem  Presents for follow-up visit. Patient reports no fatigue, palpitations or tremors. Symptom course: last visit dose was decreased due to very low TSH.  Hypertension  This is a chronic problem. The problem is controlled. Pertinent negatives include no chest pain, headaches, palpitations or shortness of breath. Past treatments include ACE inhibitors. Identifiable causes of hypertension include a thyroid problem.   She feels that the lyrica is doing very well.  She did notice that when she missed one dose, she felt very bad, sore and uncomfortable.  She will need her pt assistance paperwork redone soon.   Review of Systems  Constitutional: Negative for appetite change, fatigue, fever and unexpected weight change.  HENT: Negative for tinnitus and trouble swallowing.   Eyes: Negative for visual disturbance.  Respiratory: Negative for cough, chest tightness and shortness of breath.   Cardiovascular: Negative for chest pain, palpitations and leg swelling.  Gastrointestinal: Negative for abdominal pain.  Endocrine: Negative for polydipsia and polyuria.  Genitourinary: Negative for dysuria and hematuria.  Musculoskeletal: Positive for arthralgias and myalgias.  Neurological: Positive for numbness. Negative for tremors and headaches.  Psychiatric/Behavioral: Negative for  dysphoric mood.    Patient Active Problem List   Diagnosis Date Noted  . Aortic stenosis, moderate 04/05/2017  . Post-menopausal bleeding 04/13/2016  . Hearing decreased 12/13/2015  . Leukocytosis 09/15/2015  . Absolute anemia 09/15/2015  . Hyperlipidemia associated with type 2 diabetes mellitus ([place]) 09/14/2015  . Wrist pain, right 04/07/2015  . History of breast cancer in female 04/07/2015  . Acquired hypothyroidism 10/04/2014  . Essential (primary) hypertension 10/04/2014  . Arthritis of knee, degenerative 10/04/2014  . Uncontrolled type 2 diabetes mellitus with diabetic polyneuropathy, without long-term current use of insulin ([place]) 10/04/2014    Prior to Admission medications   Medication Sig Start Date End Date Taking? Authorizing Provider  aspirin EC 81 MG tablet Take 81 mg by mouth daily.    [provider]  atorvastatin (LIPITOR) 10 MG tablet TAKE 1 TABLET(10 MG) BY MOUTH DAILY AT 6 PM 04/28/17   Glean Hess, MD  glucose blood (ONE TOUCH ULTRA TEST) test strip USE AS DIRECTED TO TEST DAILY 01/21/17   Glean Hess, MD  levothyroxine (SYNTHROID, LEVOTHROID) 125 MCG tablet TAKE 2 TABLETS(250 MCG) BY MOUTH DAILY 07/17/17   Glean Hess, MD  lisinopril-hydrochlorothiazide (PRINZIDE,ZESTORETIC) 10-12.5 MG tablet TAKE 1 TABLET BY MOUTH DAILY 02/04/17   Glean Hess, MD  metFORMIN (GLUCOPHAGE-XR) 500 MG 24 hr tablet TAKE 2 TABLETS BY MOUTH TWICE DAILY 06/12/16   Glean Hess, MD  metFORMIN (GLUCOPHAGE-XR) 500 MG 24 hr tablet TAKE 2 TABLETS BY MOUTH TWICE DAILY 06/28/17   Glean Hess, MD  MULTIPLE VITAMINS-MINERALS ER PO Take 1 tablet by mouth daily.    [provider]  Jonetta Speak LANCETS 69S MISC USE TWICE DAILY 07/20/16   Glean Hess, MD  pregabalin (  LYRICA) 50 MG capsule Take 1 capsule (50 mg total) by mouth 3 (three) times daily. 02/22/17   Glean Hess, MD  traMADol Veatrice Bourbon) 50 MG tablet TAKE 2 TABLETS BY MOUTH EVERY 8 HOURS  AS NEEDED 04/15/17   Glean Hess, MD    No Known Allergies  Past Surgical History:  Procedure Laterality Date  . CESAREAN SECTION    . CHOLECYSTECTOMY  2005  . ECTOPIC PREGNANCY SURGERY    . MASTECTOMY Right 2010  . REDUCTION MAMMAPLASTY Left 2011    Social History   Tobacco Use  . Smoking status: Former Smoker    Years: 40.00    Types: Cigarettes    Last attempt to quit: 06/18/2012    Years since quitting: 5.1  . Smokeless tobacco: Never Used  Substance Use Topics  . Alcohol use: No    Alcohol/week: 0.0 oz  . Drug use: No     Medication list has been reviewed and updated.  PHQ 2/9 Scores 09/21/2016 08/08/2015 12/06/2014  PHQ - 2 Score 0 0 0    Physical Exam  Constitutional: She is oriented to person, place, and time. She appears well-developed. No distress.  HENT:  Head: Normocephalic and atraumatic.  Neck: Normal range of motion. Neck supple. No tracheal deviation present. No thyromegaly present.  Cardiovascular: Normal rate, regular rhythm and normal heart sounds.  Pulmonary/Chest: Effort normal and breath sounds normal. No respiratory distress. She has no wheezes.  Musculoskeletal: Normal range of motion. She exhibits no edema or tenderness.  Neurological: She is alert and oriented to person, place, and time. She has normal strength. A sensory deficit is present. Gait normal.  Skin: Skin is warm and dry. No rash noted.  Psychiatric: She has a normal mood and affect. Her behavior is normal. Thought content normal.  Nursing note and vitals reviewed.   BP (!) 100/58   Pulse 78   Ht 5\' 8"  (1.727 m)   Wt 240 lb (108.9 kg)   SpO2 97%   BMI 36.49 kg/m   Assessment and Plan: 1. Uncontrolled type 2 diabetes mellitus with diabetic polyneuropathy, without long-term current use of insulin (HCC) Continue current regimen Continue Lyrica - Hemoglobin A1c  2. Essential (primary) hypertension controlled  3. Acquired hypothyroidism Dose changed - recheck lab -  TSH  4. Need for influenza vaccination - Flu Vaccine QUAD 36+ mos IM   No orders of the defined types were placed in this encounter.   Partially dictated using Editor, commissioning. Any errors are unintentional.  Halina Maidens, MD [facility]  07/24/2017

## 2017-07-25 LAB — HEMOGLOBIN A1C
ESTIMATED AVERAGE GLUCOSE: 131 mg/dL
HEMOGLOBIN A1C: 6.2 % — AB (ref 4.8–5.6)

## 2017-07-25 LAB — TSH: TSH: 0.007 u[IU]/mL — ABNORMAL LOW (ref 0.450–4.500)

## 2017-08-01 ENCOUNTER — Telehealth: Payer: Self-pay

## 2017-08-01 NOTE — Telephone Encounter (Signed)
Patients husband dropped off forms for patient's lyrica. Dr Army Melia filled forms out and wrote prescription. I faxed forms today and received confirmation fax. Called and informed the patient. She did not wish to have her own copy but I Informed her we will scan it in her chart for records.

## 2017-08-15 ENCOUNTER — Telehealth: Payer: Self-pay

## 2017-08-15 NOTE — Telephone Encounter (Signed)
Received fax from [facility] stating they are shipping out patients medication for Lyrica. Patient ID# [id number] and for her to reorder Rx through them she would just need to contact them at [phone].

## 2017-09-06 DIAGNOSIS — E119 Type 2 diabetes mellitus without complications: Secondary | ICD-10-CM | POA: Diagnosis not present

## 2017-09-06 LAB — HM DIABETES EYE EXAM

## 2017-09-13 ENCOUNTER — Encounter: Payer: Self-pay | Admitting: Internal Medicine

## 2017-09-23 ENCOUNTER — Ambulatory Visit: Payer: PRIVATE HEALTH INSURANCE

## 2017-10-01 ENCOUNTER — Other Ambulatory Visit: Payer: Self-pay | Admitting: Internal Medicine

## 2017-10-01 DIAGNOSIS — Z1231 Encounter for screening mammogram for malignant neoplasm of breast: Secondary | ICD-10-CM

## 2017-10-09 ENCOUNTER — Ambulatory Visit
Admission: RE | Admit: 2017-10-09 | Discharge: 2017-10-09 | Disposition: A | Discharge status: Home / Self Care | Payer: Medicare Other | Source: Ambulatory Visit | Attending: Internal Medicine | Admitting: Internal Medicine

## 2017-10-09 DIAGNOSIS — Z1231 Encounter for screening mammogram for malignant neoplasm of breast: Secondary | ICD-10-CM | POA: Insufficient documentation

## 2017-10-13 ENCOUNTER — Other Ambulatory Visit: Payer: Self-pay | Admitting: Internal Medicine

## 2017-12-25 ENCOUNTER — Encounter: Payer: Self-pay | Admitting: Internal Medicine

## 2017-12-25 ENCOUNTER — Ambulatory Visit (INDEPENDENT_AMBULATORY_CARE_PROVIDER_SITE_OTHER): Payer: Medicare Other | Admitting: Internal Medicine

## 2017-12-25 ENCOUNTER — Other Ambulatory Visit: Payer: Self-pay

## 2017-12-25 VITALS — BP 126/82 | HR 71 | Temp 98.1°F | Ht 68.0 in | Wt 249.0 lb

## 2017-12-25 DIAGNOSIS — E039 Hypothyroidism, unspecified: Secondary | ICD-10-CM | POA: Diagnosis not present

## 2017-12-25 DIAGNOSIS — Z23 Encounter for immunization: Secondary | ICD-10-CM | POA: Diagnosis not present

## 2017-12-25 DIAGNOSIS — I10 Essential (primary) hypertension: Secondary | ICD-10-CM

## 2017-12-25 DIAGNOSIS — E785 Hyperlipidemia, unspecified: Secondary | ICD-10-CM

## 2017-12-25 DIAGNOSIS — E1142 Type 2 diabetes mellitus with diabetic polyneuropathy: Secondary | ICD-10-CM

## 2017-12-25 DIAGNOSIS — E1165 Type 2 diabetes mellitus with hyperglycemia: Secondary | ICD-10-CM

## 2017-12-25 DIAGNOSIS — E1169 Type 2 diabetes mellitus with other specified complication: Secondary | ICD-10-CM | POA: Diagnosis not present

## 2017-12-25 DIAGNOSIS — Z1389 Encounter for screening for other disorder: Secondary | ICD-10-CM

## 2017-12-25 DIAGNOSIS — Z0001 Encounter for general adult medical examination with abnormal findings: Secondary | ICD-10-CM | POA: Diagnosis not present

## 2017-12-25 DIAGNOSIS — Z0000 Encounter for general adult medical examination without abnormal findings: Secondary | ICD-10-CM

## 2017-12-25 DIAGNOSIS — IMO0002 Reserved for concepts with insufficient information to code with codable children: Secondary | ICD-10-CM

## 2017-12-25 LAB — POCT URINALYSIS DIPSTICK
Bilirubin, UA: NEGATIVE
Blood, UA: NEGATIVE
GLUCOSE UA: NEGATIVE
KETONES UA: NEGATIVE
Leukocytes, UA: NEGATIVE
Nitrite, UA: NEGATIVE
Protein, UA: NEGATIVE
SPEC GRAV UA: 1.015 (ref 1.010–1.025)
Urobilinogen, UA: 0.2 E.U./dL
pH, UA: 5 (ref 5.0–8.0)

## 2017-12-25 MED ORDER — GLUCOSE BLOOD VI STRP: ORAL_STRIP | 12 refills | Status: DC

## 2017-12-25 MED ORDER — ONETOUCH DELICA LANCETS 33G MISC: 1.0000 | Freq: Two times a day (BID) | 3 refills | Status: DC

## 2017-12-25 NOTE — Patient Instructions (Signed)

## 2017-12-25 NOTE — Progress Notes (Signed)
Date:  12/25/2017   Name:  Kendra Walton   DOB:  05-10-1951   MRN:  [id number]   Chief Complaint: Annual Exam Kendra Walton is a 67 y.o. female who presents today for her Complete Annual Exam. She feels fairly well. She reports exercising none due to foot pain. She reports she is sleeping fairly well. Mammogram was done 09/2017. She declines colon cancer screening.  Diabetes  She presents for her follow-up diabetic visit. She has type 2 diabetes mellitus. Her disease course has been stable. Pertinent negatives for hypoglycemia include no dizziness, headaches, nervousness/anxiousness or tremors. Pertinent negatives for diabetes include no chest pain, no fatigue, no polydipsia, no polyuria and no weight loss. Her weight is stable. She monitors blood glucose at home 3-4 x per week. Her breakfast blood glucose is taken between 6-7 am. Her breakfast blood glucose range is generally 110-130 mg/dl. An ACE inhibitor/angiotensin II receptor blocker is being taken. Eye exam is current.  Hypertension  This is a chronic problem. The problem is controlled. Pertinent negatives include no chest pain, headaches, palpitations or shortness of breath. Past treatments include ACE inhibitors and diuretics. Identifiable causes of hypertension include a thyroid problem.  Thyroid Problem  Presents for follow-up visit. Symptoms include dry skin. Patient reports no anxiety, constipation, diarrhea, fatigue, palpitations, tremors, weight gain or weight loss. The symptoms have been stable. Her past medical history is significant for hyperlipidemia.  Hyperlipidemia  This is a chronic problem. The problem is controlled. Pertinent negatives include no chest pain or shortness of breath. Current antihyperlipidemic treatment includes statins. The current treatment provides significant improvement of lipids.     Review of Systems  Constitutional: Negative for chills, fatigue, fever, weight gain and weight  loss.  HENT: Negative for congestion, hearing loss, tinnitus, trouble swallowing and voice change.   Eyes: Negative for visual disturbance.  Respiratory: Negative for cough, chest tightness, shortness of breath and wheezing.   Cardiovascular: Negative for chest pain, palpitations and leg swelling.  Gastrointestinal: Negative for abdominal pain, constipation, diarrhea and vomiting.  Endocrine: Negative for polydipsia and polyuria.  Genitourinary: Negative for dysuria, frequency, genital sores, vaginal bleeding and vaginal discharge.  Musculoskeletal: Negative for arthralgias, gait problem and joint swelling.  Skin: Negative for color change and rash.  Neurological: Negative for dizziness, tremors, light-headedness, numbness and headaches.  Hematological: Negative for adenopathy. Does not bruise/bleed easily.  Psychiatric/Behavioral: Negative for dysphoric mood and sleep disturbance. The patient is not nervous/anxious.     Patient Active Problem List   Diagnosis Date Noted  . Aortic stenosis, moderate 04/05/2017  . Post-menopausal bleeding 04/13/2016  . Hearing decreased 12/13/2015  . Leukocytosis 09/15/2015  . Absolute anemia 09/15/2015  . Hyperlipidemia associated with type 2 diabetes mellitus ([place]) 09/14/2015  . History of breast cancer in female 04/07/2015  . Acquired hypothyroidism 10/04/2014  . Essential (primary) hypertension 10/04/2014  . Arthritis of knee, degenerative 10/04/2014  . Uncontrolled type 2 diabetes mellitus with diabetic polyneuropathy, without long-term current use of insulin ([place]) 10/04/2014    Prior to Admission medications   Medication Sig Start Date End Date Taking? Authorizing Provider  aspirin EC 81 MG tablet Take 81 mg by mouth daily.   Yes [provider]  atorvastatin (LIPITOR) 10 MG tablet TAKE 1 TABLET(10 MG) BY MOUTH DAILY AT 6 PM 04/28/17  Yes Glean Hess, MD  glucose blood (ONE TOUCH ULTRA TEST) test strip USE AS DIRECTED TO TEST  DAILY 01/21/17  Yes Glean Hess,  MD  levothyroxine (SYNTHROID, LEVOTHROID) 125 MCG tablet TAKE 2 TABLETS(250 MCG) BY MOUTH DAILY 07/17/17  Yes Glean Hess, MD  lisinopril-hydrochlorothiazide (PRINZIDE,ZESTORETIC) 10-12.5 MG tablet TAKE 1 TABLET BY MOUTH DAILY 02/04/17  Yes Glean Hess, MD  metFORMIN (GLUCOPHAGE-XR) 500 MG 24 hr tablet TAKE 2 TABLETS BY MOUTH TWICE DAILY 06/28/17  Yes Glean Hess, MD  MULTIPLE VITAMINS-MINERALS ER PO Take 1 tablet by mouth daily.   Yes [provider]  Jonetta Speak LANCETS 63K MISC USE TWICE DAILY 07/20/16  Yes Glean Hess, MD  pregabalin (LYRICA) 50 MG capsule Take 1 capsule (50 mg total) by mouth 3 (three) times daily. 02/22/17  Yes Glean Hess, MD  traMADol (ULTRAM) 50 MG tablet TAKE 2 TABLETS BY MOUTH EVERY 8 HOURS AS NEEDED 10/14/17  Yes Glean Hess, MD    No Known Allergies  Past Surgical History:  Procedure Laterality Date  . CESAREAN SECTION    . CHOLECYSTECTOMY  2005  . ECTOPIC PREGNANCY SURGERY    . MASTECTOMY Right 2010  . REDUCTION MAMMAPLASTY Left 2011    Social History   Tobacco Use  . Smoking status: Former Smoker    Years: 40.00    Types: Cigarettes    Last attempt to quit: 06/18/2012    Years since quitting: 5.5  . Smokeless tobacco: Never Used  Substance Use Topics  . Alcohol use: No    Alcohol/week: 0.0 oz  . Drug use: No     Medication list has been reviewed and updated.  Current Meds  Medication Sig  . aspirin EC 81 MG tablet Take 81 mg by mouth daily.  Marland Kitchen atorvastatin (LIPITOR) 10 MG tablet TAKE 1 TABLET(10 MG) BY MOUTH DAILY AT 6 PM  . glucose blood (ONE TOUCH ULTRA TEST) test strip USE AS DIRECTED TO TEST DAILY  . levothyroxine (SYNTHROID, LEVOTHROID) 125 MCG tablet TAKE 2 TABLETS(250 MCG) BY MOUTH DAILY  . lisinopril-hydrochlorothiazide (PRINZIDE,ZESTORETIC) 10-12.5 MG tablet TAKE 1 TABLET BY MOUTH DAILY  . metFORMIN (GLUCOPHAGE-XR) 500 MG 24 hr tablet TAKE 2 TABLETS BY  MOUTH TWICE DAILY  . MULTIPLE VITAMINS-MINERALS ER PO Take 1 tablet by mouth daily.  Kendra Walton DELICA LANCETS 16W MISC USE TWICE DAILY  . pregabalin (LYRICA) 50 MG capsule Take 1 capsule (50 mg total) by mouth 3 (three) times daily.  . traMADol (ULTRAM) 50 MG tablet TAKE 2 TABLETS BY MOUTH EVERY 8 HOURS AS NEEDED    PHQ 2/9 Scores 12/25/2017 09/21/2016 08/08/2015 12/06/2014  PHQ - 2 Score 0 0 0 0    Physical Exam  Constitutional: She is oriented to person, place, and time. She appears well-developed and well-nourished. No distress.  HENT:  Head: Normocephalic and atraumatic.  Right Ear: Tympanic membrane and ear canal normal.  Left Ear: Tympanic membrane and ear canal normal.  Nose: Right sinus exhibits no maxillary sinus tenderness. Left sinus exhibits no maxillary sinus tenderness.  Mouth/Throat: Uvula is midline and oropharynx is clear and moist.  Eyes: Conjunctivae and EOM are normal. Right eye exhibits no discharge. Left eye exhibits no discharge. No scleral icterus.  Neck: Normal range of motion. Carotid bruit is not present. No erythema present. No thyromegaly present.  Cardiovascular: Normal rate, regular rhythm, normal heart sounds and normal pulses.  Pulmonary/Chest: Effort normal. No respiratory distress. She has no wheezes. Left breast exhibits no nipple discharge.    Abdominal: Soft. Bowel sounds are normal. There is no hepatosplenomegaly. There is no tenderness. There is no CVA tenderness.  Musculoskeletal: Normal range of motion.  Lymphadenopathy:    She has no cervical adenopathy.    She has no axillary adenopathy.  Neurological: She is alert and oriented to person, place, and time. She has normal strength and normal reflexes. A sensory deficit is present. No cranial nerve deficit.  Skin: Skin is warm, dry and intact. No rash noted.  Psychiatric: She has a normal mood and affect. Her speech is normal and behavior is normal. Thought content normal.  Nursing note and vitals  reviewed.   BP 126/82 (BP Location: Left Arm, Patient Position: Sitting, Cuff Size: Large)   Pulse 71   Temp 98.1 F (36.7 C)   Ht 5\' 8"  (1.727 m)   Wt 249 lb (112.9 kg)   SpO2 96%   BMI 37.86 kg/m   Assessment and Plan: 1. Annual physical exam Normal exam except for weight - POCT urinalysis dipstick  2. Essential (primary) hypertension controlled - CBC with Differential/Platelet  3. Acquired hypothyroidism Supplemented, dose recently changed - TSH  4. Hyperlipidemia associated with type 2 diabetes mellitus ([place]) On statin therapy - Lipid panel  5. Uncontrolled type 2 diabetes mellitus with diabetic polyneuropathy, without long-term current use of insulin ([place]) Continue current therapy May take an extra lyrica occasionally if needed - Ambulatory referral to Podiatry - glucose blood (ONE TOUCH ULTRA TEST) test strip; Use to test BS twice a day  Dispense: 50 each; Refill: 12 - ONETOUCH DELICA LANCETS 30S MISC; 1 each by Other route 2 (two) times daily.  Dispense: 100 each; Refill: 3 - Comprehensive metabolic panel - Hemoglobin A1c  6. Need for pneumococcal vaccination - Pneumococcal polysaccharide vaccine 23-valent greater than or equal to 2yo subcutaneous/IM   Meds ordered this encounter  Medications  . glucose blood (ONE TOUCH ULTRA TEST) test strip    Sig: Use to test BS twice a day    Dispense:  50 each    Refill:  12    E11.9  . ONETOUCH DELICA LANCETS 92Z MISC    Sig: 1 each by Other route 2 (two) times daily.    Dispense:  100 each    Refill:  3    Partially dictated using Editor, commissioning. Any errors are unintentional.  Halina Maidens, MD [facility]  12/25/2017   There are no diagnoses linked to this encounter.

## 2017-12-26 LAB — CBC WITH DIFFERENTIAL/PLATELET
BASOS: 1 %
Basophils Absolute: 0.1 10*3/uL (ref 0.0–0.2)
EOS (ABSOLUTE): 0.2 10*3/uL (ref 0.0–0.4)
Eos: 2 %
Hematocrit: 34 % (ref 34.0–46.6)
Hemoglobin: 10.8 g/dL — ABNORMAL LOW (ref 11.1–15.9)
Immature Grans (Abs): 0 10*3/uL (ref 0.0–0.1)
Immature Granulocytes: 0 %
LYMPHS ABS: 3.1 10*3/uL (ref 0.7–3.1)
Lymphs: 32 %
MCH: 26.4 pg — AB (ref 26.6–33.0)
MCHC: 31.8 g/dL (ref 31.5–35.7)
MCV: 83 fL (ref 79–97)
MONOS ABS: 0.6 10*3/uL (ref 0.1–0.9)
Monocytes: 6 %
NEUTROS ABS: 5.7 10*3/uL (ref 1.4–7.0)
Neutrophils: 59 %
PLATELETS: 282 10*3/uL (ref 150–450)
RBC: 4.09 x10E6/uL (ref 3.77–5.28)
RDW: 15.1 % (ref 12.3–15.4)
WBC: 9.7 10*3/uL (ref 3.4–10.8)

## 2017-12-26 LAB — COMPREHENSIVE METABOLIC PANEL
A/G RATIO: 1.5 (ref 1.2–2.2)
ALT: 18 IU/L (ref 0–32)
AST: 17 IU/L (ref 0–40)
Albumin: 4.4 g/dL (ref 3.6–4.8)
Alkaline Phosphatase: 73 IU/L (ref 39–117)
BILIRUBIN TOTAL: 0.4 mg/dL (ref 0.0–1.2)
BUN/Creatinine Ratio: 20 (ref 12–28)
BUN: 19 mg/dL (ref 8–27)
CHLORIDE: 99 mmol/L (ref 96–106)
CO2: 25 mmol/L (ref 20–29)
Calcium: 9.5 mg/dL (ref 8.7–10.3)
Creatinine, Ser: 0.93 mg/dL (ref 0.57–1.00)
GFR calc non Af Amer: 63 mL/min/{1.73_m2} (ref >59)
GFR, EST AFRICAN AMERICAN: 73 mL/min/{1.73_m2} (ref >59)
Globulin, Total: 2.9 g/dL (ref 1.5–4.5)
Glucose: 86 mg/dL (ref 65–99)
Potassium: 5.1 mmol/L (ref 3.5–5.2)
Sodium: 141 mmol/L (ref 134–144)
Total Protein: 7.3 g/dL (ref 6.0–8.5)

## 2017-12-26 LAB — LIPID PANEL
CHOL/HDL RATIO: 3 ratio (ref 0.0–4.4)
Cholesterol, Total: 142 mg/dL (ref 100–199)
HDL: 48 mg/dL (ref >39)
LDL CALC: 73 mg/dL (ref 0–99)
TRIGLYCERIDES: 104 mg/dL (ref 0–149)
VLDL CHOLESTEROL CAL: 21 mg/dL (ref 5–40)

## 2017-12-26 LAB — HEMOGLOBIN A1C
Est. average glucose Bld gHb Est-mCnc: 131 mg/dL
Hgb A1c MFr Bld: 6.2 % — ABNORMAL HIGH (ref 4.8–5.6)

## 2017-12-26 LAB — TSH: TSH: 1.37 u[IU]/mL (ref 0.450–4.500)

## 2018-01-08 DIAGNOSIS — B351 Tinea unguium: Secondary | ICD-10-CM | POA: Diagnosis not present

## 2018-01-08 DIAGNOSIS — E1142 Type 2 diabetes mellitus with diabetic polyneuropathy: Secondary | ICD-10-CM | POA: Diagnosis not present

## 2018-01-08 DIAGNOSIS — B353 Tinea pedis: Secondary | ICD-10-CM | POA: Diagnosis not present

## 2018-01-14 ENCOUNTER — Other Ambulatory Visit: Payer: Self-pay

## 2018-01-14 ENCOUNTER — Telehealth: Payer: Self-pay

## 2018-01-14 ENCOUNTER — Other Ambulatory Visit: Payer: Self-pay | Admitting: Internal Medicine

## 2018-01-14 DIAGNOSIS — E1142 Type 2 diabetes mellitus with diabetic polyneuropathy: Secondary | ICD-10-CM

## 2018-01-14 MED ORDER — PREGABALIN 50 MG PO CAPS: 50.0000 mg | ORAL_CAPSULE | Freq: Three times a day (TID) | ORAL | 1 refills | Status: DC

## 2018-01-14 NOTE — Progress Notes (Signed)
ERROR

## 2018-01-14 NOTE — Telephone Encounter (Signed)
Patient needs refill on Lyrica per mail order pharmacy. Please send to [facility]- Insurance claims handler).   Thank you.

## 2018-01-31 ENCOUNTER — Other Ambulatory Visit: Payer: Self-pay | Admitting: Internal Medicine

## 2018-02-13 ENCOUNTER — Telehealth: Payer: Self-pay

## 2018-02-13 NOTE — Telephone Encounter (Signed)
Patient called asking that we FAX Rx for Lyrica 50 mg  6 month supply to [facility] and must have [place] ID on RX [id number] Pls call her after sending this in.

## 2018-02-14 ENCOUNTER — Other Ambulatory Visit: Payer: Self-pay | Admitting: Internal Medicine

## 2018-02-14 DIAGNOSIS — E1142 Type 2 diabetes mellitus with diabetic polyneuropathy: Secondary | ICD-10-CM

## 2018-02-14 MED ORDER — PREGABALIN 50 MG PO CAPS: 50.0000 mg | ORAL_CAPSULE | Freq: Three times a day (TID) | ORAL | 1 refills | Status: DC

## 2018-04-12 ENCOUNTER — Other Ambulatory Visit: Payer: Self-pay | Admitting: Internal Medicine

## 2018-04-12 MED ORDER — TRAMADOL HCL 50 MG PO TABS: 100.0000 mg | ORAL_TABLET | Freq: Three times a day (TID) | ORAL | 0 refills | Status: DC | PRN

## 2018-04-14 ENCOUNTER — Other Ambulatory Visit: Payer: Self-pay | Admitting: Internal Medicine

## 2018-04-14 MED ORDER — TRAMADOL HCL 50 MG PO TABS: 100.0000 mg | ORAL_TABLET | Freq: Three times a day (TID) | ORAL | 0 refills | Status: DC | PRN

## 2018-04-25 ENCOUNTER — Other Ambulatory Visit: Payer: Self-pay | Admitting: Internal Medicine

## 2018-04-29 ENCOUNTER — Ambulatory Visit (INDEPENDENT_AMBULATORY_CARE_PROVIDER_SITE_OTHER): Payer: Medicare Other | Admitting: Internal Medicine

## 2018-04-29 ENCOUNTER — Encounter: Payer: Self-pay | Admitting: Internal Medicine

## 2018-04-29 VITALS — BP 112/64 | HR 62 | Ht 68.0 in | Wt 246.0 lb

## 2018-04-29 DIAGNOSIS — I10 Essential (primary) hypertension: Secondary | ICD-10-CM | POA: Diagnosis not present

## 2018-04-29 DIAGNOSIS — R202 Paresthesia of skin: Secondary | ICD-10-CM | POA: Insufficient documentation

## 2018-04-29 DIAGNOSIS — IMO0002 Reserved for concepts with insufficient information to code with codable children: Secondary | ICD-10-CM

## 2018-04-29 DIAGNOSIS — Z23 Encounter for immunization: Secondary | ICD-10-CM | POA: Diagnosis not present

## 2018-04-29 DIAGNOSIS — E039 Hypothyroidism, unspecified: Secondary | ICD-10-CM

## 2018-04-29 DIAGNOSIS — E1142 Type 2 diabetes mellitus with diabetic polyneuropathy: Secondary | ICD-10-CM | POA: Diagnosis not present

## 2018-04-29 DIAGNOSIS — E1165 Type 2 diabetes mellitus with hyperglycemia: Secondary | ICD-10-CM | POA: Diagnosis not present

## 2018-04-29 NOTE — Progress Notes (Signed)
Date:  04/29/2018   Name:  Kendra Walton   DOB:  27-Dec-1950   MRN:  [id number]   Chief Complaint: Diabetes (Follow up. High dose flu. )  Diabetes  She presents for her follow-up diabetic visit. She has type 2 diabetes mellitus. Pertinent negatives for hypoglycemia include no headaches or tremors. Pertinent negatives for diabetes include no chest pain, no fatigue, no polydipsia and no polyuria. Symptoms are stable. Current diabetic treatment includes oral agent (monotherapy). She is compliant with treatment all of the time. Her weight is stable. She is following a generally healthy diet. There is no change in her home blood glucose trend. An ACE inhibitor/angiotensin II receptor blocker is being taken. Eye exam is current.  Hypertension  This is a chronic problem. The problem is controlled. Pertinent negatives include no chest pain, headaches, palpitations or shortness of breath. Past treatments include ACE inhibitors and diuretics. The current treatment provides significant improvement. Identifiable causes of hypertension include a thyroid problem.  Thyroid Problem  Presents for follow-up visit. Patient reports no depressed mood, diaphoresis, fatigue, leg swelling, palpitations, tremors or weight gain. The symptoms have been stable.  Tingling in left arm - noted intermittently at random times.  No particular activity causes sx.  It does not wake from sleep.  No weakness in the hand.  Tingles in index finger and forearm.  Lab Results  Component Value Date   HGBA1C 6.2 (H) 12/25/2017   Lab Results  Component Value Date   TSH 1.370 12/25/2017   Lab Results  Component Value Date   CREATININE 0.93 12/25/2017   BUN 19 12/25/2017   NA 141 12/25/2017   K 5.1 12/25/2017   CL 99 12/25/2017   CO2 25 12/25/2017     Review of Systems  Constitutional: Negative for appetite change, diaphoresis, fatigue, fever, unexpected weight change and weight gain.  HENT: Negative for tinnitus  and trouble swallowing.   Eyes: Negative for visual disturbance.  Respiratory: Negative for cough, chest tightness and shortness of breath.   Cardiovascular: Negative for chest pain, palpitations and leg swelling.  Gastrointestinal: Negative for abdominal pain.  Endocrine: Negative for polydipsia and polyuria.  Genitourinary: Negative for dysuria and hematuria.  Musculoskeletal: Positive for arthralgias (hands and finger).  Neurological: Positive for numbness (tingling intermittent on left forearm). Negative for tremors and headaches.  Psychiatric/Behavioral: Negative for dysphoric mood.    Patient Active Problem List   Diagnosis Date Noted  . Aortic stenosis, moderate 04/05/2017  . Post-menopausal bleeding 04/13/2016  . Hearing decreased 12/13/2015  . Leukocytosis 09/15/2015  . Absolute anemia 09/15/2015  . Hyperlipidemia associated with type 2 diabetes mellitus ([place]) 09/14/2015  . History of breast cancer in female 04/07/2015  . Acquired hypothyroidism 10/04/2014  . Essential (primary) hypertension 10/04/2014  . Arthritis of knee, degenerative 10/04/2014  . Uncontrolled type 2 diabetes mellitus with diabetic polyneuropathy, without long-term current use of insulin ([place]) 10/04/2014    No Known Allergies  Past Surgical History:  Procedure Laterality Date  . CESAREAN SECTION    . CHOLECYSTECTOMY  2005  . ECTOPIC PREGNANCY SURGERY    . MASTECTOMY Right 2010  . REDUCTION MAMMAPLASTY Left 2011    Social History   Tobacco Use  . Smoking status: Former Smoker    Years: 40.00    Types: Cigarettes    Last attempt to quit: 06/18/2012    Years since quitting: 5.8  . Smokeless tobacco: Never Used  Substance Use Topics  . Alcohol use: No  Alcohol/week: 0.0 standard drinks  . Drug use: No     Medication list has been reviewed and updated.  Current Meds  Medication Sig  . aspirin EC 81 MG tablet Take 81 mg by mouth daily.  Marland Kitchen atorvastatin (LIPITOR) 10 MG tablet TAKE 1  TABLET(10 MG) BY MOUTH DAILY AT 6 PM  . glucose blood (ONE TOUCH ULTRA TEST) test strip Use to test BS twice a day  . levothyroxine (SYNTHROID, LEVOTHROID) 125 MCG tablet TAKE 2 TABLETS(250 MCG) BY MOUTH DAILY (Patient taking differently: Take 125 mcg by mouth daily before breakfast. )  . lisinopril-hydrochlorothiazide (PRINZIDE,ZESTORETIC) 10-12.5 MG tablet TAKE 1 TABLET BY MOUTH DAILY  . metFORMIN (GLUCOPHAGE-XR) 500 MG 24 hr tablet TAKE 2 TABLETS BY MOUTH TWICE DAILY  . MULTIPLE VITAMINS-MINERALS ER PO Take 1 tablet by mouth daily.  Glory Rosebush DELICA LANCETS 58I MISC 1 each by Other route 2 (two) times daily.  . pregabalin (LYRICA) 50 MG capsule Take 1 capsule (50 mg total) by mouth 3 (three) times daily.  . traMADol (ULTRAM) 50 MG tablet Take 2 tablets (100 mg total) by mouth 3 (three) times daily as needed.    PHQ 2/9 Scores 04/29/2018 12/25/2017 09/21/2016 08/08/2015  PHQ - 2 Score 0 0 0 0    Physical Exam  Constitutional: She is oriented to person, place, and time. She appears well-developed. No distress.  HENT:  Head: Normocephalic and atraumatic.  Neck: Normal range of motion. Neck supple. Carotid bruit is not present.  Cardiovascular: Normal rate, regular rhythm and normal heart sounds.  Pulmonary/Chest: Effort normal and breath sounds normal. No respiratory distress.  Musculoskeletal: Normal range of motion.  OA changes fingers both hands  Neurological: She is alert and oriented to person, place, and time. She has normal strength and normal reflexes.  Tinel's and Phalen's negative on left - pt reports some sx about 30 sec after the maneuvers  Skin: Skin is warm and dry. No rash noted.  Psychiatric: She has a normal mood and affect. Her behavior is normal. Thought content normal.  Nursing note and vitals reviewed.   BP 112/64 (BP Location: Right Arm, Patient Position: Sitting, Cuff Size: Large)   Pulse 62   Ht 5\' 8"  (1.727 m)   Wt 246 lb (111.6 kg)   SpO2 97%   BMI 37.40  kg/m   Assessment and Plan: 1. Essential (primary) hypertension controlled  2. Uncontrolled type 2 diabetes mellitus with diabetic polyneuropathy, without long-term current use of insulin (HCC) Stable, continue efforts at low carb diet - Hemoglobin A1c  3. Acquired hypothyroidism supplemented  4. Encounter for immunization - Flu vaccine HIGH DOSE PF  5. Tingling of left upper extremity May be early CTS - avoid repetitive activities Call if worsening   Partially dictated using Editor, commissioning. Any errors are unintentional.  Halina Maidens, MD [facility]  04/29/2018

## 2018-04-30 LAB — HEMOGLOBIN A1C
Est. average glucose Bld gHb Est-mCnc: 123 mg/dL
Hgb A1c MFr Bld: 5.9 % — ABNORMAL HIGH (ref 4.8–5.6)

## 2018-05-12 ENCOUNTER — Other Ambulatory Visit: Payer: Self-pay | Admitting: Internal Medicine

## 2018-06-06 ENCOUNTER — Telehealth: Payer: Self-pay | Admitting: Internal Medicine

## 2018-06-06 NOTE — Telephone Encounter (Signed)
Called patient to schedule AWV-S.  Last AWV 09/21/16.  Patient declined appointment. lec

## 2018-06-23 ENCOUNTER — Telehealth: Payer: Self-pay

## 2018-06-23 ENCOUNTER — Other Ambulatory Visit: Payer: Self-pay | Admitting: Internal Medicine

## 2018-06-23 ENCOUNTER — Other Ambulatory Visit: Payer: Self-pay

## 2018-06-23 DIAGNOSIS — E1142 Type 2 diabetes mellitus with diabetic polyneuropathy: Secondary | ICD-10-CM

## 2018-06-23 MED ORDER — PREGABALIN 50 MG PO CAPS: 50.0000 mg | ORAL_CAPSULE | Freq: Three times a day (TID) | ORAL | 0 refills | Status: DC

## 2018-06-23 NOTE — Telephone Encounter (Signed)
Patient called stating she is about to run out of Lyrica and they have not shipped her order for it even tho it has been approved. Wanted to know if we could send in 90 days to [facility] in [place] and she will use good Rx app.  Please Advise. Patient informed it is being sent.

## 2018-07-09 DIAGNOSIS — E669 Obesity, unspecified: Secondary | ICD-10-CM | POA: Insufficient documentation

## 2018-07-09 DIAGNOSIS — R079 Chest pain, unspecified: Secondary | ICD-10-CM | POA: Diagnosis not present

## 2018-07-09 DIAGNOSIS — I350 Nonrheumatic aortic (valve) stenosis: Secondary | ICD-10-CM | POA: Diagnosis not present

## 2018-07-09 DIAGNOSIS — I10 Essential (primary) hypertension: Secondary | ICD-10-CM | POA: Diagnosis not present

## 2018-07-09 DIAGNOSIS — E785 Hyperlipidemia, unspecified: Secondary | ICD-10-CM | POA: Diagnosis not present

## 2018-07-12 ENCOUNTER — Other Ambulatory Visit: Payer: Self-pay | Admitting: Internal Medicine

## 2018-07-28 DIAGNOSIS — I10 Essential (primary) hypertension: Secondary | ICD-10-CM | POA: Diagnosis not present

## 2018-07-28 DIAGNOSIS — I350 Nonrheumatic aortic (valve) stenosis: Secondary | ICD-10-CM | POA: Diagnosis not present

## 2018-07-28 DIAGNOSIS — R079 Chest pain, unspecified: Secondary | ICD-10-CM | POA: Diagnosis not present

## 2018-08-04 DIAGNOSIS — E785 Hyperlipidemia, unspecified: Secondary | ICD-10-CM | POA: Diagnosis not present

## 2018-08-04 DIAGNOSIS — I10 Essential (primary) hypertension: Secondary | ICD-10-CM | POA: Diagnosis not present

## 2018-08-04 DIAGNOSIS — I350 Nonrheumatic aortic (valve) stenosis: Secondary | ICD-10-CM | POA: Diagnosis not present

## 2018-08-04 DIAGNOSIS — I429 Cardiomyopathy, unspecified: Secondary | ICD-10-CM | POA: Diagnosis not present

## 2018-08-05 ENCOUNTER — Other Ambulatory Visit: Payer: Self-pay | Admitting: Cardiology

## 2018-08-06 ENCOUNTER — Other Ambulatory Visit: Payer: Self-pay

## 2018-08-06 ENCOUNTER — Ambulatory Visit
Admission: RE | Admit: 2018-08-06 | Discharge: 2018-08-06 | Disposition: A | Discharge status: Home / Self Care | Payer: Medicare Other | Attending: Cardiology | Admitting: Cardiology

## 2018-08-06 ENCOUNTER — Encounter
Admission: RE | Disposition: A | Discharge status: Home / Self Care | Payer: Self-pay | Source: Home / Self Care | Attending: Cardiology

## 2018-08-06 ENCOUNTER — Encounter: Payer: Self-pay | Admitting: Internal Medicine

## 2018-08-06 DIAGNOSIS — Z87891 Personal history of nicotine dependence: Secondary | ICD-10-CM | POA: Diagnosis not present

## 2018-08-06 DIAGNOSIS — R0789 Other chest pain: Secondary | ICD-10-CM | POA: Diagnosis present

## 2018-08-06 DIAGNOSIS — Z7984 Long term (current) use of oral hypoglycemic drugs: Secondary | ICD-10-CM | POA: Diagnosis not present

## 2018-08-06 DIAGNOSIS — E785 Hyperlipidemia, unspecified: Secondary | ICD-10-CM | POA: Diagnosis not present

## 2018-08-06 DIAGNOSIS — I5023 Acute on chronic systolic (congestive) heart failure: Secondary | ICD-10-CM | POA: Diagnosis not present

## 2018-08-06 DIAGNOSIS — E079 Disorder of thyroid, unspecified: Secondary | ICD-10-CM | POA: Diagnosis not present

## 2018-08-06 DIAGNOSIS — Z7982 Long term (current) use of aspirin: Secondary | ICD-10-CM | POA: Diagnosis not present

## 2018-08-06 DIAGNOSIS — Z79899 Other long term (current) drug therapy: Secondary | ICD-10-CM | POA: Insufficient documentation

## 2018-08-06 DIAGNOSIS — I060 Rheumatic aortic stenosis: Secondary | ICD-10-CM | POA: Diagnosis not present

## 2018-08-06 DIAGNOSIS — I2584 Coronary atherosclerosis due to calcified coronary lesion: Secondary | ICD-10-CM

## 2018-08-06 DIAGNOSIS — I2510 Atherosclerotic heart disease of native coronary artery without angina pectoris: Secondary | ICD-10-CM

## 2018-08-06 DIAGNOSIS — M1990 Unspecified osteoarthritis, unspecified site: Secondary | ICD-10-CM | POA: Insufficient documentation

## 2018-08-06 DIAGNOSIS — Z9011 Acquired absence of right breast and nipple: Secondary | ICD-10-CM | POA: Insufficient documentation

## 2018-08-06 DIAGNOSIS — Z79890 Hormone replacement therapy (postmenopausal): Secondary | ICD-10-CM | POA: Diagnosis not present

## 2018-08-06 DIAGNOSIS — R0602 Shortness of breath: Secondary | ICD-10-CM | POA: Insufficient documentation

## 2018-08-06 DIAGNOSIS — R079 Chest pain, unspecified: Secondary | ICD-10-CM

## 2018-08-06 DIAGNOSIS — E1140 Type 2 diabetes mellitus with diabetic neuropathy, unspecified: Secondary | ICD-10-CM | POA: Insufficient documentation

## 2018-08-06 DIAGNOSIS — I10 Essential (primary) hypertension: Secondary | ICD-10-CM | POA: Diagnosis not present

## 2018-08-06 DIAGNOSIS — I25119 Atherosclerotic heart disease of native coronary artery with unspecified angina pectoris: Secondary | ICD-10-CM | POA: Diagnosis not present

## 2018-08-06 DIAGNOSIS — I350 Nonrheumatic aortic (valve) stenosis: Secondary | ICD-10-CM | POA: Diagnosis not present

## 2018-08-06 HISTORY — DX: Coronary atherosclerosis due to calcified coronary lesion: I25.84

## 2018-08-06 HISTORY — DX: Atherosclerotic heart disease of native coronary artery without angina pectoris: I25.10

## 2018-08-06 HISTORY — PX: RIGHT/LEFT HEART CATH AND CORONARY ANGIOGRAPHY: CATH118266

## 2018-08-06 LAB — GLUCOSE, CAPILLARY: Glucose-Capillary: 135 mg/dL — ABNORMAL HIGH (ref 70–99)

## 2018-08-06 SURGERY — RIGHT/LEFT HEART CATH AND CORONARY ANGIOGRAPHY
Anesthesia: Moderate Sedation | Laterality: Bilateral

## 2018-08-06 MED ORDER — SODIUM CHLORIDE 0.9 % IV SOLN: INTRAVENOUS | Status: DC

## 2018-08-06 MED ORDER — SODIUM CHLORIDE 0.9% FLUSH: 3.0000 mL | Freq: Two times a day (BID) | INTRAVENOUS | Status: DC

## 2018-08-06 MED ORDER — FENTANYL CITRATE (PF) 100 MCG/2ML IJ SOLN
INTRAMUSCULAR | Status: DC | PRN
  Administered 2018-08-06 (×2): 25 ug via INTRAVENOUS

## 2018-08-06 MED ORDER — SODIUM CHLORIDE 0.9% FLUSH: 3.0000 mL | INTRAVENOUS | Status: DC | PRN

## 2018-08-06 MED ORDER — HEPARIN (PORCINE) IN NACL 1000-0.9 UT/500ML-% IV SOLN
INTRAVENOUS | Status: AC
  Filled 2018-08-06: qty 1000

## 2018-08-06 MED ORDER — MIDAZOLAM HCL 2 MG/2ML IJ SOLN
INTRAMUSCULAR | Status: DC | PRN
  Administered 2018-08-06 (×2): 0.5 mg via INTRAVENOUS

## 2018-08-06 MED ORDER — SODIUM CHLORIDE 0.9 % IV SOLN: 250.0000 mL | INTRAVENOUS | Status: DC | PRN

## 2018-08-06 MED ORDER — SODIUM CHLORIDE 0.9 % IV SOLN
INTRAVENOUS | Status: DC
  Administered 2018-08-06: 08:00:00 via INTRAVENOUS

## 2018-08-06 MED ORDER — FUROSEMIDE 10 MG/ML IJ SOLN
INTRAMUSCULAR | Status: DC | PRN
  Administered 2018-08-06: 20 mg via INTRAVENOUS

## 2018-08-06 MED ORDER — MIDAZOLAM HCL 2 MG/2ML IJ SOLN
INTRAMUSCULAR | Status: AC
  Filled 2018-08-06: qty 2

## 2018-08-06 MED ORDER — ACETAMINOPHEN 325 MG PO TABS: 650.0000 mg | ORAL_TABLET | ORAL | Status: DC | PRN

## 2018-08-06 MED ORDER — IOPAMIDOL (ISOVUE-300) INJECTION 61%
INTRAVENOUS | Status: DC | PRN
  Administered 2018-08-06: 60 mL via INTRA_ARTERIAL

## 2018-08-06 MED ORDER — FENTANYL CITRATE (PF) 100 MCG/2ML IJ SOLN
INTRAMUSCULAR | Status: AC
  Filled 2018-08-06: qty 2

## 2018-08-06 MED ORDER — ONDANSETRON HCL 4 MG/2ML IJ SOLN: 4.0000 mg | Freq: Four times a day (QID) | INTRAMUSCULAR | Status: DC | PRN

## 2018-08-06 MED ORDER — FUROSEMIDE 10 MG/ML IJ SOLN
INTRAMUSCULAR | Status: AC
  Filled 2018-08-06: qty 4

## 2018-08-06 MED ORDER — ASPIRIN 81 MG PO CHEW: 81.0000 mg | CHEWABLE_TABLET | ORAL | Status: DC

## 2018-08-06 SURGICAL SUPPLY — 14 items
CANNULA 5F STIFF (CANNULA) ×3 IMPLANT
CATH INFINITI 5FR ANG PIGTAIL (CATHETERS) ×3 IMPLANT
CATH INFINITI 5FR JL4 (CATHETERS) ×3 IMPLANT
CATH INFINITI 5FR JL5 (CATHETERS) ×3 IMPLANT
CATH INFINITI JR4 5F (CATHETERS) ×3 IMPLANT
CATH SWANZ 7F THERMO (CATHETERS) ×3 IMPLANT
KIT MANI 3VAL PERCEP (MISCELLANEOUS) ×3 IMPLANT
KIT RIGHT HEART (MISCELLANEOUS) ×3 IMPLANT
NEEDLE PERC 18GX7CM (NEEDLE) ×3 IMPLANT
PACK CARDIAC CATH (CUSTOM PROCEDURE TRAY) ×3 IMPLANT
SHEATH AVANTI 5FR X 11CM (SHEATH) ×3 IMPLANT
SHEATH AVANTI 6FR X 11CM (SHEATH) ×3 IMPLANT
SHEATH AVANTI 7FRX11 (SHEATH) ×3 IMPLANT
WIRE GUIDERIGHT .035X150 (WIRE) ×3 IMPLANT

## 2018-08-06 NOTE — H&P (Signed)
Chief Complaint: Chief Complaint  Patient presents with  . Follow-up  stress test and echo  Date of Service: 08/04/2018 Date of Birth: 07/12/1950 PCP: Eulah Pont, MD  History of Present Illness: Kendra Walton is a 68 y.o.female patient who presents for a follow-up visit. Had moderate aortic stenosis with preserved LV function in 2018. Now is more symptomatic. Echo was done along with a functional study. EF is markedly reduced in 30-35 % range globally. Her valve area was 0.58 cm with a peak gradient of 78.3 and mean gradient 48.2 with a peak flow of 4.4 to 5 m/s. Functional study also showed reduced LV function. Patient is symptomatic with shortness of breath with activity with right-sided chest pain. Risk and benefit of left heart cath were explained to the patient. Past Medical and Surgical History  Past Medical History Past Medical History:  Diagnosis Date  . Breast cancer (CMS-HCC)  . Diabetes mellitus type 2, uncomplicated (CMS-HCC)  . Hyperlipidemia  . Hypertension  . Neuropathy  . Osteoarthritis  . Thyroid disease   Past Surgical History She has a past surgical history that includes Mastectomy Partial (Right, 2010); Cholecystectomy; and c section.   Medications and Allergies  Current Medications  Current Outpatient Medications  Medication Sig Dispense Refill  . aspirin 81 MG EC tablet Take by mouth.  Marland Kitchen atorvastatin (LIPITOR) 10 MG tablet Take by mouth.  . blood glucose diagnostic (GLUCOSE BLOOD) test strip USE AS DIRECTED TO TEST DAILY  . folic acid/multivit-min/lutein (CENTRUM SILVER ORAL) Take by mouth.  . lancets (ONETOUCH DELICA LANCETS) 33 gauge Misc USE TWICE DAILY  . levothyroxine (SYNTHROID, LEVOTHROID) 125 MCG tablet Take 175 mcg by mouth once daily  . lisinopril-hydrochlorothiazide (PRINZIDE,ZESTORETIC) 10-12.5 mg tablet Take by mouth.  . metFORMIN (GLUCOPHAGE-XR) 500 MG XR tablet TAKE 2 TABLETS BY MOUTH TWICE DAILY  . naproxen (NAPROSYN) 500 MG  tablet Take 500 mg by mouth once daily  . pregabalin (LYRICA) 50 MG capsule Take by mouth.  . traMADol (ULTRAM) 50 mg tablet TAKE 2 TABLETS BY MOUTH EVERY 8 HOURS AS NEEDED   No current facility-administered medications for this visit.   Allergies: Patient has no known allergies.  Social and Family History  Social History reports that she has quit smoking. She has never used smokeless tobacco. She reports that she does not drink alcohol or use drugs.  Family History Family History  Problem Relation Age of Onset  . Emphysema Mother  . Alcohol abuse Father  . Alcohol abuse Brother   Review of Systems  Review of Systems  Constitutional: Negative for chills, diaphoresis, fever, malaise/fatigue and weight loss.  HENT: Negative for congestion, ear discharge, hearing loss and tinnitus.  Eyes: Negative for blurred vision.  Respiratory: Negative for cough, hemoptysis, sputum production and wheezing.  Cardiovascular: Negative for palpitations, orthopnea, claudication, leg swelling and PND.  Gastrointestinal: Negative for abdominal pain, blood in stool, constipation, diarrhea, heartburn, melena, nausea and vomiting.  Genitourinary: Negative for dysuria, frequency, hematuria and urgency.  Musculoskeletal: Negative for back pain, falls, joint pain and myalgias.  Skin: Negative for itching and rash.  Neurological: Negative for tingling, focal weakness, loss of consciousness, weakness and headaches.  Endo/Heme/Allergies: Negative for polydipsia. Does not bruise/bleed easily.  Psychiatric/Behavioral: Negative for depression, memory loss and substance abuse. The patient is not nervous/anxious.   Physical Examination   Vitals:BP 120/78  Pulse 84  Resp 16  Ht 172.7 cm (5\' 8" )  Wt (!) 113.6 kg (250 lb 7.1 oz)  BMI 38.08  kg/m  Ht:172.7 cm (5\' 8" ) Wt:(!) 113.6 kg (250 lb 7.1 oz) CHY:IFOY surface area is 2.33 meters squared. Body mass index is 38.08 kg/m.  Wt Readings from Last 3 Encounters:   08/04/18 (!) 113.6 kg (250 lb 7.1 oz)  07/09/18 (!) 114 kg (251 lb 5.2 oz)  01/08/18 (!) 110.7 kg (244 lb)   BP Readings from Last 3 Encounters:  08/04/18 120/78  07/09/18 130/80  01/08/18 126/72   General appearance appears in no acute distress  Head Mouth and Eye exam Normocephalic, without obvious abnormality, atraumatic Dentition is good Eyes appear anicteric   LUNGS Breath Sounds: Normal Percussion: Normal  CARDIOVASCULAR JVP CV wave: no HJR: no Elevation at 90 degrees: None Carotid Pulse: normal pulsation bilaterally Bruit: None Apex: apical impulse normal  Auscultation Rhythm: normal sinus rhythm S1: normal S2: normal Clicks: no Rub: no Murmurs: 3/6 low pitched crescendo-decrescendo harsh at 2nd left intercostal space, at upper right sternal border, radiates to carotids  Gallop: None ABDOMEN Liver enlargement: no Pulsatile aorta: no Ascites: no Bruits: no  EXTREMITIES Clubbing: no Edema: trace to 1+ bilateral pedal edema Pulses: peripheral pulses symmetrical Femoral Bruits: no Amputation: no SKIN Rash: no Cyanosis: no Embolic phemonenon: no Bruising: no NEURO Alert and Oriented to person, place and time: yes Non focal: yes  PSYCH: Pt appears to have normal affect  LABS REVIEWED Last 3 CBC results: No results found for: WBC No results found for: HGB No results found for: HCT  No results found for: PLT  No results found for: CREATININE, BUN, NA, K, CL, CO2  No results found for: HGBA1C  No results found for: HDL No results found for: LDLCALC No results found for: TRIG  No results found for: ALT, AST, GGT, ALKPHOS, BILITOT  No results found for: TSH  Diagnostic Studies Reviewed:  EKG EKG demonstrated normal sinus rhythm, nonspecific ST and T waves changes.  Assessment and Plan   68 y.o. female with  ICD-10-CM ICD-9-CM  1. Aortic systolic murmur on examination-murmur consistent with aortic stenosis. Valve area is reduced  with critical peak flow even with reduced LV function. Will need right left heart cath to evaluate coronary anatomy given cardiomyopathy as well as evaluate her right-sided pressures. Will need consideration of valve replacement and/or coronary revascularization depending on her anatomy. I35.8 424.1  2. Essential (primary) hypertension continue with lisinopril-hydrochlorothiazide. I10 401.9   Return if symptoms worsen or fail to improve.  These notes generated with voice recognition software. I apologize for typographical errors.  Sydnee Levans, MD   Pt seen and examined. No change from above.

## 2018-08-08 DIAGNOSIS — E119 Type 2 diabetes mellitus without complications: Secondary | ICD-10-CM | POA: Diagnosis not present

## 2018-08-11 DIAGNOSIS — Z9011 Acquired absence of right breast and nipple: Secondary | ICD-10-CM | POA: Diagnosis not present

## 2018-08-11 DIAGNOSIS — E872 Acidosis: Secondary | ICD-10-CM | POA: Diagnosis not present

## 2018-08-11 DIAGNOSIS — I519 Heart disease, unspecified: Secondary | ICD-10-CM | POA: Diagnosis not present

## 2018-08-11 DIAGNOSIS — I25119 Atherosclerotic heart disease of native coronary artery with unspecified angina pectoris: Secondary | ICD-10-CM | POA: Diagnosis not present

## 2018-08-11 DIAGNOSIS — I739 Peripheral vascular disease, unspecified: Secondary | ICD-10-CM | POA: Diagnosis not present

## 2018-08-11 DIAGNOSIS — R0609 Other forms of dyspnea: Secondary | ICD-10-CM | POA: Diagnosis not present

## 2018-08-11 DIAGNOSIS — G44209 Tension-type headache, unspecified, not intractable: Secondary | ICD-10-CM | POA: Diagnosis not present

## 2018-08-11 DIAGNOSIS — E785 Hyperlipidemia, unspecified: Secondary | ICD-10-CM | POA: Diagnosis not present

## 2018-08-11 DIAGNOSIS — I10 Essential (primary) hypertension: Secondary | ICD-10-CM | POA: Diagnosis not present

## 2018-08-11 DIAGNOSIS — I428 Other cardiomyopathies: Secondary | ICD-10-CM | POA: Diagnosis not present

## 2018-08-11 DIAGNOSIS — E1129 Type 2 diabetes mellitus with other diabetic kidney complication: Secondary | ICD-10-CM | POA: Diagnosis not present

## 2018-08-11 DIAGNOSIS — R0602 Shortness of breath: Secondary | ICD-10-CM | POA: Diagnosis not present

## 2018-08-11 DIAGNOSIS — E1142 Type 2 diabetes mellitus with diabetic polyneuropathy: Secondary | ICD-10-CM | POA: Diagnosis not present

## 2018-08-11 DIAGNOSIS — E1159 Type 2 diabetes mellitus with other circulatory complications: Secondary | ICD-10-CM | POA: Diagnosis not present

## 2018-08-11 DIAGNOSIS — D72829 Elevated white blood cell count, unspecified: Secondary | ICD-10-CM | POA: Diagnosis not present

## 2018-08-11 DIAGNOSIS — M1990 Unspecified osteoarthritis, unspecified site: Secondary | ICD-10-CM | POA: Diagnosis not present

## 2018-08-11 DIAGNOSIS — R001 Bradycardia, unspecified: Secondary | ICD-10-CM | POA: Diagnosis not present

## 2018-08-11 DIAGNOSIS — I272 Pulmonary hypertension, unspecified: Secondary | ICD-10-CM | POA: Diagnosis not present

## 2018-08-11 DIAGNOSIS — R069 Unspecified abnormalities of breathing: Secondary | ICD-10-CM | POA: Diagnosis not present

## 2018-08-11 DIAGNOSIS — R0603 Acute respiratory distress: Secondary | ICD-10-CM | POA: Diagnosis not present

## 2018-08-11 DIAGNOSIS — J811 Chronic pulmonary edema: Secondary | ICD-10-CM | POA: Diagnosis not present

## 2018-08-11 DIAGNOSIS — I340 Nonrheumatic mitral (valve) insufficiency: Secondary | ICD-10-CM | POA: Diagnosis not present

## 2018-08-11 DIAGNOSIS — J9601 Acute respiratory failure with hypoxia: Secondary | ICD-10-CM | POA: Diagnosis not present

## 2018-08-11 DIAGNOSIS — E278 Other specified disorders of adrenal gland: Secondary | ICD-10-CM | POA: Diagnosis not present

## 2018-08-11 DIAGNOSIS — R0902 Hypoxemia: Secondary | ICD-10-CM | POA: Diagnosis not present

## 2018-08-11 DIAGNOSIS — I5023 Acute on chronic systolic (congestive) heart failure: Secondary | ICD-10-CM | POA: Diagnosis not present

## 2018-08-11 DIAGNOSIS — I517 Cardiomegaly: Secondary | ICD-10-CM | POA: Diagnosis not present

## 2018-08-11 DIAGNOSIS — R54 Age-related physical debility: Secondary | ICD-10-CM | POA: Diagnosis not present

## 2018-08-11 DIAGNOSIS — I350 Nonrheumatic aortic (valve) stenosis: Secondary | ICD-10-CM | POA: Diagnosis not present

## 2018-08-11 DIAGNOSIS — R61 Generalized hyperhidrosis: Secondary | ICD-10-CM | POA: Diagnosis not present

## 2018-08-11 DIAGNOSIS — Z006 Encounter for examination for normal comparison and control in clinical research program: Secondary | ICD-10-CM | POA: Diagnosis not present

## 2018-08-11 DIAGNOSIS — I110 Hypertensive heart disease with heart failure: Secondary | ICD-10-CM | POA: Diagnosis not present

## 2018-08-11 DIAGNOSIS — E1165 Type 2 diabetes mellitus with hyperglycemia: Secondary | ICD-10-CM | POA: Diagnosis not present

## 2018-08-11 DIAGNOSIS — E1151 Type 2 diabetes mellitus with diabetic peripheral angiopathy without gangrene: Secondary | ICD-10-CM | POA: Diagnosis not present

## 2018-08-11 DIAGNOSIS — R000 Tachycardia, unspecified: Secondary | ICD-10-CM | POA: Diagnosis not present

## 2018-08-11 DIAGNOSIS — D509 Iron deficiency anemia, unspecified: Secondary | ICD-10-CM | POA: Diagnosis not present

## 2018-08-11 DIAGNOSIS — E039 Hypothyroidism, unspecified: Secondary | ICD-10-CM | POA: Diagnosis not present

## 2018-08-11 DIAGNOSIS — N289 Disorder of kidney and ureter, unspecified: Secondary | ICD-10-CM | POA: Diagnosis not present

## 2018-08-11 DIAGNOSIS — R918 Other nonspecific abnormal finding of lung field: Secondary | ICD-10-CM | POA: Diagnosis not present

## 2018-08-11 DIAGNOSIS — E279 Disorder of adrenal gland, unspecified: Secondary | ICD-10-CM | POA: Diagnosis not present

## 2018-08-11 DIAGNOSIS — I447 Left bundle-branch block, unspecified: Secondary | ICD-10-CM | POA: Diagnosis not present

## 2018-08-11 DIAGNOSIS — E119 Type 2 diabetes mellitus without complications: Secondary | ICD-10-CM | POA: Diagnosis not present

## 2018-08-11 DIAGNOSIS — R079 Chest pain, unspecified: Secondary | ICD-10-CM | POA: Diagnosis not present

## 2018-08-18 DIAGNOSIS — Z952 Presence of prosthetic heart valve: Secondary | ICD-10-CM

## 2018-08-18 HISTORY — PX: TRANSCATHETER AORTIC VALVE REPLACEMENT, TRANSAPICAL: SHX6401

## 2018-08-18 HISTORY — DX: Presence of prosthetic heart valve: Z95.2

## 2018-08-22 DIAGNOSIS — I5023 Acute on chronic systolic (congestive) heart failure: Secondary | ICD-10-CM | POA: Diagnosis not present

## 2018-08-29 ENCOUNTER — Ambulatory Visit: Payer: Medicare Other | Admitting: Internal Medicine

## 2018-09-02 ENCOUNTER — Encounter: Payer: Self-pay | Admitting: Internal Medicine

## 2018-09-02 ENCOUNTER — Ambulatory Visit (INDEPENDENT_AMBULATORY_CARE_PROVIDER_SITE_OTHER): Payer: Medicare Other | Admitting: Internal Medicine

## 2018-09-02 ENCOUNTER — Other Ambulatory Visit: Payer: Self-pay

## 2018-09-02 VITALS — BP 112/78 | HR 75 | Resp 16 | Ht 68.0 in | Wt 255.0 lb

## 2018-09-02 DIAGNOSIS — E1165 Type 2 diabetes mellitus with hyperglycemia: Secondary | ICD-10-CM

## 2018-09-02 DIAGNOSIS — Z952 Presence of prosthetic heart valve: Secondary | ICD-10-CM | POA: Diagnosis not present

## 2018-09-02 DIAGNOSIS — E278 Other specified disorders of adrenal gland: Secondary | ICD-10-CM

## 2018-09-02 DIAGNOSIS — IMO0002 Reserved for concepts with insufficient information to code with codable children: Secondary | ICD-10-CM

## 2018-09-02 DIAGNOSIS — E1142 Type 2 diabetes mellitus with diabetic polyneuropathy: Secondary | ICD-10-CM

## 2018-09-02 DIAGNOSIS — I10 Essential (primary) hypertension: Secondary | ICD-10-CM | POA: Diagnosis not present

## 2018-09-02 HISTORY — DX: Other specified disorders of adrenal gland: E27.8

## 2018-09-02 NOTE — Progress Notes (Signed)
Date:  09/02/2018   Name:  Kendra Walton   DOB:  1950/08/13   MRN:  [id number]   Chief Complaint: Hypertension (wants to change meds and Stop clopidogrel. Furosemide, and Lisinopril ); Diabetes (BS Range 111-136); and Shoulder Pain  Hypertension  This is a chronic problem. The problem is controlled. Pertinent negatives include no chest pain, headaches, palpitations or shortness of breath. Past treatments include ACE inhibitors and diuretics. The current treatment provides significant improvement. There are no compliance problems.   Diabetes  She presents for her follow-up diabetic visit. She has type 2 diabetes mellitus. Her disease course has been stable. Pertinent negatives for hypoglycemia include no headaches or tremors. Pertinent negatives for diabetes include no chest pain, no fatigue, no polydipsia and no polyuria. There are no hypoglycemic complications. Current diabetic treatment includes oral agent (monotherapy) (metformin increased to bid).  AVR - she is 2 weeks s/p TAVR at [facility].  She did well but had to be admitted urgently due to SOB.  She had already had CC with Dr. Ubaldo Glassing and was awaiting surgery scheduling.  She is on plavix for 3 months.  Also lisinopril hct changed to lisinopril and lasix for one month. 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  She needs to see Endocrinology and wants to be seen locally.  CT 08/14/2018: UNEXPECTED FINDING: Mass measuring 4.3 cm possibly arising from the right adrenal gland is indeterminate. Recommend dedicated adrenal protocol CT or MRI for further evaluation.   Lab Results  Component Value Date   HGBA1C 5.9 (H) 04/29/2018     Review of Systems  Constitutional: Negative for appetite change, fatigue, fever and unexpected weight change.  HENT: Negative for tinnitus and trouble swallowing.   Eyes: Negative for visual disturbance.  Respiratory: Negative for cough, chest tightness and  shortness of breath.   Cardiovascular: Negative for chest pain, palpitations and leg swelling.  Gastrointestinal: Negative for abdominal pain.  Endocrine: Negative for polydipsia and polyuria.  Genitourinary: Negative for dysuria and hematuria.  Musculoskeletal: Positive for arthralgias (left shoulder and chest wall pain since surgery).  Neurological: Negative for tremors, numbness and headaches.  Psychiatric/Behavioral: Negative for dysphoric mood.    Patient Active Problem List   Diagnosis Date Noted   Obesity (BMI 30-39.9) 07/09/2018   Tingling of left upper extremity 04/29/2018   Aortic stenosis, moderate 04/05/2017   Post-menopausal bleeding 04/13/2016   Hearing decreased 12/13/2015   Leukocytosis 09/15/2015   Absolute anemia 09/15/2015   Hyperlipidemia associated with type 2 diabetes mellitus ([place]) 09/14/2015   History of breast cancer in female 04/07/2015   Acquired hypothyroidism 10/04/2014   Essential (primary) hypertension 10/04/2014   Arthritis of knee, degenerative 10/04/2014   Uncontrolled type 2 diabetes mellitus with diabetic polyneuropathy, without long-term current use of insulin ([place]) 10/04/2014    Allergies  Allergen Reactions   Shellfish Allergy Hives and Itching    Past Surgical History:  Procedure Laterality Date   CESAREAN SECTION     CHOLECYSTECTOMY  2005   ECTOPIC PREGNANCY SURGERY     MASTECTOMY Right 2010   REDUCTION MAMMAPLASTY Left 2011   RIGHT/LEFT HEART CATH AND CORONARY ANGIOGRAPHY Bilateral 08/06/2018   Procedure: RIGHT/LEFT HEART CATH AND CORONARY ANGIOGRAPHY;  Surgeon: Teodoro Spray, MD;  Location: [facility];  Service: Cardiovascular;  Laterality: Bilateral;   TRANSCATHETER AORTIC VALVE REPLACEMENT, TRANSFEMORAL  07/2018    Social History   Tobacco Use   Smoking status:  Former Smoker    Years: 40.00    Types: Cigarettes    Last attempt to quit: 06/18/2012    Years since quitting: 6.2   Smokeless  tobacco: Never Used  Substance Use Topics   Alcohol use: No    Alcohol/week: 0.0 standard drinks   Drug use: No     Medication list has been reviewed and updated.  Current Meds  Medication Sig   aspirin EC 81 MG tablet Take 81 mg by mouth daily.   atorvastatin (LIPITOR) 10 MG tablet TAKE 1 TABLET(10 MG) BY MOUTH DAILY AT 6 PM (Patient taking differently: Take 10 mg by mouth daily. )   glucose blood (ONE TOUCH ULTRA TEST) test strip Use to test BS twice a day   Homeopathic Products (LEG CRAMPS PM SL) Place 3 tablets under the tongue at bedtime as needed (leg cramps).   ibuprofen (ADVIL,MOTRIN) 200 MG tablet Take 600 mg by mouth every 6 (six) hours as needed for headache or moderate pain.   levothyroxine (SYNTHROID, LEVOTHROID) 125 MCG tablet TAKE 2 TABLETS(250 MCG) BY MOUTH DAILY (Patient taking differently: Take 125 mcg by mouth daily before breakfast. )   lisinopril (PRINIVIL,ZESTRIL) 10 MG tablet Take 10 mg by mouth daily.   metFORMIN (GLUCOPHAGE-XR) 500 MG 24 hr tablet TAKE 2 TABLETS BY MOUTH TWICE DAILY (Patient taking differently: Patient taking 1 tab twice daily)   Multiple Vitamin (MULTIVITAMIN WITH MINERALS) TABS tablet Take 1 tablet by mouth daily.   ONETOUCH DELICA LANCETS 44H MISC 1 each by Other route 2 (two) times daily.   Polyvinyl Alcohol-Povidone (REFRESH OP) Place 1 drop into both eyes daily as needed (dry eyes).   pregabalin (LYRICA) 50 MG capsule Take 1 capsule (50 mg total) by mouth 3 (three) times daily.   traMADol (ULTRAM) 50 MG tablet Take 100 mg by mouth 3 (three) times daily.   [DISCONTINUED] naproxen (NAPROSYN) 500 MG tablet Take 500 mg by mouth daily.   [DISCONTINUED] Probiotic CAPS Take 1 capsule by mouth daily.    PHQ 2/9 Scores 04/29/2018 12/25/2017 09/21/2016 08/08/2015  PHQ - 2 Score 0 0 0 0    Physical Exam Vitals signs and nursing note reviewed.  Constitutional:      General: She is not in acute distress.    Appearance: She is  well-developed.  HENT:     Head: Normocephalic and atraumatic.  Neck:     Musculoskeletal: Normal range of motion and neck supple.  Cardiovascular:     Rate and Rhythm: Normal rate and regular rhythm.     Pulses: Normal pulses.  Pulmonary:     Effort: Pulmonary effort is normal. No respiratory distress.     Breath sounds: Normal breath sounds. No wheezing or rales.  Chest:    Musculoskeletal: Normal range of motion.     Left shoulder: She exhibits normal range of motion, no tenderness and no bony tenderness.     Left elbow: Normal.  Lymphadenopathy:     Cervical: No cervical adenopathy.  Skin:    General: Skin is warm and dry.     Findings: No rash.  Neurological:     Mental Status: She is alert and oriented to person, place, and time.  Psychiatric:        Attention and Perception: Attention normal.        Behavior: Behavior normal.        Thought Content: Thought content normal.     Wt Readings from Last 3 Encounters:  09/02/18 255 lb (  115.7 kg)  08/06/18 250 lb (113.4 kg)  04/29/18 246 lb (111.6 kg)    BP 112/78    Pulse 75    Resp 16    Ht 5\' 8"  (1.727 m)    Wt 255 lb (115.7 kg)    SpO2 98%    BMI 38.77 kg/m   Assessment and Plan: 1. Essential (primary) hypertension Controlled; may resume lisinopril/hctz in 2 weeks - CBC with Differential/Platelet  2. Uncontrolled type 2 diabetes mellitus with diabetic polyneuropathy, without long-term current use of insulin (HCC) Much improved BS at home Continue metformin bid - Hemoglobin A1c  3. Right adrenal mass [facility]) Referred to endocrinology - Ambulatory referral to Endocrinology  4. S/P TAVR (transcatheter aortic valve replacement) Doing well Take Plavix daily for 3 months total course   Partially dictated using Editor, commissioning. Any errors are unintentional.  Halina Maidens, MD [facility]  09/02/2018

## 2018-09-02 NOTE — Patient Instructions (Signed)
Once you complete one month of lisinopril and furosemide - you can go back to lisinopril/hctz  Take clopidogrel daily for three months, then stop.

## 2018-09-03 ENCOUNTER — Other Ambulatory Visit: Payer: Self-pay

## 2018-09-03 ENCOUNTER — Telehealth: Payer: Self-pay

## 2018-09-03 ENCOUNTER — Other Ambulatory Visit: Payer: Self-pay | Admitting: Internal Medicine

## 2018-09-03 DIAGNOSIS — M170 Bilateral primary osteoarthritis of knee: Secondary | ICD-10-CM

## 2018-09-03 LAB — CBC WITH DIFFERENTIAL/PLATELET
Basophils Absolute: 0.1 10*3/uL (ref 0.0–0.2)
Basos: 1 %
EOS (ABSOLUTE): 0.3 10*3/uL (ref 0.0–0.4)
Eos: 3 %
Hematocrit: 33.2 % — ABNORMAL LOW (ref 34.0–46.6)
Hemoglobin: 10.9 g/dL — ABNORMAL LOW (ref 11.1–15.9)
IMMATURE GRANULOCYTES: 0 %
Immature Grans (Abs): 0 10*3/uL (ref 0.0–0.1)
Lymphocytes Absolute: 2 10*3/uL (ref 0.7–3.1)
Lymphs: 21 %
MCH: 27.3 pg (ref 26.6–33.0)
MCHC: 32.8 g/dL (ref 31.5–35.7)
MCV: 83 fL (ref 79–97)
Monocytes Absolute: 0.6 10*3/uL (ref 0.1–0.9)
Monocytes: 6 %
NEUTROS PCT: 69 %
Neutrophils Absolute: 6.4 10*3/uL (ref 1.4–7.0)
Platelets: 274 10*3/uL (ref 150–450)
RBC: 3.99 x10E6/uL (ref 3.77–5.28)
RDW: 14.7 % (ref 11.7–15.4)
WBC: 9.3 10*3/uL (ref 3.4–10.8)

## 2018-09-03 LAB — HEMOGLOBIN A1C
Est. average glucose Bld gHb Est-mCnc: 117 mg/dL
Hgb A1c MFr Bld: 5.7 % — ABNORMAL HIGH (ref 4.8–5.6)

## 2018-09-03 MED ORDER — ATORVASTATIN CALCIUM 10 MG PO TABS: 10.0000 mg | ORAL_TABLET | Freq: Every day | ORAL | 1 refills | Status: DC

## 2018-09-03 MED ORDER — TRAMADOL HCL 50 MG PO TABS: 100.0000 mg | ORAL_TABLET | Freq: Three times a day (TID) | ORAL | 3 refills | Status: DC | PRN

## 2018-09-03 NOTE — Telephone Encounter (Signed)
Patient calling leaving VM stating she was just seem yesterday and she was asked at her visit if she needed refills. She didn't realize until she got home that she did need refills on tramadol and atorvastatin. Sent in a atorvastatin. Just needs tramadol sent to [facility]   Please Advise.

## 2018-09-29 DIAGNOSIS — Z952 Presence of prosthetic heart valve: Secondary | ICD-10-CM | POA: Diagnosis not present

## 2018-10-10 DIAGNOSIS — E278 Other specified disorders of adrenal gland: Secondary | ICD-10-CM | POA: Diagnosis not present

## 2018-10-14 DIAGNOSIS — E119 Type 2 diabetes mellitus without complications: Secondary | ICD-10-CM | POA: Diagnosis not present

## 2018-10-14 DIAGNOSIS — I350 Nonrheumatic aortic (valve) stenosis: Secondary | ICD-10-CM | POA: Diagnosis not present

## 2018-10-14 DIAGNOSIS — I5023 Acute on chronic systolic (congestive) heart failure: Secondary | ICD-10-CM | POA: Diagnosis not present

## 2018-10-14 DIAGNOSIS — E278 Other specified disorders of adrenal gland: Secondary | ICD-10-CM | POA: Diagnosis not present

## 2018-10-14 DIAGNOSIS — I10 Essential (primary) hypertension: Secondary | ICD-10-CM | POA: Diagnosis not present

## 2018-10-14 DIAGNOSIS — E782 Mixed hyperlipidemia: Secondary | ICD-10-CM | POA: Diagnosis not present

## 2018-10-21 ENCOUNTER — Other Ambulatory Visit (HOSPITAL_COMMUNITY): Payer: Self-pay | Admitting: "Endocrinology

## 2018-10-21 ENCOUNTER — Other Ambulatory Visit: Payer: Self-pay | Admitting: "Endocrinology

## 2018-10-21 DIAGNOSIS — E278 Other specified disorders of adrenal gland: Secondary | ICD-10-CM

## 2018-10-21 DIAGNOSIS — E279 Disorder of adrenal gland, unspecified: Principal | ICD-10-CM

## 2018-11-06 ENCOUNTER — Other Ambulatory Visit: Payer: Self-pay

## 2018-11-06 NOTE — Patient Outreach (Signed)
[facility]) Care Management  11/06/2018  Kendra Walton [birth year] [id number]   Medication Adherence call to Mrs. Kendra Walton [facility] Identifiers Verify spoke with patient she is due on Metformin Er 500 mg patient explain she is taking 2 tablet 2 times daily patient was in the hospital for 2 weeks and they provide with all of her medications patient said she has plenty at this time and does not need any . [name] is showing past due under [place].   [place] Management Direct Dial [phone]  Fax [phone] [email or web address]

## 2018-12-15 ENCOUNTER — Other Ambulatory Visit: Payer: Self-pay | Admitting: Internal Medicine

## 2018-12-15 DIAGNOSIS — E1142 Type 2 diabetes mellitus with diabetic polyneuropathy: Secondary | ICD-10-CM

## 2018-12-18 ENCOUNTER — Telehealth: Payer: Self-pay

## 2018-12-18 ENCOUNTER — Other Ambulatory Visit: Payer: Self-pay

## 2018-12-18 DIAGNOSIS — E1142 Type 2 diabetes mellitus with diabetic polyneuropathy: Secondary | ICD-10-CM

## 2018-12-18 MED ORDER — PREGABALIN 50 MG PO CAPS: ORAL_CAPSULE | ORAL | 1 refills | Status: DC

## 2018-12-18 NOTE — Telephone Encounter (Signed)
Patient called saying she needed new Rx Printed and Paxed to [facility]. They do not take E Scripts. Only faxed prescriptions with the Patients Id, Our Letter Head, The patients name, DOB, and Address.   Faxed Rx to [facility] at [phone] and spoke with patient to inform.

## 2018-12-30 ENCOUNTER — Other Ambulatory Visit: Payer: Self-pay

## 2018-12-30 ENCOUNTER — Encounter: Payer: Self-pay | Admitting: Internal Medicine

## 2018-12-30 ENCOUNTER — Ambulatory Visit (INDEPENDENT_AMBULATORY_CARE_PROVIDER_SITE_OTHER): Payer: Medicare Other | Admitting: Internal Medicine

## 2018-12-30 VITALS — BP 118/68 | HR 88 | Ht 68.0 in | Wt 251.0 lb

## 2018-12-30 DIAGNOSIS — Z1231 Encounter for screening mammogram for malignant neoplasm of breast: Secondary | ICD-10-CM | POA: Diagnosis not present

## 2018-12-30 DIAGNOSIS — E1169 Type 2 diabetes mellitus with other specified complication: Secondary | ICD-10-CM | POA: Diagnosis not present

## 2018-12-30 DIAGNOSIS — E785 Hyperlipidemia, unspecified: Secondary | ICD-10-CM

## 2018-12-30 DIAGNOSIS — E118 Type 2 diabetes mellitus with unspecified complications: Secondary | ICD-10-CM | POA: Diagnosis not present

## 2018-12-30 DIAGNOSIS — I10 Essential (primary) hypertension: Secondary | ICD-10-CM

## 2018-12-30 DIAGNOSIS — M79672 Pain in left foot: Secondary | ICD-10-CM

## 2018-12-30 DIAGNOSIS — Z0000 Encounter for general adult medical examination without abnormal findings: Secondary | ICD-10-CM

## 2018-12-30 DIAGNOSIS — E039 Hypothyroidism, unspecified: Secondary | ICD-10-CM

## 2018-12-30 DIAGNOSIS — M79671 Pain in right foot: Secondary | ICD-10-CM

## 2018-12-30 DIAGNOSIS — E278 Other specified disorders of adrenal gland: Secondary | ICD-10-CM

## 2018-12-30 LAB — POCT URINALYSIS DIPSTICK
Bilirubin, UA: NEGATIVE
Blood, UA: NEGATIVE
Glucose, UA: NEGATIVE
Ketones, UA: NEGATIVE
Leukocytes, UA: NEGATIVE
Nitrite, UA: NEGATIVE
Protein, UA: NEGATIVE
Spec Grav, UA: 1.02 (ref 1.010–1.025)
Urobilinogen, UA: 0.2 E.U./dL
pH, UA: 5 (ref 5.0–8.0)

## 2018-12-30 NOTE — Progress Notes (Signed)
Date:  12/30/2018   Name:  Kendra Walton   DOB:  Dec 27, 1950   MRN:  [id number]   Chief Complaint: Annual Exam (Breast Exam. ) Kendra Walton is a 68 y.o. female who presents today for her Complete Annual Exam. She feels fairly well. She reports exercising rarely but plans to start. She reports she is sleeping well. She denies breast complaints - has been released from Oncology follow up.  Mammogram - 09/2017 Colonoscopy - declines DEXA - declines Immunizations up to date  Thyroid Problem Presents for follow-up visit. Patient reports no anxiety, constipation, diarrhea, fatigue, palpitations or tremors. Her past medical history is significant for hyperlipidemia.  Hyperlipidemia This is a chronic problem. The problem is controlled. Pertinent negatives include no chest pain or shortness of breath.  Hypertension Pertinent negatives include no chest pain, headaches, palpitations or shortness of breath. Identifiable causes of hypertension include a thyroid problem.  Diabetes She presents for her follow-up diabetic visit. She has type 2 diabetes mellitus. Her disease course has been stable. Pertinent negatives for hypoglycemia include no dizziness, headaches, nervousness/anxiousness or tremors. Pertinent negatives for diabetes include no chest pain, no fatigue, no polydipsia and no polyuria. Current diabetic treatment includes oral agent (triple therapy). She is compliant with treatment all of the time. She is following a generally healthy diet. She monitors blood glucose at home 1-2 x per day. Her breakfast blood glucose is taken between 6-7 am. Her breakfast blood glucose range is generally 110-130 mg/dl. An ACE inhibitor/angiotensin II receptor blocker is being taken.   Lab Results  Component Value Date   HGBA1C 5.7 (H) 09/02/2018   Lab Results  Component Value Date   CREATININE 0.93 12/25/2017   BUN 19 12/25/2017   NA 141 12/25/2017   K 5.1 12/25/2017   CL 99  12/25/2017   CO2 25 12/25/2017   Lab Results  Component Value Date   CHOL 142 12/25/2017   HDL 48 12/25/2017   LDLCALC 73 12/25/2017   TRIG 104 12/25/2017   CHOLHDL 3.0 12/25/2017   Lab Results  Component Value Date   TSH 1.370 12/25/2017     Review of Systems  Constitutional: Negative for chills, fatigue and fever.  HENT: Positive for hearing loss. Negative for congestion, tinnitus, trouble swallowing and voice change.   Eyes: Negative for visual disturbance.  Respiratory: Negative for cough, chest tightness, shortness of breath and wheezing.   Cardiovascular: Negative for chest pain, palpitations and leg swelling.  Gastrointestinal: Negative for abdominal pain, constipation, diarrhea and vomiting.  Endocrine: Negative for polydipsia and polyuria.  Genitourinary: Negative for dysuria, frequency, genital sores, vaginal bleeding and vaginal discharge.  Musculoskeletal: Positive for gait problem (pain in the arches of both feet). Negative for arthralgias (right hip pain) and joint swelling.  Skin: Negative for color change and rash.  Neurological: Positive for numbness (in feet). Negative for dizziness, tremors, light-headedness and headaches.  Hematological: Negative for adenopathy. Does not bruise/bleed easily.  Psychiatric/Behavioral: Negative for dysphoric mood and sleep disturbance. The patient is not nervous/anxious.     Patient Active Problem List   Diagnosis Date Noted  . 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 Right adrenal mass ([place]) 09/02/2018  . Obesity (BMI 30-39.9) 07/09/2018  . Tingling of left upper extremity 04/29/2018  . Aortic stenosis, moderate 04/05/2017  . Post-menopausal bleeding 04/13/2016  . Hearing decreased 12/13/2015  . Leukocytosis 09/15/2015  . Absolute anemia 09/15/2015  . Hyperlipidemia associated with type 2 diabetes mellitus (  [place]) 09/14/2015  . History of breast cancer in female 04/07/2015  . Acquired hypothyroidism  10/04/2014  . Essential (primary) hypertension 10/04/2014  . Arthritis of knee, degenerative 10/04/2014  . Uncontrolled type 2 diabetes mellitus with diabetic polyneuropathy, without long-term current use of insulin ([place]) 10/04/2014    Allergies  Allergen Reactions  . Shellfish Allergy Hives and Itching    Past Surgical History:  Procedure Laterality Date  . CESAREAN SECTION    . CHOLECYSTECTOMY  2005  . ECTOPIC PREGNANCY SURGERY    . MASTECTOMY Right 2010  . REDUCTION MAMMAPLASTY Left 2011  . RIGHT/LEFT HEART CATH AND CORONARY ANGIOGRAPHY Bilateral 08/06/2018   Procedure: RIGHT/LEFT HEART CATH AND CORONARY ANGIOGRAPHY;  Surgeon: Teodoro Spray, MD;  Location: [facility];  Service: Cardiovascular;  Laterality: Bilateral;  . TRANSCATHETER AORTIC VALVE REPLACEMENT, TRANSAPICAL  08/18/2018   [facility]    Social History   Tobacco Use  . Smoking status: Former Smoker    Years: 40.00    Types: Cigarettes    Quit date: 06/18/2012    Years since quitting: 6.5  . Smokeless tobacco: Never Used  Substance Use Topics  . Alcohol use: No    Alcohol/week: 0.0 standard drinks  . Drug use: No     Medication list has been reviewed and updated.  Current Meds  Medication Sig  . aspirin EC 81 MG tablet Take 81 mg by mouth daily.  Marland Kitchen atorvastatin (LIPITOR) 10 MG tablet Take 1 tablet (10 mg total) by mouth daily.  Marland Kitchen glucosamine-chondroitin 500-400 MG tablet Take 1 tablet by mouth.  Marland Kitchen glucose blood (ONE TOUCH ULTRA TEST) test strip Use to test BS twice a day  . Homeopathic Products (LEG CRAMPS PM SL) Place 3 tablets under the tongue at bedtime as needed (leg cramps).  Marland Kitchen ibuprofen (ADVIL,MOTRIN) 200 MG tablet Take 600 mg by mouth every 6 (six) hours as needed for headache or moderate pain.  Marland Kitchen levothyroxine (SYNTHROID, LEVOTHROID) 125 MCG tablet TAKE 2 TABLETS(250 MCG) BY MOUTH DAILY (Patient taking differently: Take 125 mcg by mouth daily before breakfast. )  . lisinopril  (PRINIVIL,ZESTRIL) 10 MG tablet Take 10 mg by mouth daily.  . metFORMIN (GLUCOPHAGE-XR) 500 MG 24 hr tablet TAKE 2 TABLETS BY MOUTH TWICE DAILY (Patient taking differently: Patient taking 1 tab twice daily)  . Multiple Vitamin (MULTIVITAMIN WITH MINERALS) TABS tablet Take 1 tablet by mouth daily.  Glory Rosebush DELICA LANCETS 47S MISC 1 each by Other route 2 (two) times daily.  . Polyvinyl Alcohol-Povidone (REFRESH OP) Place 1 drop into both eyes daily as needed (dry eyes).  . pregabalin (LYRICA) 50 MG capsule Take 1 capsule by mouth 3 times a day as directed by physician.    PHQ 2/9 Scores 12/30/2018 04/29/2018 12/25/2017 09/21/2016  PHQ - 2 Score 0 0 0 0  PHQ- 9 Score 0 - - -    BP Readings from Last 3 Encounters:  12/30/18 118/68  09/02/18 112/78  08/06/18 (!) 89/78    Physical Exam Vitals signs and nursing note reviewed.  Constitutional:      General: She is not in acute distress.    Appearance: She is well-developed.  HENT:     Head: Normocephalic and atraumatic.     Right Ear: Tympanic membrane and ear canal normal.     Left Ear: Tympanic membrane and ear canal normal.     Nose:     Right Sinus: No maxillary sinus tenderness.     Left Sinus: No  maxillary sinus tenderness.     Mouth/Throat:     Pharynx: Uvula midline.  Eyes:     General: No scleral icterus.       Right eye: No discharge.        Left eye: No discharge.     Conjunctiva/sclera: Conjunctivae normal.  Neck:     Musculoskeletal: Normal range of motion. No erythema.     Thyroid: No thyromegaly.     Vascular: No carotid bruit.  Cardiovascular:     Rate and Rhythm: Normal rate and regular rhythm.     Pulses: Normal pulses.     Heart sounds: Normal heart sounds.  Pulmonary:     Effort: Pulmonary effort is normal. No respiratory distress.     Breath sounds: No wheezing.  Chest:     Breasts:        Right: No tenderness.        Left: No mass, nipple discharge or tenderness.     Comments: Post surgical right  breast changes Abdominal:     General: Bowel sounds are normal.     Palpations: Abdomen is soft.     Tenderness: There is no abdominal tenderness.  Musculoskeletal: Normal range of motion.     Right lower leg: No edema.     Left lower leg: No edema.     Comments: Tender over arches of both feet with nodules noted on the right foot  Lymphadenopathy:     Cervical: No cervical adenopathy.     Upper Body:     Right upper body: No axillary or pectoral adenopathy.     Left upper body: No axillary or pectoral adenopathy.  Skin:    General: Skin is warm and dry.     Capillary Refill: Capillary refill takes less than 2 seconds.     Findings: No rash.  Neurological:     Mental Status: She is alert and oriented to person, place, and time.     Cranial Nerves: No cranial nerve deficit.     Sensory: No sensory deficit.     Deep Tendon Reflexes: Reflexes are normal and symmetric.  Psychiatric:        Attention and Perception: Attention normal.        Mood and Affect: Mood normal.        Speech: Speech normal.        Behavior: Behavior normal.        Thought Content: Thought content normal.     Wt Readings from Last 3 Encounters:  12/30/18 251 lb (113.9 kg)  09/02/18 255 lb (115.7 kg)  08/06/18 250 lb (113.4 kg)    BP 118/68   Pulse 88   Ht 5\' 8"  (1.727 m)   Wt 251 lb (113.9 kg)   SpO2 95%   BMI 38.16 kg/m   Assessment and Plan: 1. Annual physical exam Normal exam except for weight Begin home exercise routine - POCT urinalysis dipstick  2. Encounter for screening mammogram for breast cancer - MM 3D SCREEN BREAST BILATERAL; Future  3. Essential (primary) hypertension controlled - CBC with Differential/Platelet  4. Acquired hypothyroidism supplemented - TSH + free T4  5. Hyperlipidemia associated with type 2 diabetes mellitus ([place]) On statin therapy - Lipid panel  6. Type II diabetes mellitus with complication (HCC) controlled - Comprehensive metabolic panel -  Hemoglobin A1c  7. Right adrenal mass [facility]) Seeing Endocrinology and has adrenal scan next month  8. Pain in both feet Pt will schedule with Podatrist  Partially dictated using Editor, commissioning. Any errors are unintentional.  Halina Maidens, MD [facility]  12/30/2018

## 2018-12-31 LAB — LIPID PANEL
Chol/HDL Ratio: 3.9 ratio (ref 0.0–4.4)
Cholesterol, Total: 172 mg/dL (ref 100–199)
HDL: 44 mg/dL (ref >39)
LDL Calculated: 88 mg/dL (ref 0–99)
Triglycerides: 200 mg/dL — ABNORMAL HIGH (ref 0–149)
VLDL Cholesterol Cal: 40 mg/dL (ref 5–40)

## 2018-12-31 LAB — COMPREHENSIVE METABOLIC PANEL
ALT: 26 IU/L (ref 0–32)
AST: 18 IU/L (ref 0–40)
Albumin/Globulin Ratio: 1.7 (ref 1.2–2.2)
Albumin: 4.5 g/dL (ref 3.8–4.8)
Alkaline Phosphatase: 68 IU/L (ref 39–117)
BUN/Creatinine Ratio: 25 (ref 12–28)
BUN: 27 mg/dL (ref 8–27)
Bilirubin Total: 0.3 mg/dL (ref 0.0–1.2)
CO2: 25 mmol/L (ref 20–29)
Calcium: 9.7 mg/dL (ref 8.7–10.3)
Chloride: 98 mmol/L (ref 96–106)
Creatinine, Ser: 1.1 mg/dL — ABNORMAL HIGH (ref 0.57–1.00)
GFR calc Af Amer: 59 mL/min/{1.73_m2} — ABNORMAL LOW (ref >59)
GFR calc non Af Amer: 51 mL/min/{1.73_m2} — ABNORMAL LOW (ref >59)
Globulin, Total: 2.7 g/dL (ref 1.5–4.5)
Glucose: 97 mg/dL (ref 65–99)
Potassium: 5.4 mmol/L — ABNORMAL HIGH (ref 3.5–5.2)
Sodium: 140 mmol/L (ref 134–144)
Total Protein: 7.2 g/dL (ref 6.0–8.5)

## 2018-12-31 LAB — CBC WITH DIFFERENTIAL/PLATELET
Basophils Absolute: 0.1 10*3/uL (ref 0.0–0.2)
Basos: 1 %
EOS (ABSOLUTE): 0.3 10*3/uL (ref 0.0–0.4)
Eos: 3 %
Hematocrit: 32.8 % — ABNORMAL LOW (ref 34.0–46.6)
Hemoglobin: 10.9 g/dL — ABNORMAL LOW (ref 11.1–15.9)
Immature Grans (Abs): 0 10*3/uL (ref 0.0–0.1)
Immature Granulocytes: 0 %
Lymphocytes Absolute: 3 10*3/uL (ref 0.7–3.1)
Lymphs: 27 %
MCH: 28.5 pg (ref 26.6–33.0)
MCHC: 33.2 g/dL (ref 31.5–35.7)
MCV: 86 fL (ref 79–97)
Monocytes Absolute: 0.8 10*3/uL (ref 0.1–0.9)
Monocytes: 7 %
Neutrophils Absolute: 6.9 10*3/uL (ref 1.4–7.0)
Neutrophils: 62 %
Platelets: 285 10*3/uL (ref 150–450)
RBC: 3.82 x10E6/uL (ref 3.77–5.28)
RDW: 12.6 % (ref 11.7–15.4)
WBC: 11.1 10*3/uL — ABNORMAL HIGH (ref 3.4–10.8)

## 2018-12-31 LAB — HEMOGLOBIN A1C
Est. average glucose Bld gHb Est-mCnc: 128 mg/dL
Hgb A1c MFr Bld: 6.1 % — ABNORMAL HIGH (ref 4.8–5.6)

## 2018-12-31 LAB — TSH+FREE T4
Free T4: 1.3 ng/dL (ref 0.82–1.77)
TSH: 4.58 u[IU]/mL — ABNORMAL HIGH (ref 0.450–4.500)

## 2019-01-05 ENCOUNTER — Other Ambulatory Visit: Payer: Self-pay | Admitting: Internal Medicine

## 2019-01-22 ENCOUNTER — Other Ambulatory Visit: Payer: Self-pay | Admitting: Internal Medicine

## 2019-01-22 DIAGNOSIS — M170 Bilateral primary osteoarthritis of knee: Secondary | ICD-10-CM

## 2019-01-23 NOTE — Telephone Encounter (Signed)
**   CALLED IN RX for patient. Tramadol 50 mg- every 8 hours as needed. #180 with 0 RFS.

## 2019-02-06 ENCOUNTER — Other Ambulatory Visit: Payer: Self-pay

## 2019-02-06 ENCOUNTER — Ambulatory Visit
Admission: RE | Admit: 2019-02-06 | Discharge: 2019-02-06 | Disposition: A | Discharge status: Home / Self Care | Payer: Medicare Other | Source: Ambulatory Visit | Attending: "Endocrinology | Admitting: "Endocrinology

## 2019-02-06 ENCOUNTER — Encounter (INDEPENDENT_AMBULATORY_CARE_PROVIDER_SITE_OTHER): Payer: Self-pay

## 2019-02-06 DIAGNOSIS — I700 Atherosclerosis of aorta: Secondary | ICD-10-CM | POA: Insufficient documentation

## 2019-02-06 DIAGNOSIS — Z9049 Acquired absence of other specified parts of digestive tract: Secondary | ICD-10-CM | POA: Insufficient documentation

## 2019-02-06 DIAGNOSIS — E278 Other specified disorders of adrenal gland: Secondary | ICD-10-CM

## 2019-02-06 DIAGNOSIS — E119 Type 2 diabetes mellitus without complications: Secondary | ICD-10-CM | POA: Diagnosis not present

## 2019-02-06 DIAGNOSIS — R918 Other nonspecific abnormal finding of lung field: Secondary | ICD-10-CM | POA: Diagnosis not present

## 2019-02-06 DIAGNOSIS — E279 Disorder of adrenal gland, unspecified: Secondary | ICD-10-CM | POA: Diagnosis not present

## 2019-02-06 DIAGNOSIS — K760 Fatty (change of) liver, not elsewhere classified: Secondary | ICD-10-CM | POA: Diagnosis not present

## 2019-02-06 HISTORY — DX: Essential (primary) hypertension: I10

## 2019-02-06 HISTORY — DX: Other specified disorders of adrenal gland: E27.8

## 2019-02-06 MED ORDER — IOHEXOL 300 MG/ML  SOLN
80.0000 mL | Freq: Once | INTRAMUSCULAR | Status: AC | PRN
  Administered 2019-02-06: 80 mL via INTRAVENOUS

## 2019-02-09 ENCOUNTER — Other Ambulatory Visit: Payer: Self-pay

## 2019-02-09 ENCOUNTER — Other Ambulatory Visit: Payer: Self-pay | Admitting: "Endocrinology

## 2019-02-09 ENCOUNTER — Ambulatory Visit
Admission: RE | Admit: 2019-02-09 | Discharge: 2019-02-09 | Disposition: A | Discharge status: Home / Self Care | Payer: Medicare Other | Source: Ambulatory Visit | Attending: "Endocrinology | Admitting: "Endocrinology

## 2019-02-09 ENCOUNTER — Other Ambulatory Visit
Admission: RE | Admit: 2019-02-09 | Discharge: 2019-02-09 | Disposition: A | Discharge status: Home / Self Care | Payer: Medicare Other | Source: Home / Self Care | Attending: "Endocrinology | Admitting: "Endocrinology

## 2019-02-09 DIAGNOSIS — E279 Disorder of adrenal gland, unspecified: Secondary | ICD-10-CM | POA: Diagnosis not present

## 2019-02-09 DIAGNOSIS — E278 Other specified disorders of adrenal gland: Secondary | ICD-10-CM | POA: Insufficient documentation

## 2019-02-09 LAB — CREATININE, SERUM
Creatinine, Ser: 1.31 mg/dL — ABNORMAL HIGH (ref 0.44–1.00)
GFR calc Af Amer: 48 mL/min — ABNORMAL LOW (ref >60)
GFR calc non Af Amer: 41 mL/min — ABNORMAL LOW (ref >60)

## 2019-02-09 MED ORDER — IOPAMIDOL (ISOVUE-300) INJECTION 61%
50.0000 mL | Freq: Once | INTRAVENOUS | Status: AC | PRN
  Administered 2019-02-09: 50 mL via INTRAVENOUS

## 2019-02-12 DIAGNOSIS — Z952 Presence of prosthetic heart valve: Secondary | ICD-10-CM | POA: Diagnosis not present

## 2019-02-12 DIAGNOSIS — I350 Nonrheumatic aortic (valve) stenosis: Secondary | ICD-10-CM | POA: Diagnosis not present

## 2019-02-12 DIAGNOSIS — E782 Mixed hyperlipidemia: Secondary | ICD-10-CM | POA: Diagnosis not present

## 2019-02-12 DIAGNOSIS — I10 Essential (primary) hypertension: Secondary | ICD-10-CM | POA: Diagnosis not present

## 2019-02-12 DIAGNOSIS — I5023 Acute on chronic systolic (congestive) heart failure: Secondary | ICD-10-CM | POA: Diagnosis not present

## 2019-02-19 ENCOUNTER — Other Ambulatory Visit: Payer: Self-pay | Admitting: Internal Medicine

## 2019-02-19 DIAGNOSIS — M170 Bilateral primary osteoarthritis of knee: Secondary | ICD-10-CM

## 2019-03-05 ENCOUNTER — Other Ambulatory Visit: Payer: Self-pay | Admitting: Internal Medicine

## 2019-03-16 DIAGNOSIS — M79672 Pain in left foot: Secondary | ICD-10-CM | POA: Diagnosis not present

## 2019-03-16 DIAGNOSIS — M7752 Other enthesopathy of left foot: Secondary | ICD-10-CM | POA: Diagnosis not present

## 2019-03-19 ENCOUNTER — Telehealth: Payer: Self-pay

## 2019-03-19 ENCOUNTER — Other Ambulatory Visit: Payer: Self-pay | Admitting: Internal Medicine

## 2019-03-19 DIAGNOSIS — M170 Bilateral primary osteoarthritis of knee: Secondary | ICD-10-CM

## 2019-03-19 MED ORDER — LISINOPRIL 10 MG PO TABS: 10.0000 mg | ORAL_TABLET | Freq: Every day | ORAL | 1 refills | Status: DC

## 2019-03-19 MED ORDER — HYDROCHLOROTHIAZIDE 12.5 MG PO TABS: 12.5000 mg | ORAL_TABLET | Freq: Every day | ORAL | 1 refills | Status: DC

## 2019-03-19 NOTE — Telephone Encounter (Signed)
Patient called saying she needs a RF on levothyroxine but pharmacy said picked up a 90 day supply on 03/09/19.   Also, patient said her lisinopril HCTZ is on back order. Sent in tablets separate for patient. Called and informed pt of this.

## 2019-03-27 ENCOUNTER — Other Ambulatory Visit: Payer: Self-pay | Admitting: Internal Medicine

## 2019-03-27 DIAGNOSIS — E1142 Type 2 diabetes mellitus with diabetic polyneuropathy: Secondary | ICD-10-CM

## 2019-03-27 MED ORDER — PREGABALIN 50 MG PO CAPS: 50.0000 mg | ORAL_CAPSULE | Freq: Three times a day (TID) | ORAL | 1 refills | Status: DC

## 2019-03-28 ENCOUNTER — Other Ambulatory Visit: Payer: Self-pay | Admitting: Internal Medicine

## 2019-03-30 ENCOUNTER — Telehealth: Payer: Self-pay

## 2019-03-30 NOTE — Telephone Encounter (Signed)
Called and informed patient we faxed all of her [place] paperwork with the Rx to [facility] this morning with a confirmation fax.  She verbalized understanding.

## 2019-04-06 DIAGNOSIS — M79672 Pain in left foot: Secondary | ICD-10-CM | POA: Diagnosis not present

## 2019-04-06 DIAGNOSIS — M7752 Other enthesopathy of left foot: Secondary | ICD-10-CM | POA: Diagnosis not present

## 2019-04-06 DIAGNOSIS — E1142 Type 2 diabetes mellitus with diabetic polyneuropathy: Secondary | ICD-10-CM | POA: Diagnosis not present

## 2019-04-14 ENCOUNTER — Telehealth: Payer: Self-pay | Admitting: Internal Medicine

## 2019-04-14 NOTE — Telephone Encounter (Signed)
declined

## 2019-04-16 ENCOUNTER — Other Ambulatory Visit: Payer: Self-pay | Admitting: Internal Medicine

## 2019-05-07 ENCOUNTER — Encounter: Payer: Self-pay | Admitting: Internal Medicine

## 2019-05-07 ENCOUNTER — Other Ambulatory Visit: Payer: Self-pay

## 2019-05-07 ENCOUNTER — Ambulatory Visit (INDEPENDENT_AMBULATORY_CARE_PROVIDER_SITE_OTHER): Payer: Medicare Other | Admitting: Internal Medicine

## 2019-05-07 VITALS — BP 102/68 | HR 95 | Ht 68.0 in | Wt 244.0 lb

## 2019-05-07 DIAGNOSIS — E1169 Type 2 diabetes mellitus with other specified complication: Secondary | ICD-10-CM | POA: Diagnosis not present

## 2019-05-07 DIAGNOSIS — E118 Type 2 diabetes mellitus with unspecified complications: Secondary | ICD-10-CM

## 2019-05-07 DIAGNOSIS — E1142 Type 2 diabetes mellitus with diabetic polyneuropathy: Secondary | ICD-10-CM

## 2019-05-07 DIAGNOSIS — I10 Essential (primary) hypertension: Secondary | ICD-10-CM

## 2019-05-07 DIAGNOSIS — Z23 Encounter for immunization: Secondary | ICD-10-CM

## 2019-05-07 DIAGNOSIS — E785 Hyperlipidemia, unspecified: Secondary | ICD-10-CM

## 2019-05-07 MED ORDER — AZO BLADDER CONTROL/GO-LESS PO CAPS: 1.0000 | ORAL_CAPSULE | Freq: Two times a day (BID) | ORAL | 0 refills | Status: DC

## 2019-05-07 MED ORDER — PREGABALIN 50 MG PO CAPS: 50.0000 mg | ORAL_CAPSULE | Freq: Four times a day (QID) | ORAL | 1 refills | Status: DC

## 2019-05-07 NOTE — Progress Notes (Signed)
Date:  05/07/2019   Name:  Kendra Walton   DOB:  13-Jan-1951   MRN:  [id number]   Chief Complaint: Diabetes (4 month follow up. High flu shot. ) and Hypertension  Diabetes She presents for her follow-up diabetic visit. She has type 2 diabetes mellitus. Her disease course has been stable. Pertinent negatives for hypoglycemia include no dizziness or headaches. Associated symptoms include foot paresthesias (helped by Lyrica but would like to increase the dose to help with sleep). Pertinent negatives for diabetes include no chest pain and no fatigue. Symptoms are stable. Current diabetic treatment includes oral agent (monotherapy) (metformin 500 mg bid). Her weight is stable. She is following a generally healthy diet. She participates in exercise intermittently. She monitors blood glucose at home 1-2 x per day. Her breakfast blood glucose is taken between 6-7 am. Her breakfast blood glucose range is generally 130-140 mg/dl. An ACE inhibitor/angiotensin II receptor blocker is being taken. Eye exam is not current (Eye exam due).  Hypertension The problem is controlled. Pertinent negatives include no chest pain, headaches, palpitations or shortness of breath. Past treatments include ACE inhibitors and diuretics. The current treatment provides significant improvement.  Hyperlipidemia The problem is controlled. Pertinent negatives include no chest pain or shortness of breath. Current antihyperlipidemic treatment includes statins. The current treatment provides significant improvement of lipids.    Lab Results  Component Value Date   CREATININE 1.31 (H) 02/09/2019   BUN 27 12/30/2018   NA 140 12/30/2018   K 5.4 (H) 12/30/2018   CL 98 12/30/2018   CO2 25 12/30/2018   Lab Results  Component Value Date   CHOL 172 12/30/2018   HDL 44 12/30/2018   LDLCALC 88 12/30/2018   TRIG 200 (H) 12/30/2018   CHOLHDL 3.9 12/30/2018   Lab Results  Component Value Date   TSH 4.580 (H) 12/30/2018   Lab Results  Component Value Date   HGBA1C 6.1 (H) 12/30/2018     Review of Systems  Constitutional: Negative for chills, fatigue, fever and unexpected weight change.  Respiratory: Negative for cough, chest tightness, shortness of breath and wheezing.   Cardiovascular: Negative for chest pain, palpitations and leg swelling.  Gastrointestinal: Negative for abdominal pain, constipation and diarrhea.  Genitourinary: Positive for urgency (and leakage at time). Negative for difficulty urinating.       Some OAB sx and leakage - using over the counter AZO bladder control but would like a prescription  Neurological: Positive for numbness (and DM foot pain). Negative for dizziness and headaches.  Psychiatric/Behavioral: Negative for dysphoric mood and sleep disturbance.    Patient Active Problem List   Diagnosis Date Noted  . 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 Right adrenal mass ([place]) 09/02/2018  . Obesity (BMI 30-39.9) 07/09/2018  . Tingling of left upper extremity 04/29/2018  . Aortic stenosis, moderate 04/05/2017  . Post-menopausal bleeding 04/13/2016  . Hearing decreased 12/13/2015  . Leukocytosis 09/15/2015  . Absolute anemia 09/15/2015  . Hyperlipidemia associated with type 2 diabetes mellitus ([place]) 09/14/2015  . History of breast cancer in female 04/07/2015  . Acquired hypothyroidism 10/04/2014  . Essential (primary) hypertension 10/04/2014  . Arthritis of knee, degenerative 10/04/2014  . Type II diabetes mellitus with complication ([place]) [id number]    Allergies  Allergen Reactions  . Shellfish Allergy Hives and Itching    Past Surgical History:  Procedure Laterality Date  . CESAREAN SECTION    . CHOLECYSTECTOMY  2005  . ECTOPIC PREGNANCY SURGERY    .  MASTECTOMY Right 2010  . REDUCTION MAMMAPLASTY Left 2011  . RIGHT/LEFT HEART CATH AND CORONARY ANGIOGRAPHY Bilateral 08/06/2018   Procedure: RIGHT/LEFT HEART CATH AND CORONARY ANGIOGRAPHY;   Surgeon: Teodoro Spray, MD;  Location: [facility];  Service: Cardiovascular;  Laterality: Bilateral;  . TRANSCATHETER AORTIC VALVE REPLACEMENT, TRANSAPICAL  08/18/2018   [facility]    Social History   Tobacco Use  . Smoking status: Former Smoker    Years: 40.00    Types: Cigarettes    Quit date: 06/18/2012    Years since quitting: 6.8  . Smokeless tobacco: Never Used  Substance Use Topics  . Alcohol use: No    Alcohol/week: 0.0 standard drinks  . Drug use: No     Medication list has been reviewed and updated.  Current Meds  Medication Sig  . aspirin EC 81 MG tablet Take 81 mg by mouth daily.  Marland Kitchen atorvastatin (LIPITOR) 10 MG tablet TAKE 1 TABLET(10 MG) BY MOUTH DAILY  . glucose blood (ONE TOUCH ULTRA TEST) test strip Use to test BS twice a day  . hydrochlorothiazide (HYDRODIURIL) 12.5 MG tablet Take 1 tablet (12.5 mg total) by mouth daily.  Marland Kitchen ibuprofen (ADVIL,MOTRIN) 200 MG tablet Take 600 mg by mouth every 6 (six) hours as needed for headache or moderate pain.  Marland Kitchen levothyroxine (SYNTHROID) 125 MCG tablet TAKE 2 TABLETS(250 MCG) BY MOUTH DAILY (Patient taking differently: Take 125 mcg by mouth daily before breakfast. )  . lisinopril-hydrochlorothiazide (ZESTORETIC) 10-12.5 MG tablet Take 1 tablet by mouth daily.  . metFORMIN (GLUCOPHAGE-XR) 500 MG 24 hr tablet TAKE 2 TABLETS BY MOUTH TWICE DAILY (Patient taking differently: Take 500 mg by mouth daily with breakfast. Patient taking 1 tab twice daily)  . Multiple Vitamin (MULTIVITAMIN WITH MINERALS) TABS tablet Take 1 tablet by mouth daily.  Glory Rosebush DELICA LANCETS 99991111 MISC 1 each by Other route 2 (two) times daily.  . Polyvinyl Alcohol-Povidone (REFRESH OP) Place 1 drop into both eyes daily as needed (dry eyes).  . pregabalin (LYRICA) 50 MG capsule Take 1 capsule (50 mg total) by mouth every 6 (six) hours.  . traMADol (ULTRAM) 50 MG tablet TAKE 2 TABLETS BY MOUTH EVERY 8 HOURS AS NEEDED  . [DISCONTINUED] pregabalin (LYRICA) 50  MG capsule Take 1 capsule (50 mg total) by mouth 3 (three) times daily.    PHQ 2/9 Scores 05/07/2019 12/30/2018 04/29/2018 12/25/2017  PHQ - 2 Score 0 0 0 0  PHQ- 9 Score 0 0 - -    BP Readings from Last 3 Encounters:  05/07/19 102/68  12/30/18 118/68  09/02/18 112/78    Physical Exam Vitals signs and nursing note reviewed.  Constitutional:      General: She is not in acute distress.    Appearance: Normal appearance. She is well-developed.  HENT:     Head: Normocephalic and atraumatic.  Neck:     Musculoskeletal: Normal range of motion.     Vascular: No carotid bruit.  Cardiovascular:     Rate and Rhythm: Normal rate and regular rhythm.     Pulses: Normal pulses.     Heart sounds: No murmur.  Pulmonary:     Effort: Pulmonary effort is normal. No respiratory distress.     Breath sounds: No wheezing or rhonchi.  Musculoskeletal:     Right lower leg: No edema.     Left lower leg: No edema.  Lymphadenopathy:     Cervical: No cervical adenopathy.  Skin:    General: Skin is  warm and dry.     Capillary Refill: Capillary refill takes less than 2 seconds.     Findings: No rash.  Neurological:     Mental Status: She is alert and oriented to person, place, and time.     Sensory: Sensory deficit (both feet) present.  Psychiatric:        Behavior: Behavior normal.        Thought Content: Thought content normal.     Wt Readings from Last 3 Encounters:  05/07/19 244 lb (110.7 kg)  12/30/18 251 lb (113.9 kg)  09/02/18 255 lb (115.7 kg)    BP 102/68   Pulse 95   Ht 5\' 8"  (1.727 m)   Wt 244 lb (110.7 kg)   SpO2 98%   BMI 37.10 kg/m   Assessment and Plan: 1. Type II diabetes mellitus with complication (HCC) Clinically stable by exam and report without s/s of hypoglycemia. DM complicated by neuropathy, HTN, lipids. Tolerating medications metformin 500 mg bid well without side effects or other concerns. - Basic metabolic panel - Hemoglobin A1c  2. Essential (primary)  hypertension Clinically stable exam with well controlled BP.   Tolerating medications, lisinopril hct, without side effects at this time. Pt to continue current regimen and low sodium diet; benefits of regular exercise as able discussed.  3. Hyperlipidemia associated with type 2 diabetes mellitus ([place]) Tolerating statin medication without side effects at this time LDL is not at goal of < 70 on current dose Will increase lipitor to 20 mg per day - pt to call for new Rx if tolerated  4. Diabetic polyneuropathy associated with type 2 diabetes mellitus (HCC) Marked improvement with Lyrica Will increase to QID - new Rx faxed to [facility] - pregabalin (LYRICA) 50 MG capsule; Take 1 capsule (50 mg total) by mouth every 6 (six) hours.  Dispense: 360 capsule; Refill: 1  5. Need for immunization against influenza - Flu Vaccine QUAD High Dose(Fluad)   Partially dictated using Editor, commissioning. Any errors are unintentional.  Halina Maidens, MD [facility]  05/07/2019

## 2019-05-08 LAB — BASIC METABOLIC PANEL
BUN/Creatinine Ratio: 19 (ref 12–28)
BUN: 20 mg/dL (ref 8–27)
CO2: 27 mmol/L (ref 20–29)
Calcium: 10.1 mg/dL (ref 8.7–10.3)
Chloride: 100 mmol/L (ref 96–106)
Creatinine, Ser: 1.05 mg/dL — ABNORMAL HIGH (ref 0.57–1.00)
GFR calc Af Amer: 62 mL/min/{1.73_m2} (ref >59)
GFR calc non Af Amer: 54 mL/min/{1.73_m2} — ABNORMAL LOW (ref >59)
Glucose: 93 mg/dL (ref 65–99)
Potassium: 5.3 mmol/L — ABNORMAL HIGH (ref 3.5–5.2)
Sodium: 143 mmol/L (ref 134–144)

## 2019-05-08 LAB — HEMOGLOBIN A1C
Est. average glucose Bld gHb Est-mCnc: 126 mg/dL
Hgb A1c MFr Bld: 6 % — ABNORMAL HIGH (ref 4.8–5.6)

## 2019-06-15 ENCOUNTER — Telehealth: Payer: Self-pay | Admitting: Surgical

## 2019-06-15 MED ORDER — ATORVASTATIN CALCIUM 20 MG PO TABS: 20.0000 mg | ORAL_TABLET | Freq: Every day | ORAL | 2 refills | Status: DC

## 2019-06-15 NOTE — Telephone Encounter (Signed)
Patient called in and left voicemail that she needs a new prescription for her Lipitor. Medication was increased at last visit so she needs new RX. Per the note it was increased. I have sent in new RX.

## 2019-07-15 ENCOUNTER — Other Ambulatory Visit: Payer: Self-pay | Admitting: Internal Medicine

## 2019-07-17 ENCOUNTER — Telehealth: Payer: Self-pay

## 2019-07-17 NOTE — Telephone Encounter (Signed)
Added patient to Covid Vaccine Waitlist for [facility] per patient.

## 2019-07-29 ENCOUNTER — Other Ambulatory Visit: Payer: Self-pay

## 2019-07-29 ENCOUNTER — Encounter (INDEPENDENT_AMBULATORY_CARE_PROVIDER_SITE_OTHER): Payer: Self-pay

## 2019-07-29 ENCOUNTER — Other Ambulatory Visit: Payer: Self-pay | Admitting: Internal Medicine

## 2019-07-29 ENCOUNTER — Ambulatory Visit
Admission: RE | Admit: 2019-07-29 | Discharge: 2019-07-29 | Disposition: A | Discharge status: Home / Self Care | Payer: Medicare Other | Source: Ambulatory Visit | Attending: Internal Medicine | Admitting: Internal Medicine

## 2019-07-29 DIAGNOSIS — Z1231 Encounter for screening mammogram for malignant neoplasm of breast: Secondary | ICD-10-CM

## 2019-08-16 ENCOUNTER — Ambulatory Visit: Payer: Medicare Other | Attending: Internal Medicine

## 2019-08-16 DIAGNOSIS — Z23 Encounter for immunization: Secondary | ICD-10-CM | POA: Insufficient documentation

## 2019-08-16 NOTE — Progress Notes (Signed)
   Covid-19 Vaccination Clinic  Name:  Kendra Walton    MRN: [id number] DOB: 06-Jan-1951  08/16/2019  [name] was observed post Covid-19 immunization for 15 minutes without incidence. She was provided with Vaccine Information Sheet and instruction to access the V-Safe system.   [name] was instructed to call 911 with any severe reactions post vaccine: Marland Kitchen Difficulty breathing  . Swelling of your face and throat  . A fast heartbeat  . A bad rash all over your body  . Dizziness and weakness    Immunizations Administered    Name Date Dose VIS Date Route   Pfizer COVID-19 Vaccine 08/16/2019  9:47 AM 0.3 mL 05/29/2019 Intramuscular   Manufacturer: [place]   Lot: [id number]   [place]: [id number]

## 2019-08-25 ENCOUNTER — Encounter: Payer: Self-pay | Admitting: Internal Medicine

## 2019-08-25 ENCOUNTER — Ambulatory Visit (INDEPENDENT_AMBULATORY_CARE_PROVIDER_SITE_OTHER): Payer: Medicare Other | Admitting: Internal Medicine

## 2019-08-25 ENCOUNTER — Other Ambulatory Visit: Payer: Self-pay

## 2019-08-25 VITALS — BP 110/62 | HR 100 | Temp 96.4°F | Ht 68.0 in | Wt 253.0 lb

## 2019-08-25 DIAGNOSIS — I10 Essential (primary) hypertension: Secondary | ICD-10-CM

## 2019-08-25 DIAGNOSIS — R918 Other nonspecific abnormal finding of lung field: Secondary | ICD-10-CM

## 2019-08-25 DIAGNOSIS — E1142 Type 2 diabetes mellitus with diabetic polyneuropathy: Secondary | ICD-10-CM

## 2019-08-25 DIAGNOSIS — E278 Other specified disorders of adrenal gland: Secondary | ICD-10-CM

## 2019-08-25 DIAGNOSIS — M170 Bilateral primary osteoarthritis of knee: Secondary | ICD-10-CM

## 2019-08-25 DIAGNOSIS — E118 Type 2 diabetes mellitus with unspecified complications: Secondary | ICD-10-CM | POA: Diagnosis not present

## 2019-08-25 MED ORDER — TRAMADOL HCL 50 MG PO TABS: ORAL_TABLET | ORAL | 5 refills | Status: DC

## 2019-08-25 MED ORDER — ONETOUCH DELICA LANCETS 33G MISC: 1.0000 | Freq: Two times a day (BID) | 3 refills | Status: DC

## 2019-08-25 MED ORDER — GLUCOSE BLOOD VI STRP: ORAL_STRIP | 12 refills | Status: DC

## 2019-08-25 MED ORDER — METFORMIN HCL ER 500 MG PO TB24: 1000.0000 mg | ORAL_TABLET | Freq: Two times a day (BID) | ORAL | 3 refills | Status: DC

## 2019-08-25 NOTE — Patient Instructions (Signed)
Schedule DM eye exam 

## 2019-08-25 NOTE — Progress Notes (Signed)
Date:  08/25/2019   Name:  Kendra Walton   DOB:  03/01/1951   MRN:  [id number]   Chief Complaint: Diabetes (Follow up.) and Hypertension  Diabetes She presents for her follow-up diabetic visit. She has type 2 diabetes mellitus. Her disease course has been stable. Pertinent negatives for hypoglycemia include no headaches or tremors. Pertinent negatives for diabetes include no chest pain, no fatigue, no polydipsia and no polyuria. Current diabetic treatment includes oral agent (monotherapy) (metformin). She is compliant with treatment all of the time. She monitors blood glucose at home 1-2 x per day. Her breakfast blood glucose is taken between 7-8 am. Her breakfast blood glucose range is generally 130-140 mg/dl. An ACE inhibitor/angiotensin II receptor blocker is being taken.  Hypertension The problem is controlled. Pertinent negatives include no chest pain, headaches, palpitations or shortness of breath. Past treatments include ACE inhibitors and diuretics.  Pulmonary nodules - 2 small nodules noted in the lungs on a CT done for adrenal mass. A follow up CT was suggested in 6 months.  She is an ex-smoker.  She does not want to pursue further testing at this time. Adrenal mass - recently scanned and thought to be a cyst.  Biochemical testing was negative.  She will follow up with Endocrinology in 6 months.  If the pulmonary nodules are seen and are larger then will need dedicated chest CT.  Lab Results  Component Value Date   CREATININE 1.05 (H) 05/07/2019   BUN 20 05/07/2019   NA 143 05/07/2019   K 5.3 (H) 05/07/2019   CL 100 05/07/2019   CO2 27 05/07/2019   Lab Results  Component Value Date   CHOL 172 12/30/2018   HDL 44 12/30/2018   LDLCALC 88 12/30/2018   TRIG 200 (H) 12/30/2018   CHOLHDL 3.9 12/30/2018   Lab Results  Component Value Date   TSH 4.580 (H) 12/30/2018   Lab Results  Component Value Date   HGBA1C 6.0 (H) 05/07/2019     Review of Systems    Constitutional: Negative for appetite change, fatigue, fever and unexpected weight change.  HENT: Negative for tinnitus and trouble swallowing.   Eyes: Negative for visual disturbance.  Respiratory: Negative for cough, chest tightness and shortness of breath.   Cardiovascular: Negative for chest pain, palpitations and leg swelling.  Gastrointestinal: Negative for abdominal pain.  Endocrine: Negative for polydipsia and polyuria.  Genitourinary: Negative for dysuria and hematuria.  Musculoskeletal: Negative for arthralgias.  Neurological: Negative for tremors, numbness and headaches.  Psychiatric/Behavioral: Negative for dysphoric mood.    Patient Active Problem List   Diagnosis Date Noted  . 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 Obesity (BMI 30-39.9) 07/09/2018  . Tingling of left upper extremity 04/29/2018  . Aortic stenosis, moderate 04/05/2017  . Post-menopausal bleeding 04/13/2016  . Hearing decreased 12/13/2015  . Leukocytosis 09/15/2015  . Absolute anemia 09/15/2015  . Hyperlipidemia associated with type 2 diabetes mellitus ([place]) 09/14/2015  . History of breast cancer in female 04/07/2015  . Acquired hypothyroidism 10/04/2014  . Essential (primary) hypertension 10/04/2014  . Arthritis of knee, degenerative 10/04/2014  . Type II diabetes mellitus with complication ([place]) [id number]    Allergies  Allergen Reactions  . Shellfish Allergy Hives and Itching    Past Surgical History:  Procedure Laterality Date  . CESAREAN SECTION    . CHOLECYSTECTOMY  2005  . ECTOPIC PREGNANCY SURGERY    . MASTECTOMY Right  2010  . REDUCTION MAMMAPLASTY Left 2011  . RIGHT/LEFT HEART CATH AND CORONARY ANGIOGRAPHY Bilateral 08/06/2018   Procedure: RIGHT/LEFT HEART CATH AND CORONARY ANGIOGRAPHY;  Surgeon: Teodoro Spray, MD;  Location: [facility];  Service: Cardiovascular;  Laterality: Bilateral;  . TRANSCATHETER  AORTIC VALVE REPLACEMENT, TRANSAPICAL  08/18/2018   [facility]    Social History   Tobacco Use  . Smoking status: Former Smoker    Years: 40.00    Types: Cigarettes    Quit date: 06/18/2012    Years since quitting: 7.1  . Smokeless tobacco: Never Used  Substance Use Topics  . Alcohol use: No    Alcohol/week: 0.0 standard drinks  . Drug use: No     Medication list has been reviewed and updated.  Current Meds  Medication Sig  . aspirin EC 81 MG tablet Take 81 mg by mouth daily.  Marland Kitchen atorvastatin (LIPITOR) 20 MG tablet Take 1 tablet (20 mg total) by mouth daily.  Marland Kitchen glucose blood (ONE TOUCH ULTRA TEST) test strip Use to test BS twice a day  . ibuprofen (ADVIL,MOTRIN) 200 MG tablet Take 600 mg by mouth every 6 (six) hours as needed for headache or moderate pain.  Marland Kitchen levothyroxine (SYNTHROID) 125 MCG tablet TAKE 2 TABLETS(250 MCG) BY MOUTH DAILY (Patient taking differently: Take 125 mcg by mouth daily before breakfast. )  . lisinopril-hydrochlorothiazide (ZESTORETIC) 10-12.5 MG tablet TAKE 1 TABLET BY MOUTH DAILY  . metFORMIN (GLUCOPHAGE-XR) 500 MG 24 hr tablet TAKE 2 TABLETS BY MOUTH TWICE DAILY (Patient taking differently: Take 1,000 mg by mouth in the morning and at bedtime. )  . Multiple Vitamin (MULTIVITAMIN WITH MINERALS) TABS tablet Take 1 tablet by mouth daily.  Glory Rosebush DELICA LANCETS 99991111 MISC 1 each by Other route 2 (two) times daily.  . Polyvinyl Alcohol-Povidone (REFRESH OP) Place 1 drop into both eyes daily as needed (dry eyes).  . pregabalin (LYRICA) 50 MG capsule Take 1 capsule (50 mg total) by mouth every 6 (six) hours. (Patient taking differently: Take 200 mg by mouth daily. )  . Pumpkin Seed-Soy Germ (AZO BLADDER CONTROL/GO-LESS) CAPS Take 1 capsule by mouth 2 (two) times daily.  . traMADol (ULTRAM) 50 MG tablet TAKE 2 TABLETS BY MOUTH EVERY 8 HOURS AS NEEDED (Patient taking differently: Take 300 mg by mouth daily. )    PHQ 2/9 Scores 08/25/2019 05/07/2019 12/30/2018 04/29/2018   PHQ - 2 Score 0 0 0 0  PHQ- 9 Score - 0 0 -    BP Readings from Last 3 Encounters:  08/25/19 110/62  05/07/19 102/68  12/30/18 118/68    Physical Exam Vitals and nursing note reviewed.  Constitutional:      General: She is not in acute distress.    Appearance: She is well-developed.  HENT:     Head: Normocephalic and atraumatic.  Cardiovascular:     Rate and Rhythm: Normal rate and regular rhythm.     Pulses: Normal pulses.     Heart sounds: No murmur.  Pulmonary:     Effort: Pulmonary effort is normal. No respiratory distress.     Breath sounds: No wheezing or rhonchi.  Musculoskeletal:     Cervical back: Normal range of motion.     Right lower leg: No edema.     Left lower leg: No edema.  Lymphadenopathy:     Cervical: No cervical adenopathy.  Skin:    General: Skin is warm and dry.     Capillary Refill: Capillary refill takes  less than 2 seconds.     Findings: No rash.  Neurological:     Mental Status: She is alert and oriented to person, place, and time.  Psychiatric:        Behavior: Behavior normal.        Thought Content: Thought content normal.     Wt Readings from Last 3 Encounters:  08/25/19 253 lb (114.8 kg)  05/07/19 244 lb (110.7 kg)  12/30/18 251 lb (113.9 kg)    BP 110/62   Pulse 100   Temp (!) 96.4 F (35.8 C) (Temporal)   Ht 5\' 8"  (1.727 m)   Wt 253 lb (114.8 kg)   SpO2 96%   BMI 38.47 kg/m   Assessment and Plan: 1. Type II diabetes mellitus with complication (HCC) Continue current medications; check A1C Reminded to get DM eye exam - Hemoglobin [id number] - OneTouch Delica Lancets [id number] MISC; 1 each by Other route 2 (two) times daily.  Dispense: 100 each; Refill: 3 - glucose blood (ONE TOUCH ULTRA TEST) test strip; Use to test BS twice a day  Dispense: 50 each; Refill: 12 - metFORMIN (GLUCOPHAGE-XR) 500 MG 24 hr tablet; Take 2 tablets (1,000 mg total) by mouth 2 (two) times daily.  Dispense: 360 tablet; Refill: 3  2. Essential (primary)  hypertension Clinically stable exam with well controlled BP. Tolerating medications without side effects at this time. Pt to continue current regimen and low sodium diet; benefits of regular exercise as able discussed.  3. 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 Pulmonary nodules Pt declines dedicated chest CT at this time  5. Diabetic polyneuropathy associated with type 2 diabetes mellitus (HCC) Doing well on Lyrica  6. Primary osteoarthritis of both knees Using tramadol daily for severe knee pain - traMADol (ULTRAM) 50 MG tablet; TAKE 2 TABLETS BY MOUTH EVERY 8 HOURS AS NEEDED  Dispense: 180 tablet; Refill: 5   Partially dictated using Editor, commissioning. Any errors are unintentional.  Halina Maidens, MD [facility]  08/25/2019

## 2019-08-26 LAB — HEMOGLOBIN A1C
Est. average glucose Bld gHb Est-mCnc: 128 mg/dL
Hgb A1c MFr Bld: 6.1 % — ABNORMAL HIGH (ref 4.8–5.6)

## 2019-09-07 ENCOUNTER — Ambulatory Visit: Payer: Medicare Other | Attending: Internal Medicine

## 2019-09-07 DIAGNOSIS — Z23 Encounter for immunization: Secondary | ICD-10-CM

## 2019-09-07 NOTE — Progress Notes (Signed)
   Covid-19 Vaccination Clinic  Name:  Kendra Walton    MRN: [id number] DOB: 07-29-1950  09/07/2019  Kendra Walton was observed post Covid-19 immunization for 15 minutes without incident. She was provided with Vaccine Information Sheet and instruction to access the V-Safe system.   Kendra Walton was instructed to call 911 with any severe reactions post vaccine: Marland Kitchen Difficulty breathing  . Swelling of face and throat  . A fast heartbeat  . A bad rash all over body  . Dizziness and weakness   Immunizations Administered    Name Date Dose VIS Date Route   Pfizer COVID-19 Vaccine 09/07/2019 10:00 AM 0.3 mL 05/29/2019 Intramuscular   Manufacturer: [place]   Lot: [id number]   [place]: [id number]

## 2019-09-19 ENCOUNTER — Other Ambulatory Visit: Payer: Self-pay | Admitting: Internal Medicine

## 2019-09-19 DIAGNOSIS — M170 Bilateral primary osteoarthritis of knee: Secondary | ICD-10-CM

## 2019-10-14 ENCOUNTER — Other Ambulatory Visit: Payer: Self-pay | Admitting: Internal Medicine

## 2019-10-20 DIAGNOSIS — Z952 Presence of prosthetic heart valve: Secondary | ICD-10-CM | POA: Diagnosis not present

## 2019-10-20 DIAGNOSIS — I5023 Acute on chronic systolic (congestive) heart failure: Secondary | ICD-10-CM | POA: Diagnosis not present

## 2019-12-07 ENCOUNTER — Telehealth: Payer: Self-pay | Admitting: Internal Medicine

## 2019-12-07 NOTE — Telephone Encounter (Unsigned)
Copied from [place] [id number]. Topic: General - Other >> Dec 07, 2019 10:21 AM Hinda Lenis D wrote: Reason for CRM: PT needs a call back in regards [employer]

## 2019-12-07 NOTE — Telephone Encounter (Signed)
Called pt back she wanted to wait until Chassidy got back on wednsday. Because " she knows what to do and what she wants. Pt verbalized that it was ok to be handled on wednesday 12/09/2019.  KP

## 2019-12-09 ENCOUNTER — Other Ambulatory Visit: Payer: Self-pay

## 2019-12-09 DIAGNOSIS — E1142 Type 2 diabetes mellitus with diabetic polyneuropathy: Secondary | ICD-10-CM

## 2019-12-09 MED ORDER — PREGABALIN 50 MG PO CAPS: 50.0000 mg | ORAL_CAPSULE | Freq: Four times a day (QID) | ORAL | 1 refills | Status: DC

## 2019-12-09 NOTE — Telephone Encounter (Signed)
Spoke with patient. She needs her new Rx for Lyrica Faxed to [facility]. Printed and told her Dr Army Melia will sign when she comes back from vacation Monday and I will fax it then. She said this was okay and verbalized understanding.   CM

## 2019-12-22 ENCOUNTER — Other Ambulatory Visit: Payer: Self-pay | Admitting: Internal Medicine

## 2019-12-22 ENCOUNTER — Telehealth: Payer: Self-pay | Admitting: Internal Medicine

## 2019-12-22 DIAGNOSIS — E1142 Type 2 diabetes mellitus with diabetic polyneuropathy: Secondary | ICD-10-CM

## 2019-12-22 MED ORDER — PREGABALIN 50 MG PO CAPS: 50.0000 mg | ORAL_CAPSULE | Freq: Four times a day (QID) | ORAL | 0 refills | Status: DC

## 2019-12-22 NOTE — Telephone Encounter (Signed)
Requested medications are due for refill today?   This medication refill cannot be delegated.  It appears the patient is trying to get assistance with getting this medication and affording this medication.    Requested medications are on active medication list?  Yes  Last Refill:   12/09/2019  # 360 with one refill and again on 12/22/2019  # 120 with no refills. Both of these prescriptions went to [facility] # [id number] in [place].  The request now is for prescription to go to [facility] in [place], [place].    Future visit scheduled?  Yes in one week.    Notes to Clinic:  This medication refill cannot be delegated.

## 2019-12-22 NOTE — Telephone Encounter (Signed)
Called pt told her that I faxed her prescription over again 12/22/2019 she stated that she would not get it until Monday 12/28/2019 that she would be without for a few days.  Pt asked if there was any samples in the office or if Dr. Army Melia could send her in something to [facility] in [place]. A 30 days supply was sent in. Pt verbalized understanding.  KP

## 2019-12-22 NOTE — Telephone Encounter (Signed)
Pt is calling and would like chassidy to return her call concerning lyrica

## 2019-12-22 NOTE — Telephone Encounter (Signed)
Called pt back told her that Kendra Walton sent it in last Monday to give [place] a call. To call back if she has any problems pt verbalized understanding.  KP

## 2019-12-22 NOTE — Telephone Encounter (Signed)
Called pt she wants her lyrica sent to [place]. Told pt I will look into it and give her a call back.  KP

## 2019-12-22 NOTE — Telephone Encounter (Unsigned)
Copied from [place] [id number]. Topic: General - Inquiry >> Dec 22, 2019 11:16 AM Alease Frame wrote: Reason for CRM: Pt called in wanting a call back from [place] regarding fax that's supposed to be sent over to lyrica . Please advise

## 2019-12-23 DIAGNOSIS — L821 Other seborrheic keratosis: Secondary | ICD-10-CM | POA: Diagnosis not present

## 2019-12-23 DIAGNOSIS — B372 Candidiasis of skin and nail: Secondary | ICD-10-CM | POA: Diagnosis not present

## 2019-12-23 DIAGNOSIS — L218 Other seborrheic dermatitis: Secondary | ICD-10-CM | POA: Diagnosis not present

## 2019-12-23 DIAGNOSIS — L308 Other specified dermatitis: Secondary | ICD-10-CM | POA: Diagnosis not present

## 2019-12-23 DIAGNOSIS — L570 Actinic keratosis: Secondary | ICD-10-CM | POA: Diagnosis not present

## 2020-01-03 ENCOUNTER — Encounter: Payer: Self-pay | Admitting: Internal Medicine

## 2020-01-03 DIAGNOSIS — I700 Atherosclerosis of aorta: Secondary | ICD-10-CM | POA: Insufficient documentation

## 2020-01-03 NOTE — Progress Notes (Signed)
Date:  01/04/2020   Name:  Kendra Walton   DOB:  1950/10/31   MRN:  [id number]   Chief Complaint: Annual Exam (breast exam no pap)  Kendra Walton is a 69 y.o. female who presents today for her Complete Annual Exam. She feels well. She reports exercising none. She reports she is sleeping well. Breast complaints none.  Mammogram: 07/2019 DEXA: none Pap smear: discontinued Colonoscopy: declined all screenings  Immunization History  Administered Date(s) Administered  . Fluad Quad(high Dose 65+) 05/07/2019  . Influenza, High Dose Seasonal PF 04/29/2018  . Influenza,inj,Quad PF,6+ Mos 04/07/2015, 04/13/2016, 07/24/2017  . Influenza-Unspecified 05/19/2014  . PFIZER SARS-COV-2 Vaccination 08/16/2019, 09/07/2019  . Pneumococcal Conjugate-13 08/08/2015  . Pneumococcal Polysaccharide-23 12/25/2017    Diabetes She presents for her follow-up diabetic visit. She has type 2 diabetes mellitus. Pertinent negatives for hypoglycemia include no dizziness, headaches, nervousness/anxiousness or tremors. Pertinent negatives for diabetes include no chest pain, no fatigue, no polydipsia and no polyuria. Symptoms are stable. Current diabetic treatment includes oral agent (monotherapy) (metformin 1000 mg bid). Her weight is stable. She monitors blood glucose at home 1-2 x per day. Her breakfast blood glucose is taken between 7-8 am. Her breakfast blood glucose range is generally 130-140 mg/dl. An ACE inhibitor/angiotensin II receptor blocker is being taken. Eye exam is not current.  Hypertension This is a chronic problem. The problem is controlled. Pertinent negatives include no chest pain, headaches, palpitations or shortness of breath. Past treatments include ACE inhibitors and diuretics. The current treatment provides significant improvement. Identifiable causes of hypertension include a thyroid problem.  Hyperlipidemia The problem is controlled. Pertinent negatives include no chest  pain or shortness of breath. Current antihyperlipidemic treatment includes statins. The current treatment provides significant improvement of lipids. There are no compliance problems.   Thyroid Problem Presents for follow-up visit. Patient reports no anxiety, constipation, diarrhea, fatigue, palpitations or tremors. The symptoms have been stable. Her past medical history is significant for hyperlipidemia.  Adrenal nodule - seen by Endocrine last year.  Non functioning by labs.  CT appearance was smaller favoring a cyst or hemorrhage.  She has a follow up for labs in August.  Lab Results  Component Value Date   CREATININE 1.05 (H) 05/07/2019   BUN 20 05/07/2019   NA 143 05/07/2019   K 5.3 (H) 05/07/2019   CL 100 05/07/2019   CO2 27 05/07/2019   Lab Results  Component Value Date   CHOL 172 12/30/2018   HDL 44 12/30/2018   LDLCALC 88 12/30/2018   TRIG 200 (H) 12/30/2018   CHOLHDL 3.9 12/30/2018   Lab Results  Component Value Date   TSH 4.580 (H) 12/30/2018   Lab Results  Component Value Date   HGBA1C 6.1 (H) 08/25/2019   Lab Results  Component Value Date   WBC 11.1 (H) 12/30/2018   HGB 10.9 (L) 12/30/2018   HCT 32.8 (L) 12/30/2018   MCV 86 12/30/2018   PLT 285 12/30/2018   Lab Results  Component Value Date   ALT 26 12/30/2018   AST 18 12/30/2018   ALKPHOS 68 12/30/2018   BILITOT 0.3 12/30/2018     Review of Systems  Constitutional: Negative for chills, fatigue and fever.  HENT: Negative for congestion, hearing loss, tinnitus, trouble swallowing and voice change.   Eyes: Negative for visual disturbance.  Respiratory: Negative for cough, chest tightness, shortness of breath and wheezing.   Cardiovascular: Negative for chest pain, palpitations and leg swelling.  Gastrointestinal:  Negative for abdominal pain, constipation, diarrhea and vomiting.  Endocrine: Negative for polydipsia and polyuria.  Genitourinary: Negative for dysuria, frequency, genital sores, vaginal  bleeding and vaginal discharge.  Musculoskeletal: Positive for arthralgias (left leg posterior left thigh). Negative for gait problem and joint swelling.  Skin: Positive for rash. Negative for color change.  Neurological: Negative for dizziness, tremors, light-headedness and headaches.  Hematological: Negative for adenopathy. Does not bruise/bleed easily.  Psychiatric/Behavioral: Negative for dysphoric mood and sleep disturbance. The patient is not nervous/anxious.     Patient Active Problem List   Diagnosis Date Noted  . Aortic atherosclerosis ([place]) 01/03/2020  . 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 Obesity (BMI 30-39.9) 07/09/2018  . Tingling of left upper extremity 04/29/2018  . Post-menopausal bleeding 04/13/2016  . Hearing decreased 12/13/2015  . Leukocytosis 09/15/2015  . Absolute anemia 09/15/2015  . Hyperlipidemia associated with type 2 diabetes mellitus ([place]) 09/14/2015  . History of breast cancer in female 04/07/2015  . Acquired hypothyroidism 10/04/2014  . Diabetic peripheral neuropathy ([place]) 10/04/2014  . Essential (primary) hypertension 10/04/2014  . Arthritis of knee, degenerative 10/04/2014  . Type II diabetes mellitus with complication ([place]) 40/01/6760    Allergies  Allergen Reactions  . Shellfish Allergy Hives and Itching    Past Surgical History:  Procedure Laterality Date  . CESAREAN SECTION    . CHOLECYSTECTOMY  2005  . ECTOPIC PREGNANCY SURGERY    . MASTECTOMY Right 2010  . REDUCTION MAMMAPLASTY Left 2011  . RIGHT/LEFT HEART CATH AND CORONARY ANGIOGRAPHY Bilateral 08/06/2018   Procedure: RIGHT/LEFT HEART CATH AND CORONARY ANGIOGRAPHY;  Surgeon: Teodoro Spray, MD;  Location: [facility];  Service: Cardiovascular;  Laterality: Bilateral;  . TRANSCATHETER AORTIC VALVE REPLACEMENT, TRANSAPICAL  08/18/2018   [facility]    Social History   Tobacco Use  . Smoking status: Former Smoker      Years: 40.00    Types: Cigarettes    Quit date: 06/18/2012    Years since quitting: 7.5  . Smokeless tobacco: Never Used  Vaping Use  . Vaping Use: Never used  Substance Use Topics  . Alcohol use: No    Alcohol/week: 0.0 standard drinks  . Drug use: No     Medication list has been reviewed and updated.  Current Meds  Medication Sig  . aspirin EC 81 MG tablet Take 81 mg by mouth daily.  Marland Kitchen atorvastatin (LIPITOR) 20 MG tablet Take 1 tablet (20 mg total) by mouth daily.  Marland Kitchen glucose blood (ONE TOUCH ULTRA TEST) test strip Use to test BS twice a day  . ibuprofen (ADVIL,MOTRIN) 200 MG tablet Take 600 mg by mouth every 6 (six) hours as needed for headache or moderate pain.  Marland Kitchen levothyroxine (SYNTHROID) 125 MCG tablet TAKE 2 TABLETS(250 MCG) BY MOUTH DAILY (Patient taking differently: Take 125 mcg by mouth daily before breakfast. )  . lisinopril-hydrochlorothiazide (ZESTORETIC) 10-12.5 MG tablet TAKE 1 TABLET BY MOUTH DAILY  . metFORMIN (GLUCOPHAGE-XR) 500 MG 24 hr tablet Take 2 tablets (1,000 mg total) by mouth 2 (two) times daily.  . Multiple Vitamin (MULTIVITAMIN WITH MINERALS) TABS tablet Take 1 tablet by mouth daily.  Glory Rosebush Delica Lancets 95K MISC 1 each by Other route 2 (two) times daily.  . Polyvinyl Alcohol-Povidone (REFRESH OP) Place 1 drop into both eyes daily as needed (dry eyes).  . pregabalin (Kendra) 50 MG capsule Take 1 capsule (50 mg total) by mouth every 6 (six)  hours.  . Pumpkin Seed-Soy Germ (AZO BLADDER CONTROL/GO-LESS) CAPS Take 1 capsule by mouth 2 (two) times daily.  . traMADol (ULTRAM) 50 MG tablet TAKE 2 TABLETS BY MOUTH EVERY 8 HOURS AS NEEDED    PHQ 2/9 Scores 01/04/2020 08/25/2019 05/07/2019 12/30/2018  PHQ - 2 Score 0 0 0 0  PHQ- 9 Score 0 - 0 0    GAD 7 : Generalized Anxiety Score 01/04/2020  Nervous, Anxious, on Edge 1  Control/stop worrying 1  Worry too much - different things 1  Trouble relaxing 0  Restless 0  Easily annoyed or irritable 1  Afraid  - awful might happen 0  Total GAD 7 Score 4  Anxiety Difficulty Not difficult at all    BP Readings from Last 3 Encounters:  01/04/20 110/78  08/25/19 110/62  05/07/19 102/68    Physical Exam Vitals and nursing note reviewed.  Constitutional:      General: She is not in acute distress.    Appearance: She is well-developed.  HENT:     Head: Normocephalic and atraumatic.     Right Ear: Tympanic membrane and ear canal normal.     Left Ear: Tympanic membrane and ear canal normal.     Nose:     Right Sinus: No maxillary sinus tenderness.     Left Sinus: No maxillary sinus tenderness.  Eyes:     General: No scleral icterus.       Right eye: No discharge.        Left eye: No discharge.     Conjunctiva/sclera: Conjunctivae normal.  Neck:     Thyroid: No thyromegaly.     Vascular: No carotid bruit.  Cardiovascular:     Rate and Rhythm: Normal rate and regular rhythm.     Pulses: Normal pulses.     Heart sounds: Normal heart sounds.  Pulmonary:     Effort: Pulmonary effort is normal. No respiratory distress.     Breath sounds: No wheezing.  Chest:     Breasts:        Right: No skin change.        Left: No mass, nipple discharge, skin change or tenderness.     Comments: Right partial mastectomy Left breast reduction Abdominal:     General: Bowel sounds are normal.     Palpations: Abdomen is soft.     Tenderness: There is no abdominal tenderness.  Musculoskeletal:     Cervical back: Normal range of motion. No erythema.     Right lower leg: No edema.     Left lower leg: No edema.  Lymphadenopathy:     Cervical: No cervical adenopathy.  Skin:    General: Skin is warm and dry.     Findings: No rash.  Neurological:     Mental Status: She is alert and oriented to person, place, and time.     Cranial Nerves: No cranial nerve deficit.     Sensory: No sensory deficit.     Deep Tendon Reflexes: Reflexes are normal and symmetric.  Psychiatric:        Attention and Perception:  Attention normal.        Mood and Affect: Mood normal.     Wt Readings from Last 3 Encounters:  01/04/20 257 lb (116.6 kg)  08/25/19 253 lb (114.8 kg)  05/07/19 244 lb (110.7 kg)    BP 110/78   Pulse 83   Temp 98.1 F (36.7 C) (Oral)   Ht 5\' 8"  (1.727 m)  Wt 257 lb (116.6 kg)   SpO2 94%   BMI 39.08 kg/m   Assessment and Plan: 1. Annual physical exam Normal exam except for weight gain Discussed need to work on weight reduction diet - POCT urinalysis dipstick  2. Encounter for screening mammogram for breast cancer Done in February Repeat annually  3. Colon cancer screening Pt declines  4. Need for hepatitis C screening test - Hepatitis C antibody  5. Essential (primary) hypertension Clinically stable exam with well controlled BP. Tolerating medications without side effects at this time. Pt to continue current regimen and low sodium diet; benefits of regular exercise as able discussed. - CBC with Differential/Platelet  6. Type II diabetes mellitus with complication (HCC) Clinically stable by exam and report without s/s of hypoglycemia. Increase in fasting BS recently - likely due to weight gain Will consider adding medication if S9G > 7 DM complicated by htn and lipids. Tolerating medications - max dose metformin-  well without side effects or other concerns. - Comprehensive metabolic panel - Hemoglobin A1c  7. Hyperlipidemia associated with type 2 diabetes mellitus ([place]) On high intensity statin therapy - Lipid panel  8. Acquired hypothyroidism supplemented - TSH + free T4  9. Aortic atherosclerosis (HCC) Continue statin and aspirin for risk reduction  10. Primary osteoarthritis of both knees Continue tramadol bid PRN   Partially dictated using Editor, commissioning. Any errors are unintentional.  Halina Maidens, MD [facility]  01/04/2020

## 2020-01-04 ENCOUNTER — Encounter: Payer: Self-pay | Admitting: Internal Medicine

## 2020-01-04 ENCOUNTER — Ambulatory Visit (INDEPENDENT_AMBULATORY_CARE_PROVIDER_SITE_OTHER): Payer: Medicare Other | Admitting: Internal Medicine

## 2020-01-04 ENCOUNTER — Other Ambulatory Visit: Payer: Self-pay

## 2020-01-04 VITALS — BP 110/78 | HR 83 | Temp 98.1°F | Ht 68.0 in | Wt 257.0 lb

## 2020-01-04 DIAGNOSIS — Z1231 Encounter for screening mammogram for malignant neoplasm of breast: Secondary | ICD-10-CM | POA: Diagnosis not present

## 2020-01-04 DIAGNOSIS — I10 Essential (primary) hypertension: Secondary | ICD-10-CM

## 2020-01-04 DIAGNOSIS — Z1211 Encounter for screening for malignant neoplasm of colon: Secondary | ICD-10-CM | POA: Diagnosis not present

## 2020-01-04 DIAGNOSIS — Z1159 Encounter for screening for other viral diseases: Secondary | ICD-10-CM

## 2020-01-04 DIAGNOSIS — E1169 Type 2 diabetes mellitus with other specified complication: Secondary | ICD-10-CM | POA: Diagnosis not present

## 2020-01-04 DIAGNOSIS — E039 Hypothyroidism, unspecified: Secondary | ICD-10-CM | POA: Diagnosis not present

## 2020-01-04 DIAGNOSIS — E785 Hyperlipidemia, unspecified: Secondary | ICD-10-CM

## 2020-01-04 DIAGNOSIS — I700 Atherosclerosis of aorta: Secondary | ICD-10-CM

## 2020-01-04 DIAGNOSIS — Z0000 Encounter for general adult medical examination without abnormal findings: Secondary | ICD-10-CM | POA: Diagnosis not present

## 2020-01-04 DIAGNOSIS — E118 Type 2 diabetes mellitus with unspecified complications: Secondary | ICD-10-CM | POA: Diagnosis not present

## 2020-01-04 DIAGNOSIS — M170 Bilateral primary osteoarthritis of knee: Secondary | ICD-10-CM

## 2020-01-04 LAB — POCT URINALYSIS DIPSTICK
Bilirubin, UA: NEGATIVE
Blood, UA: NEGATIVE
Glucose, UA: NEGATIVE
Ketones, UA: NEGATIVE
Leukocytes, UA: NEGATIVE
Nitrite, UA: NEGATIVE
Protein, UA: NEGATIVE
Spec Grav, UA: 1.015 (ref 1.010–1.025)
Urobilinogen, UA: 0.2 E.U./dL
pH, UA: 6 (ref 5.0–8.0)

## 2020-01-05 LAB — HEMOGLOBIN A1C
Est. average glucose Bld gHb Est-mCnc: 134 mg/dL
Hgb A1c MFr Bld: 6.3 % — ABNORMAL HIGH (ref 4.8–5.6)

## 2020-01-05 LAB — CBC WITH DIFFERENTIAL/PLATELET
Basophils Absolute: 0.1 10*3/uL (ref 0.0–0.2)
Basos: 1 %
EOS (ABSOLUTE): 0.3 10*3/uL (ref 0.0–0.4)
Eos: 3 %
Hematocrit: 33.2 % — ABNORMAL LOW (ref 34.0–46.6)
Hemoglobin: 10.8 g/dL — ABNORMAL LOW (ref 11.1–15.9)
Immature Grans (Abs): 0 10*3/uL (ref 0.0–0.1)
Immature Granulocytes: 0 %
Lymphocytes Absolute: 3.1 10*3/uL (ref 0.7–3.1)
Lymphs: 25 %
MCH: 26.9 pg (ref 26.6–33.0)
MCHC: 32.5 g/dL (ref 31.5–35.7)
MCV: 83 fL (ref 79–97)
Monocytes Absolute: 0.7 10*3/uL (ref 0.1–0.9)
Monocytes: 6 %
Neutrophils Absolute: 7.9 10*3/uL — ABNORMAL HIGH (ref 1.4–7.0)
Neutrophils: 65 %
Platelets: 294 10*3/uL (ref 150–450)
RBC: 4.01 x10E6/uL (ref 3.77–5.28)
RDW: 13.3 % (ref 11.7–15.4)
WBC: 12.1 10*3/uL — ABNORMAL HIGH (ref 3.4–10.8)

## 2020-01-05 LAB — TSH+FREE T4
Free T4: 1.31 ng/dL (ref 0.82–1.77)
TSH: 7.05 u[IU]/mL — ABNORMAL HIGH (ref 0.450–4.500)

## 2020-01-05 LAB — COMPREHENSIVE METABOLIC PANEL
ALT: 26 IU/L (ref 0–32)
AST: 23 IU/L (ref 0–40)
Albumin/Globulin Ratio: 1.3 (ref 1.2–2.2)
Albumin: 4.4 g/dL (ref 3.7–4.7)
Alkaline Phosphatase: 81 IU/L (ref 48–121)
BUN/Creatinine Ratio: 19 (ref 12–28)
BUN: 19 mg/dL (ref 8–27)
Bilirubin Total: 0.3 mg/dL (ref 0.0–1.2)
CO2: 28 mmol/L (ref 20–29)
Calcium: 9.5 mg/dL (ref 8.7–10.3)
Chloride: 99 mmol/L (ref 96–106)
Creatinine, Ser: 1.02 mg/dL — ABNORMAL HIGH (ref 0.57–1.00)
GFR calc Af Amer: 64 mL/min/{1.73_m2} (ref >59)
GFR calc non Af Amer: 55 mL/min/{1.73_m2} — ABNORMAL LOW (ref >59)
Globulin, Total: 3.3 g/dL (ref 1.5–4.5)
Glucose: 112 mg/dL — ABNORMAL HIGH (ref 65–99)
Potassium: 5.2 mmol/L (ref 3.5–5.2)
Sodium: 141 mmol/L (ref 134–144)
Total Protein: 7.7 g/dL (ref 6.0–8.5)

## 2020-01-05 LAB — LIPID PANEL
Chol/HDL Ratio: 3.2 ratio (ref 0.0–4.4)
Cholesterol, Total: 145 mg/dL (ref 100–199)
HDL: 45 mg/dL (ref >39)
LDL Chol Calc (NIH): 68 mg/dL (ref 0–99)
Triglycerides: 193 mg/dL — ABNORMAL HIGH (ref 0–149)
VLDL Cholesterol Cal: 32 mg/dL (ref 5–40)

## 2020-01-05 LAB — HEPATITIS C ANTIBODY: Hep C Virus Ab: <0.1 s/co ratio (ref 0.0–0.9)

## 2020-01-06 DIAGNOSIS — L814 Other melanin hyperpigmentation: Secondary | ICD-10-CM | POA: Diagnosis not present

## 2020-01-06 DIAGNOSIS — L218 Other seborrheic dermatitis: Secondary | ICD-10-CM | POA: Diagnosis not present

## 2020-01-06 DIAGNOSIS — L308 Other specified dermatitis: Secondary | ICD-10-CM | POA: Diagnosis not present

## 2020-01-06 DIAGNOSIS — L821 Other seborrheic keratosis: Secondary | ICD-10-CM | POA: Diagnosis not present

## 2020-02-01 DIAGNOSIS — E278 Other specified disorders of adrenal gland: Secondary | ICD-10-CM | POA: Diagnosis not present

## 2020-02-15 DIAGNOSIS — E039 Hypothyroidism, unspecified: Secondary | ICD-10-CM | POA: Diagnosis not present

## 2020-02-15 DIAGNOSIS — E278 Other specified disorders of adrenal gland: Secondary | ICD-10-CM | POA: Diagnosis not present

## 2020-02-18 ENCOUNTER — Telehealth: Payer: Self-pay

## 2020-02-18 NOTE — Chronic Care Management (AMB) (Signed)
  Chronic Care Management   Note  02/18/2020 Name: LOGAN VEGH MRN: [id number] DOB: Jul 07, 1950  Arlie Solomons Piascik is a 69 y.o. year old female who is a primary care patient of Glean Hess, MD. I reached out to Josefine Class by phone today in response to a referral sent by Ms. Arlie Solomons Ibe's health plan.     Ms. Mcree was given information about Chronic Care Management services today including:  1. CCM service includes personalized support from designated clinical staff supervised by her physician, including individualized plan of care and coordination with other care providers 2. 24/7 contact phone numbers for assistance for urgent and routine care needs. 3. Service will only be billed when office clinical staff spend 20 minutes or more in a month to coordinate care. 4. Only one practitioner may furnish and bill the service in a calendar month. 5. The patient may stop CCM services at any time (effective at the end of the month) by phone call to the office staff. 6. The patient will be responsible for cost sharing (co-pay) of up to 20% of the service fee (after annual deductible is met).  Patient did not agree to enrollment in care management services and does not wish to consider at this time.  Follow up plan: The patient has been provided with contact information for the care management team and has been advised to call with any health related questions or concerns.   Noreene Larsson, [place], [place], [place] [address] Direct Dial: [phone] [email or web address]: [email or web address]

## 2020-03-11 ENCOUNTER — Other Ambulatory Visit: Payer: Self-pay | Admitting: Internal Medicine

## 2020-03-11 ENCOUNTER — Other Ambulatory Visit: Payer: Self-pay

## 2020-03-11 DIAGNOSIS — M170 Bilateral primary osteoarthritis of knee: Secondary | ICD-10-CM

## 2020-03-11 NOTE — Telephone Encounter (Signed)
Requested medication (s) are due for refill today: yes  Requested medication (s) are on the active medication list: yes  Last refill:  09/19/19  #180  5 refills  Future visit scheduled:yes  Notes to clinic:  Not delegated    Requested Prescriptions  Pending Prescriptions Disp Refills   traMADol (ULTRAM) 50 MG tablet [Pharmacy Med Name: TRAMADOL 50MG  TABLETS] 180 tablet     Sig: TAKE 2 TABLETS BY MOUTH EVERY 8 HOURS AS NEEDED      Not Delegated - Analgesics:  Opioid Agonists Failed - 03/11/2020 12:23 PM      Failed - This refill cannot be delegated      Failed - Urine Drug Screen completed in last 360 days.      Passed - Valid encounter within last 6 months    Recent Outpatient Visits           2 months ago Annual physical exam   [facility] Glean Hess, MD   6 months ago Type II diabetes mellitus with complication [facility])   [facility] Glean Hess, MD   10 months ago Type II diabetes mellitus with complication [facility])   [facility] Glean Hess, MD   1 year ago Annual physical exam   [facility] Glean Hess, MD   1 year ago Essential (primary) hypertension   Berry, MD       Future Appointments             In 1 month Army Melia Jesse Sans, MD [facility], [facility]              atorvastatin (LIPITOR) 20 MG tablet [Pharmacy Med Name: ATORVASTATIN 20MG  TABLETS] 90 tablet 2    Sig: TAKE 1 TABLET(20 MG) BY MOUTH DAILY      Cardiovascular:  Antilipid - Statins Failed - 03/11/2020 12:23 PM      Failed - LDL in normal range and within 360 days    LDL Chol Calc (NIH)  Date Value Ref Range Status  01/04/2020 68 0 - 99 mg/dL Final          Failed - Triglycerides in normal range and within 360 days    Triglycerides  Date Value Ref Range Status  01/04/2020 193 (H) 0 - 149 mg/dL Final          Passed - Total Cholesterol in normal range and within 360 days    Cholesterol,  Total  Date Value Ref Range Status  01/04/2020 145 100 - 199 mg/dL Final          Passed - HDL in normal range and within 360 days    HDL  Date Value Ref Range Status  01/04/2020 45 >39 mg/dL Final          Passed - Patient is not pregnant      Passed - Valid encounter within last 12 months    Recent Outpatient Visits           2 months ago Annual physical exam   [facility] Glean Hess, MD   6 months ago Type II diabetes mellitus with complication [facility])   [facility] Glean Hess, MD   10 months ago Type II diabetes mellitus with complication [facility])   [facility] Glean Hess, MD   1 year ago Annual physical exam   [facility] Glean Hess, MD   1 year  ago Essential (primary) hypertension   [facility] Glean Hess, MD       Future Appointments             In 1 month Army Melia, Jesse Sans, MD [facility], [facility]

## 2020-03-14 ENCOUNTER — Other Ambulatory Visit: Payer: Self-pay | Admitting: Internal Medicine

## 2020-03-14 ENCOUNTER — Telehealth: Payer: Self-pay | Admitting: Internal Medicine

## 2020-03-14 DIAGNOSIS — M170 Bilateral primary osteoarthritis of knee: Secondary | ICD-10-CM

## 2020-03-14 MED ORDER — TRAMADOL HCL 50 MG PO TABS: 100.0000 mg | ORAL_TABLET | Freq: Three times a day (TID) | ORAL | 0 refills | Status: DC | PRN

## 2020-03-14 NOTE — Telephone Encounter (Signed)
I just resent this Rx.

## 2020-03-14 NOTE — Telephone Encounter (Signed)
Copied from [place] [id number]. Topic: General - Other >> Mar 14, 2020  2:58 PM Wynetta Emery, [place] C wrote: Reason for CRM: pt says that she was told by pharmacy that they have not received Rx for traMADol (ULTRAM) 50 MG tablet, verified pharmacy, pt states correct.   Please assist further

## 2020-03-15 NOTE — Telephone Encounter (Signed)
Patient informed.   CM 

## 2020-03-18 ENCOUNTER — Other Ambulatory Visit: Payer: Self-pay | Admitting: Internal Medicine

## 2020-03-18 DIAGNOSIS — E118 Type 2 diabetes mellitus with unspecified complications: Secondary | ICD-10-CM

## 2020-04-11 ENCOUNTER — Other Ambulatory Visit: Payer: Self-pay | Admitting: Internal Medicine

## 2020-04-11 DIAGNOSIS — M170 Bilateral primary osteoarthritis of knee: Secondary | ICD-10-CM

## 2020-04-11 NOTE — Telephone Encounter (Signed)
Requested medication (s) are due for refill today:due 04/13/20  Requested medication (s) are on the active medication list:  yes  Last refill: 03/14/20  #10  0 refills  Future visit scheduled  yes 05/09/20  Notes to clinic: not delegated  Requested Prescriptions  Pending Prescriptions Disp Refills   traMADol (ULTRAM) 50 MG tablet [Pharmacy Med Name: TRAMADOL 50MG  TABLETS] 180 tablet     Sig: TAKE 2 TABLETS(100 MG) BY MOUTH EVERY 8 HOURS AS NEEDED      Not Delegated - Analgesics:  Opioid Agonists Failed - 04/11/2020  4:51 PM      Failed - This refill cannot be delegated      Failed - Urine Drug Screen completed in last 360 days.      Passed - Valid encounter within last 6 months    Recent Outpatient Visits           3 months ago Annual physical exam   [facility] Glean Hess, MD   7 months ago Type II diabetes mellitus with complication [facility])   [facility] Glean Hess, MD   11 months ago Type II diabetes mellitus with complication [facility])   [facility] Glean Hess, MD   1 year ago Annual physical exam   [facility] Glean Hess, MD   1 year ago Essential (primary) hypertension   [facility] Glean Hess, MD       Future Appointments             In 4 weeks Army Melia Jesse Sans, MD [facility], [facility]

## 2020-04-12 NOTE — Telephone Encounter (Signed)
Please Advise. Last office visit 01/04/2020.  KP

## 2020-04-25 DIAGNOSIS — M170 Bilateral primary osteoarthritis of knee: Secondary | ICD-10-CM | POA: Diagnosis not present

## 2020-04-25 DIAGNOSIS — M7122 Synovial cyst of popliteal space [Baker], left knee: Secondary | ICD-10-CM | POA: Diagnosis not present

## 2020-04-25 DIAGNOSIS — G8929 Other chronic pain: Secondary | ICD-10-CM | POA: Diagnosis not present

## 2020-04-25 DIAGNOSIS — M25562 Pain in left knee: Secondary | ICD-10-CM | POA: Diagnosis not present

## 2020-04-25 DIAGNOSIS — M25561 Pain in right knee: Secondary | ICD-10-CM | POA: Diagnosis not present

## 2020-05-09 ENCOUNTER — Ambulatory Visit (INDEPENDENT_AMBULATORY_CARE_PROVIDER_SITE_OTHER): Payer: Medicare Other | Admitting: Internal Medicine

## 2020-05-09 ENCOUNTER — Encounter: Payer: Self-pay | Admitting: Internal Medicine

## 2020-05-09 ENCOUNTER — Other Ambulatory Visit: Payer: Self-pay

## 2020-05-09 VITALS — BP 124/60 | HR 93 | Temp 97.9°F | Ht 68.0 in | Wt 254.0 lb

## 2020-05-09 DIAGNOSIS — Z23 Encounter for immunization: Secondary | ICD-10-CM | POA: Diagnosis not present

## 2020-05-09 DIAGNOSIS — E118 Type 2 diabetes mellitus with unspecified complications: Secondary | ICD-10-CM

## 2020-05-09 DIAGNOSIS — E1142 Type 2 diabetes mellitus with diabetic polyneuropathy: Secondary | ICD-10-CM

## 2020-05-09 DIAGNOSIS — E278 Other specified disorders of adrenal gland: Secondary | ICD-10-CM | POA: Diagnosis not present

## 2020-05-09 DIAGNOSIS — E039 Hypothyroidism, unspecified: Secondary | ICD-10-CM

## 2020-05-09 NOTE — Progress Notes (Signed)
Date:  05/09/2020   Name:  Kendra Walton   DOB:  1950-10-25   MRN:  [id number]   Chief Complaint: Diabetes (f/u last reading thie morning 144) and Hypertension  Diabetes She presents for her follow-up diabetic visit. She has type 2 diabetes mellitus. Her disease course has been stable. Pertinent negatives for hypoglycemia include no headaches or tremors. Associated symptoms include foot paresthesias. Pertinent negatives for diabetes include no chest pain, no fatigue, no polydipsia and no polyuria. Symptoms are stable. Current diabetic treatment includes oral agent (monotherapy). She is compliant with treatment all of the time. She is following a generally healthy diet. Her breakfast blood glucose is taken between 6-7 am. Her breakfast blood glucose range is generally 110-130 mg/dl. An ACE inhibitor/angiotensin II receptor blocker is being taken. Eye exam is not current (appt in January).  Eye exam is overdue.  appt scheduled in Jan 2022.  Lab Results  Component Value Date   CREATININE 1.02 (H) 01/04/2020   BUN 19 01/04/2020   NA 141 01/04/2020   K 5.2 01/04/2020   CL 99 01/04/2020   CO2 28 01/04/2020   Lab Results  Component Value Date   CHOL 145 01/04/2020   HDL 45 01/04/2020   LDLCALC 68 01/04/2020   TRIG 193 (H) 01/04/2020   CHOLHDL 3.2 01/04/2020   Lab Results  Component Value Date   TSH 7.050 (H) 01/04/2020   Lab Results  Component Value Date   HGBA1C 6.3 (H) 01/04/2020   Lab Results  Component Value Date   WBC 12.1 (H) 01/04/2020   HGB 10.8 (L) 01/04/2020   HCT 33.2 (L) 01/04/2020   MCV 83 01/04/2020   PLT 294 01/04/2020   Lab Results  Component Value Date   ALT 26 01/04/2020   AST 23 01/04/2020   ALKPHOS 81 01/04/2020   BILITOT 0.3 01/04/2020     Review of Systems  Constitutional: Negative for appetite change, fatigue, fever and unexpected weight change.  HENT: Negative for tinnitus and trouble swallowing.   Eyes: Negative for visual  disturbance.  Respiratory: Negative for cough, chest tightness and shortness of breath.   Cardiovascular: Negative for chest pain, palpitations and leg swelling.  Gastrointestinal: Negative for abdominal pain.  Endocrine: Negative for polydipsia and polyuria.  Genitourinary: Negative for dysuria and hematuria.  Musculoskeletal: Positive for myalgias (foot neuropathy). Negative for arthralgias (knee OA).  Neurological: Negative for tremors, numbness and headaches.  Psychiatric/Behavioral: Negative for dysphoric mood.    Patient Active Problem List   Diagnosis Date Noted  . Aortic atherosclerosis ([place]) 01/03/2020  . 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 Adrenal nodule ([place]) 09/02/2018  . Obesity (BMI 30-39.9) 07/09/2018  . Tingling of left upper extremity 04/29/2018  . Post-menopausal bleeding 04/13/2016  . Hearing decreased 12/13/2015  . Leukocytosis 09/15/2015  . Absolute anemia 09/15/2015  . Hyperlipidemia associated with type 2 diabetes mellitus ([place]) 09/14/2015  . History of breast cancer in female 04/07/2015  . Acquired hypothyroidism 10/04/2014  . Diabetic peripheral neuropathy ([place]) 10/04/2014  . Essential (primary) hypertension 10/04/2014  . Arthritis of knee, degenerative 10/04/2014  . Type II diabetes mellitus with complication ([place]) 35/00/9381    Allergies  Allergen Reactions  . Shellfish Allergy Hives and Itching    Past Surgical History:  Procedure Laterality Date  . CESAREAN SECTION    . CHOLECYSTECTOMY  2005  . ECTOPIC PREGNANCY SURGERY    . MASTECTOMY Right 2010   partial  . REDUCTION MAMMAPLASTY Left  2011  . RIGHT/LEFT HEART CATH AND CORONARY ANGIOGRAPHY Bilateral 08/06/2018   Procedure: RIGHT/LEFT HEART CATH AND CORONARY ANGIOGRAPHY;  Surgeon: Teodoro Spray, MD;  Location: [facility];  Service: Cardiovascular;  Laterality: Bilateral;  . TRANSCATHETER AORTIC VALVE REPLACEMENT, TRANSAPICAL  08/18/2018   [facility]     Social History   Tobacco Use  . Smoking status: Former Smoker    Years: 40.00    Types: Cigarettes    Quit date: 06/18/2012    Years since quitting: 7.8  . Smokeless tobacco: Never Used  Vaping Use  . Vaping Use: Never used  Substance Use Topics  . Alcohol use: No    Alcohol/week: 0.0 standard drinks  . Drug use: No     Medication list has been reviewed and updated.  Current Meds  Medication Sig  . aspirin EC 81 MG tablet Take 81 mg by mouth daily.  Marland Kitchen atorvastatin (LIPITOR) 20 MG tablet TAKE 1 TABLET(20 MG) BY MOUTH DAILY  . glucose blood (ONE TOUCH ULTRA TEST) test strip Use to test BS twice a day  . hydrocortisone 2.5 % cream Apply topically.  Marland Kitchen ibuprofen (ADVIL,MOTRIN) 200 MG tablet Take 600 mg by mouth every 6 (six) hours as needed for headache or moderate pain.  Marland Kitchen ketoconazole (NIZORAL) 2 % cream Apply 1 application topically daily as needed.  . Lancets (ONETOUCH DELICA PLUS HERDEY81K) MISC USE 1 EACH TWICE DAILY  . levothyroxine (SYNTHROID) 125 MCG tablet TAKE 2 TABLETS(250 MCG) BY MOUTH DAILY (Patient taking differently: Take 125 mcg by mouth daily before breakfast. )  . lisinopril-hydrochlorothiazide (ZESTORETIC) 10-12.5 MG tablet TAKE 1 TABLET BY MOUTH DAILY  . metFORMIN (GLUCOPHAGE-XR) 500 MG 24 hr tablet Take 2 tablets (1,000 mg total) by mouth 2 (two) times daily.  . Multiple Vitamin (MULTIVITAMIN WITH MINERALS) TABS tablet Take 1 tablet by mouth daily.  . Polyvinyl Alcohol-Povidone (REFRESH OP) Place 1 drop into both eyes daily as needed (dry eyes).  . pregabalin (LYRICA) 50 MG capsule Take 1 capsule (50 mg total) by mouth every 6 (six) hours.  . Pumpkin Seed-Soy Germ (AZO BLADDER CONTROL/GO-LESS) CAPS Take 1 capsule by mouth 2 (two) times daily.  . traMADol (ULTRAM) 50 MG tablet TAKE 2 TABLETS(100 MG) BY MOUTH EVERY 8 HOURS AS NEEDED    PHQ 2/9 Scores 05/09/2020 01/04/2020 08/25/2019 05/07/2019  PHQ - 2 Score 0 0 0 0  PHQ- 9 Score 0 0 - 0    GAD 7 :  Generalized Anxiety Score 05/09/2020 01/04/2020  Nervous, Anxious, on Edge 0 1  Control/stop worrying 0 1  Worry too much - different things 0 1  Trouble relaxing 0 0  Restless 0 0  Easily annoyed or irritable 0 1  Afraid - awful might happen 0 0  Total GAD 7 Score 0 4  Anxiety Difficulty - Not difficult at all    BP Readings from Last 3 Encounters:  05/09/20 124/60  01/04/20 110/78  08/25/19 110/62    Physical Exam Vitals and nursing note reviewed.  Constitutional:      General: She is not in acute distress.    Appearance: She is well-developed.  HENT:     Head: Normocephalic and atraumatic.  Neck:     Vascular: No carotid bruit.  Cardiovascular:     Rate and Rhythm: Normal rate and regular rhythm.     Pulses: Normal pulses.     Heart sounds: No murmur heard.   Pulmonary:     Effort: Pulmonary effort is  normal. No respiratory distress.  Musculoskeletal:     Right lower leg: No edema.     Left lower leg: No edema.  Lymphadenopathy:     Cervical: No cervical adenopathy.  Skin:    General: Skin is warm and dry.     Findings: No rash.  Neurological:     Mental Status: She is alert and oriented to person, place, and time.     Wt Readings from Last 3 Encounters:  05/09/20 254 lb (115.2 kg)  01/04/20 257 lb (116.6 kg)  08/25/19 253 lb (114.8 kg)    BP 124/60   Pulse 93   Temp 97.9 F (36.6 C) (Oral)   Ht 5\' 8"  (1.727 m)   Wt 254 lb (115.2 kg)   SpO2 94%   BMI 38.62 kg/m   Assessment and Plan: 1. Type II diabetes mellitus with complication (HCC) Clinically stable by exam and report without s/s of hypoglycemia. DM complicated by HTN, lipids, obesity. Tolerating medications well without side effects or other concerns. - Basic metabolic panel - Hemoglobin A1c  2. Adrenal nodule (HCC) Followed by Endo  3. Need for immunization against influenza - Flu Vaccine QUAD High Dose(Fluad)  4. Diabetic polyneuropathy associated with type 2 diabetes mellitus  ([place]) Continue Lyrica Call for Rx when needed   Partially dictated using Dragon software. Any errors are unintentional.  Halina Maidens, MD [facility]  05/09/2020

## 2020-05-10 LAB — BASIC METABOLIC PANEL
BUN/Creatinine Ratio: 16 (ref 12–28)
BUN: 16 mg/dL (ref 8–27)
CO2: 27 mmol/L (ref 20–29)
Calcium: 9.9 mg/dL (ref 8.7–10.3)
Chloride: 101 mmol/L (ref 96–106)
Creatinine, Ser: 1.01 mg/dL — ABNORMAL HIGH (ref 0.57–1.00)
GFR calc Af Amer: 65 mL/min/{1.73_m2} (ref >59)
GFR calc non Af Amer: 56 mL/min/{1.73_m2} — ABNORMAL LOW (ref >59)
Glucose: 108 mg/dL — ABNORMAL HIGH (ref 65–99)
Potassium: 5.4 mmol/L — ABNORMAL HIGH (ref 3.5–5.2)
Sodium: 142 mmol/L (ref 134–144)

## 2020-05-10 LAB — HEMOGLOBIN A1C
Est. average glucose Bld gHb Est-mCnc: 134 mg/dL
Hgb A1c MFr Bld: 6.3 % — ABNORMAL HIGH (ref 4.8–5.6)

## 2020-05-11 DIAGNOSIS — E119 Type 2 diabetes mellitus without complications: Secondary | ICD-10-CM | POA: Diagnosis not present

## 2020-05-11 DIAGNOSIS — M1711 Unilateral primary osteoarthritis, right knee: Secondary | ICD-10-CM | POA: Diagnosis not present

## 2020-05-11 DIAGNOSIS — M1712 Unilateral primary osteoarthritis, left knee: Secondary | ICD-10-CM | POA: Diagnosis not present

## 2020-05-13 ENCOUNTER — Other Ambulatory Visit: Payer: Self-pay | Admitting: Internal Medicine

## 2020-05-13 DIAGNOSIS — M170 Bilateral primary osteoarthritis of knee: Secondary | ICD-10-CM

## 2020-05-13 NOTE — Telephone Encounter (Signed)
Requested medication (s) are due for refill today:   Provider to decide  Requested medication (s) are on the active medication list:   Yes  Future visit scheduled:   Yes   Last ordered: 04/12/2020 #180, R 0  Non delegated refill   Requested Prescriptions  Pending Prescriptions Disp Refills   traMADol (ULTRAM) 50 MG tablet [Pharmacy Med Name: TRAMADOL 50MG  TABLETS] 180 tablet     Sig: TAKE 2 TABLETS(100 MG) BY MOUTH EVERY 8 HOURS AS NEEDED      Not Delegated - Analgesics:  Opioid Agonists Failed - 05/13/2020  9:59 AM      Failed - This refill cannot be delegated      Failed - Urine Drug Screen completed in last 360 days      Passed - Valid encounter within last 6 months    Recent Outpatient Visits           4 days ago Type II diabetes mellitus with complication [facility])   [facility] Glean Hess, MD   4 months ago Annual physical exam   [facility] Glean Hess, MD   8 months ago Type II diabetes mellitus with complication [facility])   [facility] Glean Hess, MD   1 year ago Type II diabetes mellitus with complication [facility])   [facility] Glean Hess, MD   1 year ago Annual physical exam   [facility] Glean Hess, MD       Future Appointments             In 3 months Army Melia Jesse Sans, MD [facility], [facility]

## 2020-05-16 ENCOUNTER — Telehealth: Payer: Self-pay | Admitting: Internal Medicine

## 2020-05-16 ENCOUNTER — Other Ambulatory Visit: Payer: Self-pay | Admitting: Internal Medicine

## 2020-05-16 DIAGNOSIS — M170 Bilateral primary osteoarthritis of knee: Secondary | ICD-10-CM

## 2020-05-16 MED ORDER — TRAMADOL HCL 50 MG PO TABS: 100.0000 mg | ORAL_TABLET | Freq: Three times a day (TID) | ORAL | 2 refills | Status: DC | PRN

## 2020-05-16 NOTE — Telephone Encounter (Signed)
Patient is calling to check on the status of the request of her medication refill. Patient states that she is out of medication. Please advise Thank you

## 2020-05-16 NOTE — Telephone Encounter (Signed)
Patient needs refill on tramadol to be filled by pharmacy today - out of medicine due to denial from [facility] center over the [place]. Sent to Dr Army Melia and she sent in refill.

## 2020-05-16 NOTE — Telephone Encounter (Signed)
Please Advise. Last office visit 05/09/20.  KP

## 2020-05-16 NOTE — Telephone Encounter (Signed)
Called pt informed her that tramadol was sent to pharmacy. Pt verbalized understanding.  KP

## 2020-05-17 DIAGNOSIS — E039 Hypothyroidism, unspecified: Secondary | ICD-10-CM | POA: Diagnosis not present

## 2020-06-21 DIAGNOSIS — E119 Type 2 diabetes mellitus without complications: Secondary | ICD-10-CM | POA: Diagnosis not present

## 2020-06-21 LAB — HM DIABETES EYE EXAM

## 2020-06-23 ENCOUNTER — Encounter: Payer: Self-pay | Admitting: Internal Medicine

## 2020-07-01 ENCOUNTER — Encounter: Payer: Self-pay | Admitting: Internal Medicine

## 2020-07-01 ENCOUNTER — Other Ambulatory Visit: Payer: Self-pay

## 2020-07-01 ENCOUNTER — Other Ambulatory Visit: Payer: Self-pay | Admitting: Internal Medicine

## 2020-07-01 DIAGNOSIS — E1142 Type 2 diabetes mellitus with diabetic polyneuropathy: Secondary | ICD-10-CM

## 2020-07-01 MED ORDER — PREGABALIN 50 MG PO CAPS: 50.0000 mg | ORAL_CAPSULE | Freq: Four times a day (QID) | ORAL | 1 refills | Status: DC

## 2020-07-04 ENCOUNTER — Encounter: Payer: Self-pay | Admitting: Internal Medicine

## 2020-07-04 ENCOUNTER — Other Ambulatory Visit: Payer: Self-pay | Admitting: Internal Medicine

## 2020-07-04 DIAGNOSIS — E1142 Type 2 diabetes mellitus with diabetic polyneuropathy: Secondary | ICD-10-CM

## 2020-07-04 MED ORDER — PREGABALIN 50 MG PO CAPS: 50.0000 mg | ORAL_CAPSULE | Freq: Four times a day (QID) | ORAL | 1 refills | Status: DC

## 2020-07-12 ENCOUNTER — Encounter: Payer: Self-pay | Admitting: Internal Medicine

## 2020-07-12 DIAGNOSIS — M1711 Unilateral primary osteoarthritis, right knee: Secondary | ICD-10-CM | POA: Diagnosis not present

## 2020-07-12 DIAGNOSIS — E119 Type 2 diabetes mellitus without complications: Secondary | ICD-10-CM | POA: Diagnosis not present

## 2020-07-13 ENCOUNTER — Other Ambulatory Visit: Payer: Self-pay | Admitting: Internal Medicine

## 2020-07-13 DIAGNOSIS — E1142 Type 2 diabetes mellitus with diabetic polyneuropathy: Secondary | ICD-10-CM

## 2020-07-13 MED ORDER — PREGABALIN 50 MG PO CAPS: 50.0000 mg | ORAL_CAPSULE | Freq: Four times a day (QID) | ORAL | 5 refills | Status: DC

## 2020-07-25 DIAGNOSIS — H01009 Unspecified blepharitis unspecified eye, unspecified eyelid: Secondary | ICD-10-CM | POA: Diagnosis not present

## 2020-08-14 ENCOUNTER — Encounter: Payer: Self-pay | Admitting: Internal Medicine

## 2020-08-14 ENCOUNTER — Other Ambulatory Visit: Payer: Self-pay | Admitting: Internal Medicine

## 2020-08-14 DIAGNOSIS — M170 Bilateral primary osteoarthritis of knee: Secondary | ICD-10-CM

## 2020-08-14 MED ORDER — TRAMADOL HCL 50 MG PO TABS: 100.0000 mg | ORAL_TABLET | Freq: Four times a day (QID) | ORAL | 2 refills | Status: DC | PRN

## 2020-08-15 ENCOUNTER — Other Ambulatory Visit: Payer: Self-pay | Admitting: Orthopedic Surgery

## 2020-08-15 DIAGNOSIS — M1711 Unilateral primary osteoarthritis, right knee: Secondary | ICD-10-CM

## 2020-08-15 DIAGNOSIS — M2391 Unspecified internal derangement of right knee: Secondary | ICD-10-CM

## 2020-08-15 DIAGNOSIS — E039 Hypothyroidism, unspecified: Secondary | ICD-10-CM | POA: Diagnosis not present

## 2020-09-04 ENCOUNTER — Other Ambulatory Visit: Payer: Self-pay | Admitting: Internal Medicine

## 2020-09-04 ENCOUNTER — Encounter: Payer: Self-pay | Admitting: Internal Medicine

## 2020-09-04 DIAGNOSIS — E118 Type 2 diabetes mellitus with unspecified complications: Secondary | ICD-10-CM

## 2020-09-04 DIAGNOSIS — I10 Essential (primary) hypertension: Secondary | ICD-10-CM

## 2020-09-04 MED ORDER — METFORMIN HCL ER 500 MG PO TB24: 1000.0000 mg | ORAL_TABLET | Freq: Two times a day (BID) | ORAL | 3 refills | Status: DC

## 2020-09-04 MED ORDER — LISINOPRIL-HYDROCHLOROTHIAZIDE 10-12.5 MG PO TABS: 1.0000 | ORAL_TABLET | Freq: Every day | ORAL | 1 refills | Status: DC

## 2020-09-07 ENCOUNTER — Ambulatory Visit: Payer: Medicare Other | Admitting: Internal Medicine

## 2020-09-13 ENCOUNTER — Other Ambulatory Visit: Payer: Self-pay

## 2020-09-13 ENCOUNTER — Ambulatory Visit (INDEPENDENT_AMBULATORY_CARE_PROVIDER_SITE_OTHER): Payer: Medicare Other | Admitting: Internal Medicine

## 2020-09-13 ENCOUNTER — Encounter: Payer: Self-pay | Admitting: Internal Medicine

## 2020-09-13 VITALS — BP 100/78 | HR 74 | Temp 97.8°F | Ht 68.0 in | Wt 248.0 lb

## 2020-09-13 DIAGNOSIS — I700 Atherosclerosis of aorta: Secondary | ICD-10-CM | POA: Diagnosis not present

## 2020-09-13 DIAGNOSIS — E278 Other specified disorders of adrenal gland: Secondary | ICD-10-CM | POA: Diagnosis not present

## 2020-09-13 DIAGNOSIS — Z1231 Encounter for screening mammogram for malignant neoplasm of breast: Secondary | ICD-10-CM | POA: Diagnosis not present

## 2020-09-13 DIAGNOSIS — E118 Type 2 diabetes mellitus with unspecified complications: Secondary | ICD-10-CM | POA: Diagnosis not present

## 2020-09-13 DIAGNOSIS — I10 Essential (primary) hypertension: Secondary | ICD-10-CM

## 2020-09-13 DIAGNOSIS — N1831 Chronic kidney disease, stage 3a: Secondary | ICD-10-CM | POA: Insufficient documentation

## 2020-09-13 LAB — POCT GLYCOSYLATED HEMOGLOBIN (HGB A1C): Hemoglobin A1C: 5.9 % — AB (ref 4.0–5.6)

## 2020-09-13 NOTE — Progress Notes (Signed)
Date:  09/13/2020   Name:  Kendra Walton   DOB:  September 06, 1950   MRN:  [id number]   Chief Complaint: Diabetes (POCT A1C. BS was 130 this morning. ) and Hypertension  Diabetes She presents for her follow-up diabetic visit. She has type 2 diabetes mellitus. Her disease course has been stable. Pertinent negatives for hypoglycemia include no headaches or tremors. Pertinent negatives for diabetes include no chest pain, no fatigue, no polydipsia and no polyuria. Current diabetic treatment includes oral agent (monotherapy) (metformin). She is compliant with treatment all of the time. She monitors blood glucose at home 1-2 x per day. Her breakfast blood glucose is taken between 7-8 am. Her breakfast blood glucose range is generally 110-130 mg/dl. An ACE inhibitor/angiotensin II receptor blocker is being taken.  Hypertension This is a chronic problem. The problem is controlled. Pertinent negatives include no chest pain, headaches, palpitations or shortness of breath. Past treatments include ACE inhibitors and diuretics. Hypertensive end-organ damage includes kidney disease.    Lab Results  Component Value Date   CREATININE 1.01 (H) 05/09/2020   BUN 16 05/09/2020   NA 142 05/09/2020   K 5.4 (H) 05/09/2020   CL 101 05/09/2020   CO2 27 05/09/2020   Lab Results  Component Value Date   CHOL 145 01/04/2020   HDL 45 01/04/2020   LDLCALC 68 01/04/2020   TRIG 193 (H) 01/04/2020   CHOLHDL 3.2 01/04/2020   Lab Results  Component Value Date   TSH 7.050 (H) 01/04/2020   Lab Results  Component Value Date   HGBA1C 6.3 (H) 05/09/2020   Lab Results  Component Value Date   WBC 12.1 (H) 01/04/2020   HGB 10.8 (L) 01/04/2020   HCT 33.2 (L) 01/04/2020   MCV 83 01/04/2020   PLT 294 01/04/2020   Lab Results  Component Value Date   ALT 26 01/04/2020   AST 23 01/04/2020   ALKPHOS 81 01/04/2020   BILITOT 0.3 01/04/2020     Review of Systems  Constitutional: Negative for appetite  change, fatigue, fever and unexpected weight change.  HENT: Negative for tinnitus and trouble swallowing.   Eyes: Negative for visual disturbance.  Respiratory: Negative for cough, chest tightness and shortness of breath.   Cardiovascular: Negative for chest pain, palpitations and leg swelling.  Gastrointestinal: Negative for abdominal pain.  Endocrine: Negative for polydipsia and polyuria.  Genitourinary: Negative for dysuria and hematuria.  Musculoskeletal: Negative for arthralgias.  Neurological: Negative for tremors, numbness and headaches.  Psychiatric/Behavioral: Negative for dysphoric mood.    Patient Active Problem List   Diagnosis Date Noted  . Stage 3a chronic kidney disease ([place]) 09/13/2020  . Aortic atherosclerosis ([place]) 01/03/2020  . 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 Adrenal nodule ([place]) 09/02/2018  . Obesity (BMI 30-39.9) 07/09/2018  . Tingling of left upper extremity 04/29/2018  . Hearing decreased 12/13/2015  . Hyperlipidemia associated with type 2 diabetes mellitus ([place]) 09/14/2015  . History of breast cancer in female 04/07/2015  . Acquired hypothyroidism 10/04/2014  . Diabetic peripheral neuropathy ([place]) 10/04/2014  . Essential (primary) hypertension 10/04/2014  . Arthritis of knee, degenerative 10/04/2014  . Type II diabetes mellitus with complication ([place]) 63/89/3734    Allergies  Allergen Reactions  . Shellfish Allergy Hives and Itching    Past Surgical History:  Procedure Laterality Date  . CESAREAN SECTION    . CHOLECYSTECTOMY  2005  . ECTOPIC PREGNANCY SURGERY    . MASTECTOMY Right 2010  partial  . REDUCTION MAMMAPLASTY Left 2011  . RIGHT/LEFT HEART CATH AND CORONARY ANGIOGRAPHY Bilateral 08/06/2018   Procedure: RIGHT/LEFT HEART CATH AND CORONARY ANGIOGRAPHY;  Surgeon: Teodoro Spray, MD;  Location: [facility];  Service: Cardiovascular;  Laterality: Bilateral;  . TRANSCATHETER AORTIC VALVE REPLACEMENT,  TRANSAPICAL  08/18/2018   [facility]    Social History   Tobacco Use  . Smoking status: Former Smoker    Years: 40.00    Types: Cigarettes    Quit date: 06/18/2012    Years since quitting: 8.2  . Smokeless tobacco: Never Used  Vaping Use  . Vaping Use: Never used  Substance Use Topics  . Alcohol use: No    Alcohol/week: 0.0 standard drinks  . Drug use: No     Medication list has been reviewed and updated.  Current Meds  Medication Sig  . aspirin EC 81 MG tablet Take 81 mg by mouth daily.  Marland Kitchen atorvastatin (LIPITOR) 20 MG tablet TAKE 1 TABLET(20 MG) BY MOUTH DAILY  . glucose blood (ONE TOUCH ULTRA TEST) test strip Use to test BS twice a day  . hydrocortisone 2.5 % cream Apply topically.  Marland Kitchen ibuprofen (ADVIL,MOTRIN) 200 MG tablet Take 600 mg by mouth every 6 (six) hours as needed for headache or moderate pain.  Marland Kitchen ketoconazole (NIZORAL) 2 % cream Apply 1 application topically daily as needed.  . Lancets (ONETOUCH DELICA PLUS [id number]) MISC USE 1 EACH TWICE DAILY  . levothyroxine (SYNTHROID) 150 MCG tablet Take 150 mcg by mouth daily.  Marland Kitchen lisinopril-hydrochlorothiazide (ZESTORETIC) 10-12.5 MG tablet Take 1 tablet by mouth daily.  . meloxicam (MOBIC) 15 MG tablet Take 15 mg by mouth daily.  . metFORMIN (GLUCOPHAGE-XR) 500 MG 24 hr tablet Take 2 tablets (1,000 mg total) by mouth 2 (two) times daily.  . Multiple Vitamin (MULTIVITAMIN WITH MINERALS) TABS tablet Take 1 tablet by mouth daily.  . Polyvinyl Alcohol-Povidone (REFRESH OP) Place 1 drop into both eyes daily as needed (dry eyes).  . pregabalin (LYRICA) 50 MG capsule Take 1 capsule (50 mg total) by mouth every 6 (six) hours.  . Pumpkin Seed-Soy Germ (AZO BLADDER CONTROL/GO-LESS) CAPS Take 1 capsule by mouth 2 (two) times daily.  . traMADol (ULTRAM) 50 MG tablet Take 2 tablets (100 mg total) by mouth every 6 (six) hours as needed.    PHQ 2/9 Scores 09/13/2020 05/09/2020 01/04/2020 08/25/2019  PHQ - 2 Score 0 0 0 0  PHQ- 9 Score 0 0 0 -     GAD 7 : Generalized Anxiety Score 09/13/2020 05/09/2020 01/04/2020  Nervous, Anxious, on Edge 0 0 1  Control/stop worrying 0 0 1  Worry too much - different things 0 0 1  Trouble relaxing 0 0 0  Restless 0 0 0  Easily annoyed or irritable 0 0 1  Afraid - awful might happen 0 0 0  Total GAD 7 Score 0 0 4  Anxiety Difficulty Not difficult at all - Not difficult at all    BP Readings from Last 3 Encounters:  09/13/20 100/78  05/09/20 124/60  01/04/20 110/78    Physical Exam Vitals and nursing note reviewed.  Constitutional:      General: She is not in acute distress.    Appearance: She is well-developed.  HENT:     Head: Normocephalic and atraumatic.  Cardiovascular:     Rate and Rhythm: Normal rate and regular rhythm.     Pulses: Normal pulses.     Heart sounds: No murmur  heard.   Pulmonary:     Effort: Pulmonary effort is normal. No respiratory distress.     Breath sounds: No wheezing or rhonchi.  Musculoskeletal:     Cervical back: Normal range of motion.     Right lower leg: No edema.     Left lower leg: No edema.  Lymphadenopathy:     Cervical: No cervical adenopathy.  Skin:    General: Skin is warm and dry.     Capillary Refill: Capillary refill takes less than 2 seconds.     Findings: No rash.  Neurological:     General: No focal deficit present.     Mental Status: She is alert and oriented to person, place, and time.  Psychiatric:        Mood and Affect: Mood normal.        Behavior: Behavior normal.     Wt Readings from Last 3 Encounters:  09/13/20 248 lb (112.5 kg)  05/09/20 254 lb (115.2 kg)  01/04/20 257 lb (116.6 kg)    BP 100/78   Pulse 74   Temp 97.8 F (36.6 C) (Oral)   Ht 5\' 8"  (1.727 m)   Wt 248 lb (112.5 kg)   SpO2 97%   BMI 37.71 kg/m   Assessment and Plan: 1. Type II diabetes mellitus with complication (HCC) Clinically stable by exam and report without s/s of hypoglycemia. DM complicated by HTN. Tolerating medications well  without side effects or other concerns. She is continuing to lose weight. - POCT glycosylated hemoglobin (Hb A1C) = 5.9  2. Essential (primary) hypertension Clinically stable exam with well controlled BP. Tolerating medications without side effects at this time. Pt to continue current regimen and low sodium diet; benefits of regular exercise as able discussed.  3. Stage 3a chronic kidney disease (HCC) Stable over the past 9 months Continue to monitor; avoid regular doses of NSAIDS  4. Encounter for screening mammogram for breast cancer - MM 3D SCREEN BREAST UNI LEFT; Future  5. Aortic atherosclerosis (HCC) On statin therapy and ASA  6. Adrenal nodule [facility]) Being followed by Endocrinology Also monitoring thyroid disease   Partially dictated using Dragon software. Any errors are unintentional.  Halina Maidens, MD [facility]  09/13/2020

## 2020-09-18 ENCOUNTER — Other Ambulatory Visit: Payer: Self-pay | Admitting: Internal Medicine

## 2020-09-18 NOTE — Telephone Encounter (Signed)
Requested Prescriptions  Pending Prescriptions Disp Refills  . atorvastatin (LIPITOR) 20 MG tablet [Pharmacy Med Name: ATORVASTATIN 20MG  TABLETS] 90 tablet 1    Sig: TAKE 1 TABLET(20 MG) BY MOUTH DAILY     Cardiovascular:  Antilipid - Statins Failed - 09/18/2020 12:20 PM      Failed - LDL in normal range and within 360 days    LDL Chol Calc (NIH)  Date Value Ref Range Status  01/04/2020 68 0 - 99 mg/dL Final         Failed - Triglycerides in normal range and within 360 days    Triglycerides  Date Value Ref Range Status  01/04/2020 193 (H) 0 - 149 mg/dL Final         Passed - Total Cholesterol in normal range and within 360 days    Cholesterol, Total  Date Value Ref Range Status  01/04/2020 145 100 - 199 mg/dL Final         Passed - HDL in normal range and within 360 days    HDL  Date Value Ref Range Status  01/04/2020 45 >39 mg/dL Final         Passed - Patient is not pregnant      Passed - Valid encounter within last 12 months    Recent Outpatient Visits          5 days ago Type II diabetes mellitus with complication [facility]   [facility] Glean Hess, MD   4 months ago Type II diabetes mellitus with complication [facility])   [facility] Glean Hess, MD   8 months ago Annual physical exam   [facility] Glean Hess, MD   1 year ago Type II diabetes mellitus with complication [facility])   [facility] Glean Hess, MD   1 year ago Type II diabetes mellitus with complication [facility])   [facility] Glean Hess, MD      Future Appointments            In 5 months Army Melia Jesse Sans, MD [facility], [facility]

## 2020-09-29 ENCOUNTER — Other Ambulatory Visit: Payer: Self-pay | Admitting: Internal Medicine

## 2020-09-29 DIAGNOSIS — E118 Type 2 diabetes mellitus with unspecified complications: Secondary | ICD-10-CM

## 2020-11-15 ENCOUNTER — Other Ambulatory Visit: Payer: Self-pay | Admitting: Internal Medicine

## 2020-11-15 ENCOUNTER — Encounter: Payer: Self-pay | Admitting: Internal Medicine

## 2020-11-15 DIAGNOSIS — M170 Bilateral primary osteoarthritis of knee: Secondary | ICD-10-CM

## 2020-11-15 MED ORDER — TRAMADOL HCL 50 MG PO TABS: 100.0000 mg | ORAL_TABLET | Freq: Four times a day (QID) | ORAL | 2 refills | Status: DC | PRN

## 2020-11-16 ENCOUNTER — Encounter: Payer: Self-pay | Admitting: Internal Medicine

## 2020-12-06 DIAGNOSIS — E119 Type 2 diabetes mellitus without complications: Secondary | ICD-10-CM | POA: Diagnosis not present

## 2020-12-06 DIAGNOSIS — M1711 Unilateral primary osteoarthritis, right knee: Secondary | ICD-10-CM | POA: Diagnosis not present

## 2021-01-16 ENCOUNTER — Other Ambulatory Visit: Payer: Self-pay | Admitting: Internal Medicine

## 2021-01-16 DIAGNOSIS — E1142 Type 2 diabetes mellitus with diabetic polyneuropathy: Secondary | ICD-10-CM

## 2021-01-16 NOTE — Telephone Encounter (Signed)
Requested medications are due for refill today.  yes  Requested medications are on the active medications list.  yes  Last refill. 07/13/2020  Future visit scheduled.   yes  Notes to clinic.  Medication not delegated.

## 2021-02-06 DIAGNOSIS — E278 Other specified disorders of adrenal gland: Secondary | ICD-10-CM | POA: Diagnosis not present

## 2021-02-06 DIAGNOSIS — E039 Hypothyroidism, unspecified: Secondary | ICD-10-CM | POA: Diagnosis not present

## 2021-02-13 DIAGNOSIS — E039 Hypothyroidism, unspecified: Secondary | ICD-10-CM | POA: Diagnosis not present

## 2021-02-13 DIAGNOSIS — E278 Other specified disorders of adrenal gland: Secondary | ICD-10-CM | POA: Diagnosis not present

## 2021-02-15 ENCOUNTER — Other Ambulatory Visit: Payer: Self-pay

## 2021-02-15 ENCOUNTER — Ambulatory Visit (INDEPENDENT_AMBULATORY_CARE_PROVIDER_SITE_OTHER): Payer: Medicare Other | Admitting: Internal Medicine

## 2021-02-15 ENCOUNTER — Other Ambulatory Visit: Payer: Self-pay | Admitting: Internal Medicine

## 2021-02-15 ENCOUNTER — Encounter: Payer: Self-pay | Admitting: Internal Medicine

## 2021-02-15 VITALS — BP 92/52 | HR 62 | Temp 97.9°F | Ht 68.0 in | Wt 242.0 lb

## 2021-02-15 DIAGNOSIS — E039 Hypothyroidism, unspecified: Secondary | ICD-10-CM | POA: Diagnosis not present

## 2021-02-15 DIAGNOSIS — E1169 Type 2 diabetes mellitus with other specified complication: Secondary | ICD-10-CM | POA: Diagnosis not present

## 2021-02-15 DIAGNOSIS — Z0000 Encounter for general adult medical examination without abnormal findings: Secondary | ICD-10-CM

## 2021-02-15 DIAGNOSIS — N1831 Chronic kidney disease, stage 3a: Secondary | ICD-10-CM

## 2021-02-15 DIAGNOSIS — Z23 Encounter for immunization: Secondary | ICD-10-CM | POA: Diagnosis not present

## 2021-02-15 DIAGNOSIS — E118 Type 2 diabetes mellitus with unspecified complications: Secondary | ICD-10-CM | POA: Diagnosis not present

## 2021-02-15 DIAGNOSIS — I10 Essential (primary) hypertension: Secondary | ICD-10-CM | POA: Diagnosis not present

## 2021-02-15 DIAGNOSIS — E785 Hyperlipidemia, unspecified: Secondary | ICD-10-CM

## 2021-02-15 DIAGNOSIS — E1142 Type 2 diabetes mellitus with diabetic polyneuropathy: Secondary | ICD-10-CM

## 2021-02-15 DIAGNOSIS — Z1231 Encounter for screening mammogram for malignant neoplasm of breast: Secondary | ICD-10-CM

## 2021-02-15 LAB — POCT URINALYSIS DIPSTICK
Bilirubin, UA: NEGATIVE
Blood, UA: NEGATIVE
Glucose, UA: NEGATIVE
Ketones, UA: NEGATIVE
Leukocytes, UA: NEGATIVE
Nitrite, UA: NEGATIVE
Protein, UA: NEGATIVE
Spec Grav, UA: 1.025 (ref 1.010–1.025)
Urobilinogen, UA: 0.2 E.U./dL
pH, UA: 5 (ref 5.0–8.0)

## 2021-02-15 MED ORDER — PREGABALIN 50 MG PO CAPS: 50.0000 mg | ORAL_CAPSULE | Freq: Four times a day (QID) | ORAL | 1 refills | Status: DC

## 2021-02-15 MED ORDER — SHINGRIX 50 MCG/0.5ML IM SUSR: 0.5000 mL | Freq: Once | INTRAMUSCULAR | 1 refills | Status: AC

## 2021-02-15 NOTE — Progress Notes (Signed)
Date:  02/15/2021   Name:  Kendra Walton   DOB:  1950-08-08   MRN:  [id number]   Chief Complaint: Annual Exam (Breast exam no pap) Kendra Walton is a 70 y.o. female who presents today for her Complete Annual Exam. She feels well. She reports exercising none. She reports she is sleeping well. Breast complaints none.  Mammogram: 07/2019 DEXA: none Colonoscopy: declined  Immunization History  Administered Date(s) Administered   Fluad Quad(high Dose 65+) 05/07/2019, 05/09/2020   Influenza, High Dose Seasonal PF 04/29/2018   Influenza,inj,Quad PF,6+ Mos 04/07/2015, 04/13/2016, 07/24/2017   Influenza-Unspecified 05/19/2014   Moderna Sars-Covid-2 Vaccination 06/23/2020   PFIZER(Purple Top)SARS-COV-2 Vaccination 08/16/2019, 09/07/2019, 06/15/2020   Pneumococcal Conjugate-13 08/08/2015   Pneumococcal Polysaccharide-23 12/25/2017    Hypertension This is a chronic problem. The problem is controlled. Pertinent negatives include no chest pain, headaches, palpitations or shortness of breath. Past treatments include ACE inhibitors and diuretics. Identifiable causes of hypertension include a thyroid problem.  Diabetes She presents for her follow-up diabetic visit. She has type 2 diabetes mellitus. Her disease course has been stable. Pertinent negatives for hypoglycemia include no dizziness, headaches, nervousness/anxiousness or tremors. Pertinent negatives for diabetes include no chest pain, no fatigue, no polydipsia and no polyuria. Diabetic complications include peripheral neuropathy. Current diabetic treatment includes oral agent (monotherapy). She is compliant with treatment all of the time. Her breakfast blood glucose is taken between 6-7 am. Her breakfast blood glucose range is generally 130-140 mg/dl. An ACE inhibitor/angiotensin II receptor blocker is being taken. Eye exam is current.  Thyroid Problem Presents for follow-up visit. Patient reports no anxiety, constipation,  diarrhea, fatigue, palpitations or tremors. The symptoms have been stable. Her past medical history is significant for hyperlipidemia.  Hyperlipidemia This is a chronic problem. The problem is controlled. Pertinent negatives include no chest pain or shortness of breath. Current antihyperlipidemic treatment includes statins. The current treatment provides significant improvement of lipids.   Lab Results  Component Value Date   CREATININE 1.01 (H) 05/09/2020   BUN 16 05/09/2020   NA 142 05/09/2020   K 5.4 (H) 05/09/2020   CL 101 05/09/2020   CO2 27 05/09/2020   Lab Results  Component Value Date   CHOL 145 01/04/2020   HDL 45 01/04/2020   LDLCALC 68 01/04/2020   TRIG 193 (H) 01/04/2020   CHOLHDL 3.2 01/04/2020   Lab Results  Component Value Date   TSH 7.050 (H) 01/04/2020   Lab Results  Component Value Date   HGBA1C 5.9 (A) 09/13/2020   Lab Results  Component Value Date   WBC 12.1 (H) 01/04/2020   HGB 10.8 (L) 01/04/2020   HCT 33.2 (L) 01/04/2020   MCV 83 01/04/2020   PLT 294 01/04/2020   Lab Results  Component Value Date   ALT 26 01/04/2020   AST 23 01/04/2020   ALKPHOS 81 01/04/2020   BILITOT 0.3 01/04/2020     Review of Systems  Constitutional:  Negative for chills, fatigue and fever.  HENT:  Negative for congestion, hearing loss, tinnitus, trouble swallowing and voice change.   Eyes:  Negative for visual disturbance.  Respiratory:  Negative for cough, chest tightness, shortness of breath and wheezing.   Cardiovascular:  Negative for chest pain, palpitations and leg swelling.  Gastrointestinal:  Negative for abdominal pain, constipation, diarrhea and vomiting.  Endocrine: Negative for polydipsia and polyuria.  Genitourinary:  Negative for dysuria, frequency, genital sores, vaginal bleeding and vaginal discharge.  Musculoskeletal:  Negative for  arthralgias, gait problem and joint swelling.  Skin:  Negative for color change and rash.  Neurological:  Negative for  dizziness, tremors, light-headedness and headaches.       Peripheral neuropathy 2/2 DM  Hematological:  Negative for adenopathy. Does not bruise/bleed easily.  Psychiatric/Behavioral:  Negative for dysphoric mood and sleep disturbance. The patient is not nervous/anxious.    Patient Active Problem List   Diagnosis Date Noted   Stage 3a chronic kidney disease ([place]) 09/13/2020   Aortic atherosclerosis ([place]) 01/03/2020   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   Adrenal nodule ([place]) 09/02/2018   Obesity (BMI 30-39.9) 07/09/2018   Tingling of left upper extremity 04/29/2018   Hearing decreased 12/13/2015   Hyperlipidemia associated with type 2 diabetes mellitus ([place]) 09/14/2015   History of breast cancer in female 04/07/2015   Acquired hypothyroidism 10/04/2014   Diabetic peripheral neuropathy ([place]) 10/04/2014   Essential (primary) hypertension 10/04/2014   Arthritis of knee, degenerative 10/04/2014   Type II diabetes mellitus with complication ([place]) [id number]    Allergies  Allergen Reactions   Shellfish Allergy Hives and Itching    Past Surgical History:  Procedure Laterality Date   CESAREAN SECTION     CHOLECYSTECTOMY  2005   ECTOPIC PREGNANCY SURGERY     MASTECTOMY Right 2010   partial   REDUCTION MAMMAPLASTY Left 2011   RIGHT/LEFT HEART CATH AND CORONARY ANGIOGRAPHY Bilateral 08/06/2018   Procedure: RIGHT/LEFT HEART CATH AND CORONARY ANGIOGRAPHY;  Surgeon: Teodoro Spray, MD;  Location: [facility];  Service: Cardiovascular;  Laterality: Bilateral;   TRANSCATHETER AORTIC VALVE REPLACEMENT, TRANSAPICAL  08/18/2018   [facility]    Social History   Tobacco Use   Smoking status: Former    Years: 40.00    Types: Cigarettes    Quit date: 06/18/2012    Years since quitting: 8.6   Smokeless tobacco: Never  Vaping Use   Vaping Use: Never used  Substance Use Topics   Alcohol use: No    Alcohol/week: 0.0 standard drinks   Drug use: No      Medication list has been reviewed and updated.  Current Meds  Medication Sig   aspirin EC 81 MG tablet Take 81 mg by mouth daily.   atorvastatin (LIPITOR) 20 MG tablet TAKE 1 TABLET(20 MG) BY MOUTH DAILY   glucose blood (ONE TOUCH ULTRA TEST) test strip Use to test BS twice a day   hydrocortisone 2.5 % cream Apply topically.   ibuprofen (ADVIL,MOTRIN) 200 MG tablet Take 600 mg by mouth every 6 (six) hours as needed for headache or moderate pain.   ketoconazole (NIZORAL) 2 % cream Apply 1 application topically daily as needed.   Lancets (ONETOUCH DELICA PLUS 123XX123) MISC USE 1 EACH TWICE DAILY   levothyroxine (SYNTHROID) 150 MCG tablet Take 150 mcg by mouth daily.   lisinopril-hydrochlorothiazide (ZESTORETIC) 10-12.5 MG tablet Take 1 tablet by mouth daily.   meloxicam (MOBIC) 15 MG tablet Take 15 mg by mouth daily.   metFORMIN (GLUCOPHAGE-XR) 500 MG 24 hr tablet Take 2 tablets (1,000 mg total) by mouth 2 (two) times daily.   Multiple Vitamin (MULTIVITAMIN WITH MINERALS) TABS tablet Take 1 tablet by mouth daily.   Polyvinyl Alcohol-Povidone (REFRESH OP) Place 1 drop into both eyes daily as needed (dry eyes).   pregabalin (LYRICA) 50 MG capsule TAKE 1 CAPSULE BY MOUTH EVERY 6 HOURS   Pumpkin Seed-Soy Germ (AZO BLADDER CONTROL/GO-LESS) CAPS Take 1 capsule by mouth 2 (two) times daily.  traMADol (ULTRAM) 50 MG tablet Take 2 tablets (100 mg total) by mouth every 6 (six) hours as needed.    PHQ 2/9 Scores 02/15/2021 09/13/2020 05/09/2020 01/04/2020  PHQ - 2 Score 0 0 0 0  PHQ- 9 Score 2 0 0 0    GAD 7 : Generalized Anxiety Score 02/15/2021 09/13/2020 05/09/2020 01/04/2020  Nervous, Anxious, on Edge 0 0 0 1  Control/stop worrying 0 0 0 1  Worry too much - different things 0 0 0 1  Trouble relaxing 0 0 0 0  Restless 0 0 0 0  Easily annoyed or irritable 0 0 0 1  Afraid - awful might happen 0 0 0 0  Total GAD 7 Score 0 0 0 4  Anxiety Difficulty - Not difficult at all - Not difficult at  all    BP Readings from Last 3 Encounters:  02/15/21 (!) 92/52  09/13/20 100/78  05/09/20 124/60    Physical Exam Vitals and nursing note reviewed.  Constitutional:      General: She is not in acute distress.    Appearance: She is well-developed.  HENT:     Head: Normocephalic and atraumatic.     Right Ear: Tympanic membrane and ear canal normal.     Left Ear: Tympanic membrane and ear canal normal.     Nose:     Right Sinus: No maxillary sinus tenderness.     Left Sinus: No maxillary sinus tenderness.  Eyes:     General: No scleral icterus.       Right eye: No discharge.        Left eye: No discharge.     Conjunctiva/sclera: Conjunctivae normal.  Neck:     Thyroid: No thyromegaly.     Vascular: No carotid bruit.  Cardiovascular:     Rate and Rhythm: Normal rate and regular rhythm.     Pulses: Normal pulses.     Heart sounds: Normal heart sounds.  Pulmonary:     Effort: Pulmonary effort is normal. No respiratory distress.     Breath sounds: No wheezing.  Chest:  Breasts:    Right: Absent. No mass, skin change or tenderness.     Left: No mass, nipple discharge, skin change or tenderness.     Comments: Right breast reconstruction with soft implant  Left breast reduction scars healed Abdominal:     General: Bowel sounds are normal.     Palpations: Abdomen is soft.     Tenderness: There is no abdominal tenderness.  Musculoskeletal:     Cervical back: Normal range of motion. No erythema.     Right lower leg: No edema.     Left lower leg: No edema.  Lymphadenopathy:     Cervical: No cervical adenopathy.  Skin:    General: Skin is warm and dry.     Findings: No rash.  Neurological:     Mental Status: She is alert and oriented to person, place, and time. Mental status is at baseline.     Cranial Nerves: No cranial nerve deficit.     Sensory: Sensory deficit present.     Motor: Motor function is intact.     Deep Tendon Reflexes: Reflexes are normal and symmetric.   Psychiatric:        Attention and Perception: Attention normal.        Mood and Affect: Mood normal.    Wt Readings from Last 3 Encounters:  02/15/21 242 lb (109.8 kg)  09/13/20 248 lb (112.5 kg)  05/09/20 254 lb (115.2 kg)    BP (!) 92/52   Pulse 62   Temp 97.9 F (36.6 C) (Oral)   Ht '5\' 8"'$  (1.727 m)   Wt 242 lb (109.8 kg)   SpO2 97%   BMI 36.80 kg/m   Assessment and Plan: 1. Annual physical exam Exam is normal except for weight. Encourage regular exercise and appropriate dietary changes. She is slowly losing with diet changes.  2. Encounter for screening mammogram for breast cancer Will schedule [facility] at [facility].  3. Essential (primary) hypertension Clinically stable exam with well controlled BP. Tolerating medications without side effects at this time. Pt to continue current regimen and low sodium diet; benefits of regular exercise as able discussed. - CBC with Differential/Platelet - POCT urinalysis dipstick  4. Acquired hypothyroidism Followed by Endocrinology.  5. Type II diabetes mellitus with complication ([place]) Clinically stable by exam and report without s/s of hypoglycemia. DM complicated by hypertension and dyslipidemia. Tolerating medications well without side effects or other concerns. - Comprehensive metabolic panel - Hemoglobin A1c  6. Hyperlipidemia associated with type 2 diabetes mellitus ([place]) Tolerating statin medication without side effects at this time LDL is at goal of < 70 on current dose of atorvastatin 20 mg. Continue same therapy without change at this time. - Lipid panel  7. Stage 3a chronic kidney disease (HCC) Continue to monitor - Comprehensive metabolic panel  8. Need for shingles vaccine She will get this through her pharmacy. - Zoster Vaccine Adjuvanted [facility]) injection; Inject 0.5 mLs into the muscle once for 1 dose.  Dispense: 0.5 mL; Refill: 1  9. Diabetic polyneuropathy associated with type 2 diabetes mellitus  ([place]) Doing well with current dose of Lyrica qid. - pregabalin (LYRICA) 50 MG capsule; Take 1 capsule (50 mg total) by mouth 4 (four) times daily.  Dispense: 360 capsule; Refill: 1   Partially dictated using Editor, commissioning. Any errors are unintentional.  Halina Maidens, MD [facility]  02/15/2021

## 2021-02-16 LAB — COMPREHENSIVE METABOLIC PANEL
ALT: 14 IU/L (ref 0–32)
AST: 12 IU/L (ref 0–40)
Albumin/Globulin Ratio: 1.4 (ref 1.2–2.2)
Albumin: 4.3 g/dL (ref 3.7–4.7)
Alkaline Phosphatase: 88 IU/L (ref 44–121)
BUN/Creatinine Ratio: 21 (ref 12–28)
BUN: 33 mg/dL — ABNORMAL HIGH (ref 8–27)
Bilirubin Total: 0.2 mg/dL (ref 0.0–1.2)
CO2: 22 mmol/L (ref 20–29)
Calcium: 9.6 mg/dL (ref 8.7–10.3)
Chloride: 102 mmol/L (ref 96–106)
Creatinine, Ser: 1.55 mg/dL — ABNORMAL HIGH (ref 0.57–1.00)
Globulin, Total: 3 g/dL (ref 1.5–4.5)
Glucose: 93 mg/dL (ref 65–99)
Potassium: 5.9 mmol/L — ABNORMAL HIGH (ref 3.5–5.2)
Sodium: 138 mmol/L (ref 134–144)
Total Protein: 7.3 g/dL (ref 6.0–8.5)
eGFR: 35 mL/min/{1.73_m2} — ABNORMAL LOW (ref >59)

## 2021-02-16 LAB — HEMOGLOBIN A1C
Est. average glucose Bld gHb Est-mCnc: 120 mg/dL
Hgb A1c MFr Bld: 5.8 % — ABNORMAL HIGH (ref 4.8–5.6)

## 2021-02-16 LAB — LIPID PANEL
Chol/HDL Ratio: 3.7 ratio (ref 0.0–4.4)
Cholesterol, Total: 146 mg/dL (ref 100–199)
HDL: 39 mg/dL — ABNORMAL LOW (ref >39)
LDL Chol Calc (NIH): 72 mg/dL (ref 0–99)
Triglycerides: 212 mg/dL — ABNORMAL HIGH (ref 0–149)
VLDL Cholesterol Cal: 35 mg/dL (ref 5–40)

## 2021-02-16 LAB — CBC WITH DIFFERENTIAL/PLATELET
Basophils Absolute: 0.1 10*3/uL (ref 0.0–0.2)
Basos: 1 %
EOS (ABSOLUTE): 0.4 10*3/uL (ref 0.0–0.4)
Eos: 3 %
Hematocrit: 31.9 % — ABNORMAL LOW (ref 34.0–46.6)
Hemoglobin: 10.1 g/dL — ABNORMAL LOW (ref 11.1–15.9)
Immature Grans (Abs): 0 10*3/uL (ref 0.0–0.1)
Immature Granulocytes: 0 %
Lymphocytes Absolute: 3.3 10*3/uL — ABNORMAL HIGH (ref 0.7–3.1)
Lymphs: 26 %
MCH: 26.2 pg — ABNORMAL LOW (ref 26.6–33.0)
MCHC: 31.7 g/dL (ref 31.5–35.7)
MCV: 83 fL (ref 79–97)
Monocytes Absolute: 0.8 10*3/uL (ref 0.1–0.9)
Monocytes: 6 %
Neutrophils Absolute: 8.1 10*3/uL — ABNORMAL HIGH (ref 1.4–7.0)
Neutrophils: 64 %
Platelets: 286 10*3/uL (ref 150–450)
RBC: 3.85 x10E6/uL (ref 3.77–5.28)
RDW: 13.4 % (ref 11.7–15.4)
WBC: 12.8 10*3/uL — ABNORMAL HIGH (ref 3.4–10.8)

## 2021-02-17 ENCOUNTER — Other Ambulatory Visit: Payer: Self-pay | Admitting: Internal Medicine

## 2021-02-17 DIAGNOSIS — N1831 Chronic kidney disease, stage 3a: Secondary | ICD-10-CM

## 2021-02-18 ENCOUNTER — Other Ambulatory Visit: Payer: Self-pay | Admitting: Internal Medicine

## 2021-02-18 DIAGNOSIS — M170 Bilateral primary osteoarthritis of knee: Secondary | ICD-10-CM

## 2021-02-18 NOTE — Telephone Encounter (Signed)
Requested medication (s) are due for refill today: yes  Requested medication (s) are on the active medication list: yes  Last refill:  11/15/20  Future visit scheduled: yes  Notes to clinic:  med not delegated to NT to RF   Requested Prescriptions  Pending Prescriptions Disp Refills   traMADol (ULTRAM) 50 MG tablet [Pharmacy Med Name: TRAMADOL '50MG'$  TABLETS] 240 tablet     Sig: TAKE 2 TABLETS(100 MG) BY MOUTH EVERY 6 HOURS AS NEEDED     Not Delegated - Analgesics:  Opioid Agonists Failed - 02/18/2021 11:03 AM      Failed - This refill cannot be delegated      Failed - Urine Drug Screen completed in last 360 days      Passed - Valid encounter within last 6 months    Recent Outpatient Visits           3 days ago Annual physical exam   [facility] Glean Hess, MD   5 months ago Type II diabetes mellitus with complication [facility])   [facility] Glean Hess, MD   9 months ago Type II diabetes mellitus with complication [facility])   [facility] Glean Hess, MD   1 year ago Annual physical exam   [facility] Glean Hess, MD   1 year ago Type II diabetes mellitus with complication [facility])   [facility] Glean Hess, MD       Future Appointments             In 4 months Army Melia Jesse Sans, MD [facility], [place]   In 1 year Army Melia, Jesse Sans, MD [facility]

## 2021-02-21 NOTE — Telephone Encounter (Signed)
Please review. Pharmacy is saying there are no refills on medication.  KP

## 2021-02-21 NOTE — Telephone Encounter (Signed)
Please review. Last office visit 02/15/2021.  KP

## 2021-02-26 ENCOUNTER — Other Ambulatory Visit: Payer: Self-pay | Admitting: Internal Medicine

## 2021-02-26 DIAGNOSIS — I10 Essential (primary) hypertension: Secondary | ICD-10-CM

## 2021-02-26 MED ORDER — LISINOPRIL-HYDROCHLOROTHIAZIDE 10-12.5 MG PO TABS: 1.0000 | ORAL_TABLET | Freq: Every day | ORAL | 1 refills | Status: DC

## 2021-03-08 ENCOUNTER — Other Ambulatory Visit: Payer: Self-pay | Admitting: Internal Medicine

## 2021-03-08 DIAGNOSIS — Z1231 Encounter for screening mammogram for malignant neoplasm of breast: Secondary | ICD-10-CM

## 2021-03-19 ENCOUNTER — Other Ambulatory Visit: Payer: Self-pay | Admitting: Internal Medicine

## 2021-03-20 ENCOUNTER — Other Ambulatory Visit: Payer: Self-pay

## 2021-03-20 ENCOUNTER — Other Ambulatory Visit: Payer: Medicare Other

## 2021-03-20 DIAGNOSIS — N1831 Chronic kidney disease, stage 3a: Secondary | ICD-10-CM | POA: Diagnosis not present

## 2021-03-21 LAB — BASIC METABOLIC PANEL
BUN/Creatinine Ratio: 18 (ref 12–28)
BUN: 22 mg/dL (ref 8–27)
CO2: 25 mmol/L (ref 20–29)
Calcium: 9.6 mg/dL (ref 8.7–10.3)
Chloride: 103 mmol/L (ref 96–106)
Creatinine, Ser: 1.25 mg/dL — ABNORMAL HIGH (ref 0.57–1.00)
Glucose: 105 mg/dL — ABNORMAL HIGH (ref 70–99)
Potassium: 5.4 mmol/L — ABNORMAL HIGH (ref 3.5–5.2)
Sodium: 143 mmol/L (ref 134–144)
eGFR: 46 mL/min/{1.73_m2} — ABNORMAL LOW (ref >59)

## 2021-03-23 ENCOUNTER — Ambulatory Visit
Admission: RE | Admit: 2021-03-23 | Discharge: 2021-03-23 | Disposition: A | Discharge status: Home / Self Care | Payer: Medicare Other | Source: Ambulatory Visit | Attending: Internal Medicine | Admitting: Internal Medicine

## 2021-03-23 ENCOUNTER — Other Ambulatory Visit: Payer: Self-pay

## 2021-03-23 DIAGNOSIS — Z1231 Encounter for screening mammogram for malignant neoplasm of breast: Secondary | ICD-10-CM | POA: Diagnosis not present

## 2021-04-06 DIAGNOSIS — L308 Other specified dermatitis: Secondary | ICD-10-CM | POA: Diagnosis not present

## 2021-04-06 DIAGNOSIS — L218 Other seborrheic dermatitis: Secondary | ICD-10-CM | POA: Diagnosis not present

## 2021-04-23 ENCOUNTER — Other Ambulatory Visit: Payer: Self-pay | Admitting: Internal Medicine

## 2021-04-23 DIAGNOSIS — M170 Bilateral primary osteoarthritis of knee: Secondary | ICD-10-CM

## 2021-04-23 NOTE — Telephone Encounter (Signed)
Requested medication (s) are due for refill today: no  Requested medication (s) are on the active medication list: yes  Last refill:  03/13/21  Future visit scheduled: yes  Notes to clinic:  med not delegated to NT to RF   Requested Prescriptions  Pending Prescriptions Disp Refills   traMADol (ULTRAM) 50 MG tablet [Pharmacy Med Name: TRAMADOL 50MG  TABLETS] 240 tablet     Sig: TAKE 2 TABLETS(100 MG) BY MOUTH EVERY 6 HOURS AS NEEDED     Not Delegated - Analgesics:  Opioid Agonists Failed - 04/23/2021 10:42 AM      Failed - This refill cannot be delegated      Failed - Urine Drug Screen completed in last 360 days      Passed - Valid encounter within last 6 months    Recent Outpatient Visits           2 months ago Annual physical exam   [facility] Glean Hess, MD   7 months ago [facility] mellitus with [facility]   [facility] Glean Hess, MD   11 months ago Type II diabetes mellitus with complication [facility])   [facility] Glean Hess, MD   1 year ago Annual physical exam   [facility] Glean Hess, MD   1 year ago Type II diabetes mellitus with complication [facility])   [facility] Glean Hess, MD       Future Appointments             In 1 month Army Melia Jesse Sans, MD [facility], [place]   In 10 months Army Melia Jesse Sans, MD [facility], [facility]

## 2021-04-25 DIAGNOSIS — M25561 Pain in right knee: Secondary | ICD-10-CM | POA: Diagnosis not present

## 2021-04-25 DIAGNOSIS — G8929 Other chronic pain: Secondary | ICD-10-CM | POA: Diagnosis not present

## 2021-04-25 DIAGNOSIS — M1712 Unilateral primary osteoarthritis, left knee: Secondary | ICD-10-CM | POA: Diagnosis not present

## 2021-04-25 DIAGNOSIS — M2391 Unspecified internal derangement of right knee: Secondary | ICD-10-CM | POA: Diagnosis not present

## 2021-04-25 DIAGNOSIS — E119 Type 2 diabetes mellitus without complications: Secondary | ICD-10-CM | POA: Diagnosis not present

## 2021-04-25 DIAGNOSIS — M1711 Unilateral primary osteoarthritis, right knee: Secondary | ICD-10-CM | POA: Diagnosis not present

## 2021-04-26 ENCOUNTER — Other Ambulatory Visit: Payer: Self-pay | Admitting: Orthopedic Surgery

## 2021-04-26 DIAGNOSIS — G8929 Other chronic pain: Secondary | ICD-10-CM

## 2021-04-26 DIAGNOSIS — M1711 Unilateral primary osteoarthritis, right knee: Secondary | ICD-10-CM

## 2021-04-26 DIAGNOSIS — M2391 Unspecified internal derangement of right knee: Secondary | ICD-10-CM

## 2021-04-28 ENCOUNTER — Encounter: Payer: Self-pay | Admitting: Internal Medicine

## 2021-04-28 DIAGNOSIS — E1129 Type 2 diabetes mellitus with other diabetic kidney complication: Secondary | ICD-10-CM | POA: Insufficient documentation

## 2021-05-05 ENCOUNTER — Ambulatory Visit: Admission: RE | Admit: 2021-05-05 | Payer: Medicare Other | Source: Ambulatory Visit

## 2021-05-10 ENCOUNTER — Ambulatory Visit
Admission: RE | Admit: 2021-05-10 | Discharge: 2021-05-10 | Disposition: A | Discharge status: Home / Self Care | Payer: Medicare Other | Source: Ambulatory Visit | Attending: Orthopedic Surgery | Admitting: Orthopedic Surgery

## 2021-05-10 ENCOUNTER — Other Ambulatory Visit: Payer: Self-pay

## 2021-05-10 DIAGNOSIS — G8929 Other chronic pain: Secondary | ICD-10-CM | POA: Insufficient documentation

## 2021-05-10 DIAGNOSIS — M25561 Pain in right knee: Secondary | ICD-10-CM | POA: Insufficient documentation

## 2021-05-10 DIAGNOSIS — S83241A Other tear of medial meniscus, current injury, right knee, initial encounter: Secondary | ICD-10-CM | POA: Diagnosis not present

## 2021-05-10 DIAGNOSIS — M2391 Unspecified internal derangement of right knee: Secondary | ICD-10-CM | POA: Diagnosis not present

## 2021-05-10 DIAGNOSIS — M1711 Unilateral primary osteoarthritis, right knee: Secondary | ICD-10-CM | POA: Insufficient documentation

## 2021-05-16 DIAGNOSIS — E039 Hypothyroidism, unspecified: Secondary | ICD-10-CM | POA: Diagnosis not present

## 2021-05-23 DIAGNOSIS — M1711 Unilateral primary osteoarthritis, right knee: Secondary | ICD-10-CM | POA: Diagnosis not present

## 2021-06-16 ENCOUNTER — Other Ambulatory Visit: Payer: Self-pay | Admitting: Orthopedic Surgery

## 2021-06-16 DIAGNOSIS — E118 Type 2 diabetes mellitus with unspecified complications: Secondary | ICD-10-CM | POA: Diagnosis not present

## 2021-06-16 DIAGNOSIS — M1711 Unilateral primary osteoarthritis, right knee: Secondary | ICD-10-CM | POA: Diagnosis not present

## 2021-06-16 DIAGNOSIS — I700 Atherosclerosis of aorta: Secondary | ICD-10-CM | POA: Diagnosis not present

## 2021-06-20 ENCOUNTER — Ambulatory Visit (INDEPENDENT_AMBULATORY_CARE_PROVIDER_SITE_OTHER): Payer: Medicare Other | Admitting: Internal Medicine

## 2021-06-20 ENCOUNTER — Other Ambulatory Visit: Payer: Self-pay

## 2021-06-20 ENCOUNTER — Encounter: Payer: Self-pay | Admitting: Internal Medicine

## 2021-06-20 VITALS — BP 128/78 | HR 72 | Ht 68.0 in | Wt 241.0 lb

## 2021-06-20 DIAGNOSIS — E1122 Type 2 diabetes mellitus with diabetic chronic kidney disease: Secondary | ICD-10-CM | POA: Insufficient documentation

## 2021-06-20 DIAGNOSIS — I700 Atherosclerosis of aorta: Secondary | ICD-10-CM

## 2021-06-20 DIAGNOSIS — N1831 Chronic kidney disease, stage 3a: Secondary | ICD-10-CM | POA: Diagnosis not present

## 2021-06-20 DIAGNOSIS — M170 Bilateral primary osteoarthritis of knee: Secondary | ICD-10-CM

## 2021-06-20 DIAGNOSIS — E118 Type 2 diabetes mellitus with unspecified complications: Secondary | ICD-10-CM | POA: Insufficient documentation

## 2021-06-20 DIAGNOSIS — I10 Essential (primary) hypertension: Secondary | ICD-10-CM | POA: Diagnosis not present

## 2021-06-20 DIAGNOSIS — E278 Other specified disorders of adrenal gland: Secondary | ICD-10-CM | POA: Diagnosis not present

## 2021-06-20 NOTE — Progress Notes (Signed)
Date:  06/20/2021   Name:  Kendra Walton   DOB:  May 12, 1951   MRN:  [id number]   Chief Complaint: Diabetes and Hypertension  Diabetes She presents for her follow-up diabetic visit. She has type 2 diabetes mellitus. Her disease course has been stable. Pertinent negatives for hypoglycemia include no headaches, nervousness/anxiousness or tremors. Pertinent negatives for diabetes include no chest pain, no fatigue, no polydipsia and no polyuria. Current diabetic treatment includes oral agent (monotherapy) (metformin).  Hypertension This is a chronic problem. The problem is controlled. Pertinent negatives include no chest pain, headaches, palpitations or shortness of breath. Past treatments include ACE inhibitors and diuretics. The current treatment provides significant improvement.  Knee Pain  There was no injury mechanism. The pain is present in the right knee. The quality of the pain is described as shooting and aching. The pain is moderate. Associated symptoms include numbness (foot pain).  Planning TKA in the near future with Dr. Rudene Christians.  Lab Results  Component Value Date   NA 143 03/20/2021   K 5.4 (H) 03/20/2021   CO2 25 03/20/2021   GLUCOSE 105 (H) 03/20/2021   BUN 22 03/20/2021   CREATININE 1.25 (H) 03/20/2021   CALCIUM 9.6 03/20/2021   EGFR 46 (L) 03/20/2021   GFRNONAA 56 (L) 05/09/2020   Lab Results  Component Value Date   CHOL 146 02/15/2021   HDL 39 (L) 02/15/2021   LDLCALC 72 02/15/2021   TRIG 212 (H) 02/15/2021   CHOLHDL 3.7 02/15/2021   Lab Results  Component Value Date   TSH 7.050 (H) 01/04/2020   Lab Results  Component Value Date   HGBA1C 5.8 (H) 02/15/2021   Lab Results  Component Value Date   WBC 12.8 (H) 02/15/2021   HGB 10.1 (L) 02/15/2021   HCT 31.9 (L) 02/15/2021   MCV 83 02/15/2021   PLT 286 02/15/2021   Lab Results  Component Value Date   ALT 14 02/15/2021   AST 12 02/15/2021   ALKPHOS 88 02/15/2021   BILITOT 0.2 02/15/2021   No  results found for: 25OHVITD2, 25OHVITD3, VD25OH   Review of Systems  Constitutional:  Negative for appetite change, fatigue, fever and unexpected weight change.  HENT:  Negative for tinnitus and trouble swallowing.   Eyes:  Negative for visual disturbance.  Respiratory:  Negative for cough, chest tightness and shortness of breath.   Cardiovascular:  Negative for chest pain, palpitations and leg swelling.  Gastrointestinal:  Negative for abdominal pain.  Endocrine: Negative for polydipsia and polyuria.  Genitourinary:  Negative for dysuria and hematuria.  Musculoskeletal:  Positive for arthralgias and gait problem.  Neurological:  Positive for numbness (foot pain). Negative for tremors and headaches.  Psychiatric/Behavioral:  Negative for dysphoric mood and sleep disturbance. The patient is not nervous/anxious.    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   Adrenal nodule ([place]) 09/02/2018   Obesity (BMI 30-39.9) 07/09/2018   Hearing decreased 12/13/2015   Hyperlipidemia associated with type 2 diabetes mellitus ([place]) 09/14/2015   History of breast cancer in female 04/07/2015   Acquired hypothyroidism 10/04/2014   Diabetic peripheral neuropathy ([place]) 10/04/2014   Essential (primary) hypertension 10/04/2014   Arthritis of knee, degenerative 10/04/2014   DM (  diabetes mellitus), type 2 with neurological complications ([place]) 30/16/0109    Allergies  Allergen Reactions   Shellfish Allergy Hives and Itching    Past Surgical History:  Procedure Laterality Date   CESAREAN SECTION     CHOLECYSTECTOMY  2005   ECTOPIC PREGNANCY SURGERY     MASTECTOMY Right 2010   partial   REDUCTION MAMMAPLASTY Left 2011    RIGHT/LEFT HEART CATH AND CORONARY ANGIOGRAPHY Bilateral 08/06/2018   Procedure: RIGHT/LEFT HEART CATH AND CORONARY ANGIOGRAPHY;  Surgeon: Teodoro Spray, MD;  Location: [facility];  Service: Cardiovascular;  Laterality: Bilateral;   TRANSCATHETER AORTIC VALVE REPLACEMENT, TRANSAPICAL  08/18/2018   [facility]    Social History   Tobacco Use   Smoking status: Former    Years: 40.00    Types: Cigarettes    Quit date: 06/18/2012    Years since quitting: 9.0   Smokeless tobacco: Never  Vaping Use   Vaping Use: Never used  Substance Use Topics   Alcohol use: No    Alcohol/week: 0.0 standard drinks   Drug use: No     Medication list has been reviewed and updated.  Current Meds  Medication Sig   aspirin EC 81 MG tablet Take 81 mg by mouth daily.   atorvastatin (LIPITOR) 20 MG tablet TAKE 1 TABLET(20 MG) BY MOUTH DAILY   glucose blood (ONE TOUCH ULTRA TEST) test strip Use to test BS twice a day   hydrocortisone 2.5 % cream Apply topically.   ibuprofen (ADVIL,MOTRIN) 200 MG tablet Take 600 mg by mouth every 6 (six) hours as needed for headache or moderate pain.   ketoconazole (NIZORAL) 2 % cream Apply 1 application topically daily as needed.   Lancets (ONETOUCH DELICA PLUS NATFTD32K) MISC USE 1 EACH TWICE DAILY   levothyroxine (SYNTHROID) 150 MCG tablet Take 150 mcg by mouth daily.   lisinopril-hydrochlorothiazide (ZESTORETIC) 10-12.5 MG tablet Take 1 tablet by mouth daily.   meloxicam (MOBIC) 15 MG tablet Take 15 mg by mouth daily.   metFORMIN (GLUCOPHAGE-XR) 500 MG 24 hr tablet Take 2 tablets (1,000 mg total) by mouth 2 (two) times daily.   Multiple Vitamin (MULTIVITAMIN WITH MINERALS) TABS tablet Take 1 tablet by mouth daily.   Polyvinyl Alcohol-Povidone (REFRESH OP) Place 1 drop into both eyes daily as needed (dry eyes).   pregabalin (LYRICA) 50 MG capsule Take 1 capsule (50 mg total) by mouth 4 (four) times daily.   Pumpkin Seed-Soy Germ (AZO BLADDER CONTROL/GO-LESS) CAPS  Take 1 capsule by mouth 2 (two) times daily.   traMADol (ULTRAM) 50 MG tablet TAKE 2 TABLETS(100 MG) BY MOUTH EVERY 6 HOURS AS NEEDED    PHQ 2/9 Scores 06/20/2021 02/15/2021 09/13/2020 05/09/2020  PHQ - 2 Score 0 0 0 0  PHQ- 9 Score 2 2 0 0    GAD 7 : Generalized Anxiety Score 06/20/2021 02/15/2021 09/13/2020 05/09/2020  Nervous, Anxious, on Edge 0 0 0 0  Control/stop worrying 0 0 0 0  Worry too much - different things 0 0 0 0  Trouble relaxing 0 0 0 0  Restless 0 0 0 0  Easily annoyed or irritable 0 0 0 0  Afraid - awful might happen 0 0 0 0  Total GAD 7 Score 0 0 0 0  Anxiety Difficulty Not difficult at all - Not difficult at all -    BP Readings from Last 3 Encounters:  06/20/21 128/78  02/15/21 (!) 92/52  09/13/20 100/78    Physical Exam Vitals  and nursing note reviewed.  Constitutional:      General: She is not in acute distress.    Appearance: She is well-developed.  HENT:     Head: Normocephalic and atraumatic.  Neck:     Vascular: No carotid bruit.  Cardiovascular:     Rate and Rhythm: Normal rate and regular rhythm.     Pulses: Normal pulses.     Heart sounds: No murmur heard. Pulmonary:     Effort: Pulmonary effort is normal. No respiratory distress.     Breath sounds: No wheezing or rhonchi.  Musculoskeletal:     Cervical back: Normal range of motion.     Right knee: Bony tenderness present. Decreased range of motion. Tenderness present.     Right lower leg: No edema.     Left lower leg: No edema.  Lymphadenopathy:     Cervical: No cervical adenopathy.  Skin:    General: Skin is warm and dry.     Findings: No rash.  Neurological:     Mental Status: She is alert and oriented to person, place, and time.  Psychiatric:        Mood and Affect: Mood normal.        Behavior: Behavior normal.    Wt Readings from Last 3 Encounters:  06/20/21 241 lb (109.3 kg)  02/15/21 242 lb (109.8 kg)  09/13/20 248 lb (112.5 kg)    BP 128/78    Pulse 72    Ht 5' 8"  (1.727  m)    Wt 241 lb (109.3 kg)    SpO2 97%    BMI 36.64 kg/m   Assessment and Plan: 1. Type 2 diabetes mellitus with stage 3a chronic kidney disease, without long-term current use of insulin ([place]) Clinically stable by exam and report without s/s of hypoglycemia. DM complicated by hypertension and dyslipidemia. CKD stage 3. Tolerating medications well without side effects or other concerns. Eye exam due soon. - Comprehensive metabolic panel - Hemoglobin A1c - Microalbumin / creatinine urine ratio  2. Essential (primary) hypertension Clinically stable exam with well controlled BP. Tolerating medications without side effects at this time. Pt to continue current regimen and low sodium diet; benefits of regular exercise as able discussed.  3. Aortic atherosclerosis (H[facility]) Tolerating statin medication without side effects at this time LDL is almost at goal of < 70 on current dose Continue same therapy without change at this time.  4. Adrenal nodule ([place]) Seen by Endo and considered benign - no further follow up needed.  5. Primary osteoarthritis of both knees Planning Right TKA in the near future.  Partially dictated using Editor, commissioning. Any errors are unintentional.  Halina Maidens, MD [facility]  06/20/2021

## 2021-06-21 LAB — COMPREHENSIVE METABOLIC PANEL
ALT: 17 IU/L (ref 0–32)
AST: 17 IU/L (ref 0–40)
Albumin/Globulin Ratio: 1.4 (ref 1.2–2.2)
Albumin: 4.1 g/dL (ref 3.7–4.7)
Alkaline Phosphatase: 81 IU/L (ref 44–121)
BUN/Creatinine Ratio: 24 (ref 12–28)
BUN: 26 mg/dL (ref 8–27)
Bilirubin Total: 0.2 mg/dL (ref 0.0–1.2)
CO2: 23 mmol/L (ref 20–29)
Calcium: 9.7 mg/dL (ref 8.7–10.3)
Chloride: 101 mmol/L (ref 96–106)
Creatinine, Ser: 1.1 mg/dL — ABNORMAL HIGH (ref 0.57–1.00)
Globulin, Total: 3 g/dL (ref 1.5–4.5)
Glucose: 117 mg/dL — ABNORMAL HIGH (ref 70–99)
Potassium: 5.1 mmol/L (ref 3.5–5.2)
Sodium: 141 mmol/L (ref 134–144)
Total Protein: 7.1 g/dL (ref 6.0–8.5)
eGFR: 53 mL/min/{1.73_m2} — ABNORMAL LOW (ref >59)

## 2021-06-21 LAB — HEMOGLOBIN A1C
Est. average glucose Bld gHb Est-mCnc: 131 mg/dL
Hgb A1c MFr Bld: 6.2 % — ABNORMAL HIGH (ref 4.8–5.6)

## 2021-06-22 LAB — MICROALBUMIN / CREATININE URINE RATIO
Creatinine, Urine: 148.1 mg/dL
Microalb/Creat Ratio: 8 mg/g creat (ref 0–29)
Microalbumin, Urine: 12.4 ug/mL

## 2021-06-23 DIAGNOSIS — E119 Type 2 diabetes mellitus without complications: Secondary | ICD-10-CM | POA: Diagnosis not present

## 2021-06-23 LAB — HM DIABETES EYE EXAM

## 2021-06-28 ENCOUNTER — Ambulatory Visit
Admission: RE | Admit: 2021-06-28 | Discharge: 2021-06-28 | Disposition: A | Discharge status: Home / Self Care | Payer: Medicare Other | Source: Ambulatory Visit | Attending: Orthopedic Surgery | Admitting: Orthopedic Surgery

## 2021-06-28 ENCOUNTER — Other Ambulatory Visit: Payer: Self-pay

## 2021-06-28 DIAGNOSIS — M25751 Osteophyte, right hip: Secondary | ICD-10-CM | POA: Diagnosis not present

## 2021-06-28 DIAGNOSIS — M19071 Primary osteoarthritis, right ankle and foot: Secondary | ICD-10-CM | POA: Diagnosis not present

## 2021-06-28 DIAGNOSIS — M25761 Osteophyte, right knee: Secondary | ICD-10-CM | POA: Diagnosis not present

## 2021-06-28 DIAGNOSIS — M1711 Unilateral primary osteoarthritis, right knee: Secondary | ICD-10-CM | POA: Insufficient documentation

## 2021-07-19 ENCOUNTER — Encounter: Payer: Self-pay | Admitting: Internal Medicine

## 2021-07-21 ENCOUNTER — Encounter: Payer: Self-pay | Admitting: Internal Medicine

## 2021-07-26 ENCOUNTER — Other Ambulatory Visit: Payer: Self-pay | Admitting: Orthopedic Surgery

## 2021-07-28 ENCOUNTER — Other Ambulatory Visit: Payer: Self-pay | Admitting: Internal Medicine

## 2021-07-28 DIAGNOSIS — M170 Bilateral primary osteoarthritis of knee: Secondary | ICD-10-CM

## 2021-07-28 NOTE — Telephone Encounter (Signed)
Requested medication (s) are due for refill today - yes  Requested medication (s) are on the active medication list -yes  Future visit scheduled -yes  Last refill: 04/24/21 #240 2RF  Notes to clinic: Request RF: non delegated Rx  Requested Prescriptions  Pending Prescriptions Disp Refills   traMADol (ULTRAM) 50 MG tablet [Pharmacy Med Name: TRAMADOL 50MG  TABLETS] 240 tablet     Sig: TAKE 2 TABLETS(100 MG) BY MOUTH EVERY 6 HOURS AS NEEDED     Not Delegated - Analgesics:  Opioid Agonists Failed - 07/28/2021  9:39 AM      Failed - This refill cannot be delegated      Failed - Urine Drug Screen completed in last 360 days      Passed - Valid encounter within last 3 months    Recent Outpatient Visits           1 month ago Type 2 diabetes mellitus with stage 3a chronic kidney disease, without long-term current use of insulin ([place])   [facility] Glean Hess, MD   5 months ago Annual physical exam   [facility] Glean Hess, MD   10 months ago Type II diabetes mellitus with complication [facility])   [facility] Glean Hess, MD   1 year ago Type II diabetes mellitus with complication [facility])   [facility] Glean Hess, MD   1 year ago Annual physical exam   [facility] Glean Hess, MD       Future Appointments             In 2 months Glean Hess, MD [facility], [place]   In 6 months Glean Hess, MD [facility], [facility]               Requested Prescriptions  Pending Prescriptions Disp Refills   traMADol (ULTRAM) 50 MG tablet [Pharmacy Med Name: TRAMADOL 50MG  TABLETS] 240 tablet     Sig: TAKE 2 TABLETS(100 MG) BY MOUTH EVERY 6 HOURS AS NEEDED     Not Delegated - Analgesics:  Opioid Agonists Failed - 07/28/2021  9:39 AM      Failed - This refill cannot be delegated      Failed - Urine Drug Screen completed in last 360 days      Passed - Valid encounter within last 3 months     Recent Outpatient Visits           1 month ago Type 2 diabetes mellitus with stage 3a chronic kidney disease, without long-term current use of insulin [facility])   [facility] Glean Hess, MD   5 months ago Annual physical exam   [facility] Glean Hess, MD   10 months ago Type II diabetes mellitus with complication [facility])   [facility] Glean Hess, MD   1 year ago Type II diabetes mellitus with complication [facility])   [facility] Glean Hess, MD   1 year ago Annual physical exam   [facility] Glean Hess, MD       Future Appointments             In 2 months Army Melia Jesse Sans, MD [facility], [place]   In 6 months Army Melia, Jesse Sans, MD [facility], [facility]

## 2021-07-28 NOTE — Telephone Encounter (Signed)
Refill if appropriate.  Please advise. Last fill 06/25/2021.

## 2021-08-02 DIAGNOSIS — H26492 Other secondary cataract, left eye: Secondary | ICD-10-CM | POA: Diagnosis not present

## 2021-08-03 ENCOUNTER — Other Ambulatory Visit: Payer: Self-pay

## 2021-08-03 ENCOUNTER — Other Ambulatory Visit: Payer: Medicare Other

## 2021-08-03 ENCOUNTER — Encounter (HOSPITAL_COMMUNITY): Payer: Self-pay | Admitting: Urgent Care

## 2021-08-03 ENCOUNTER — Encounter
Admission: RE | Admit: 2021-08-03 | Discharge: 2021-08-03 | Disposition: A | Discharge status: Home / Self Care | Payer: Medicare Other | Source: Ambulatory Visit | Attending: Orthopedic Surgery | Admitting: Orthopedic Surgery

## 2021-08-03 VITALS — BP 105/68 | HR 57 | Resp 16 | Ht 68.0 in | Wt 235.2 lb

## 2021-08-03 DIAGNOSIS — Z01818 Encounter for other preprocedural examination: Secondary | ICD-10-CM | POA: Insufficient documentation

## 2021-08-03 DIAGNOSIS — I2510 Atherosclerotic heart disease of native coronary artery without angina pectoris: Secondary | ICD-10-CM

## 2021-08-03 HISTORY — DX: Unspecified osteoarthritis, unspecified site: M19.90

## 2021-08-03 HISTORY — DX: Hypothyroidism, unspecified: E03.9

## 2021-08-03 LAB — TYPE AND SCREEN
ABO/RH(D): O NEG
Antibody Screen: NEGATIVE

## 2021-08-03 LAB — COMPREHENSIVE METABOLIC PANEL
ALT: 18 U/L (ref 0–44)
AST: 18 U/L (ref 15–41)
Albumin: 3.8 g/dL (ref 3.5–5.0)
Alkaline Phosphatase: 69 U/L (ref 38–126)
Anion gap: 6 (ref 5–15)
BUN: 49 mg/dL — ABNORMAL HIGH (ref 8–23)
CO2: 27 mmol/L (ref 22–32)
Calcium: 9.3 mg/dL (ref 8.9–10.3)
Chloride: 107 mmol/L (ref 98–111)
Creatinine, Ser: 1.87 mg/dL — ABNORMAL HIGH (ref 0.44–1.00)
GFR, Estimated: 28 mL/min — ABNORMAL LOW (ref >60)
Glucose, Bld: 96 mg/dL (ref 70–99)
Potassium: 5.7 mmol/L — ABNORMAL HIGH (ref 3.5–5.1)
Sodium: 140 mmol/L (ref 135–145)
Total Bilirubin: 0.4 mg/dL (ref 0.3–1.2)
Total Protein: 7.6 g/dL (ref 6.5–8.1)

## 2021-08-03 LAB — URINALYSIS, ROUTINE W REFLEX MICROSCOPIC
Bilirubin Urine: NEGATIVE
Glucose, UA: NEGATIVE mg/dL
Hgb urine dipstick: NEGATIVE
Ketones, ur: NEGATIVE mg/dL
Leukocytes,Ua: NEGATIVE
Nitrite: NEGATIVE
Protein, ur: NEGATIVE mg/dL
Specific Gravity, Urine: 1.016 (ref 1.005–1.030)
pH: 5 (ref 5.0–8.0)

## 2021-08-03 LAB — SURGICAL PCR SCREEN
MRSA, PCR: NEGATIVE
Staphylococcus aureus: POSITIVE — AB

## 2021-08-03 NOTE — Progress Notes (Addendum)
°  [facility] Perioperative Services: Pre-Admission/Anesthesia Testing  Abnormal Lab Notification   Date: 08/03/21  Name: Kendra Walton MRN:   [id number]  Re: Abnormal labs noted during PAT appointment   Notified:  Provider Name Provider Role Notification Mode  Hessie Knows, MD Orthopedics Routed and/or faxed via [place] and Notes:  ABNORMAL LAB VALUE(S): Lab Results  Component Value Date   K 5.7 (H) 08/03/2021   [name] is scheduled for a TOTAL KNEE ARTHROPLASTY (Right: Knee) on 08/17/2021. Patient has a history of mild hyperkalemia (5.1-5.9 mmol/L) over the course of the last 2 years. Just wanted to make you aware.   This is a Community education officer; no formal response is required.  Honor Loh, MSN, APRN, FNP-C, CEN [facility]  Peri-operative Services Nurse Practitioner Phone: [phone] Fax: [phone] 08/03/21 2:07 PM

## 2021-08-03 NOTE — Patient Instructions (Addendum)
Your procedure is scheduled on:08-17-21 Thursday Report to the Registration Desk on the 1st floor of the [facility].Then proceed to the 2nd floor Surgery Desk in the [facility] To find out your arrival time, please call [phone] between 1PM - 3PM on:08-16-21 Wednesday  REMEMBER: Instructions that are not followed completely may result in serious medical risk, up to and including death; or upon the discretion of your surgeon and anesthesiologist your surgery may need to be rescheduled.  Do not eat food after midnight the night before surgery.  No gum chewing, lozengers or hard candies.  You may however, drink Water up to 2 hours before you are scheduled to arrive for your surgery. Do not drink anything within 2 hours of your scheduled arrival time.  Type 1 and Type 2 diabetics should only drink water.  TAKE THESE MEDICATIONS THE MORNING OF SURGERY WITH A SIP OF WATER: -levothyroxine (SYNTHROID) -pregabalin (LYRICA)   Stop metFORMIN (GLUCOPHAGE-XR) 2 days prior to surgery-Last dose on 08-14-21 Monday  Call Dr Theodore Demark office today (08-03-21) about when to stop your aspirin EC 81 MG tablet  One week prior to surgery: (Last dose on 08-09-21) Stop Anti-inflammatories (NSAIDS) such asmeloxicam (MOBIC) , Advil, Aleve, Ibuprofen, Motrin, Naproxen, Naprosyn and Aspirin based products such as Excedrin, Goodys Powder, BC Powder.You may however, continue to take Tylenol/Tramadol if needed for pain up until the day of surgery. Stop your aspirin EC 325 MG tablet 7 days prior to surgery  Stop ANY OVER THE COUNTER supplements/vitamins 7 days prior to surgery (Ferrous Gluconate (IRON 27 PO),Multiple Vitamin ,(AZO BLADDER CONTROL/GO-LESS), vitamin B-12 , Vitamin C, Fish Oil)  No Alcohol for 24 hours before or after surgery.  No Smoking including e-cigarettes for 24 hours prior to surgery.  No chewable tobacco products for at least 6 hours prior to surgery.  No nicotine patches on the day of  surgery.  Do not use any "recreational" drugs for at least a week prior to your surgery.  Please be advised that the combination of cocaine and anesthesia may have negative outcomes, up to and including death. If you test positive for cocaine, your surgery will be cancelled.  On the morning of surgery brush your teeth with toothpaste and water, you may rinse your mouth with mouthwash if you wish. Do not swallow any toothpaste or mouthwash.  Use CHG Soap as directed on instruction sheet.  Do not wear jewelry, make-up, hairpins, clips or nail polish.  Do not wear lotions, powders, or perfumes.   Do not shave body from the neck down 48 hours prior to surgery just in case you cut yourself which could leave a site for infection.  Also, freshly shaved skin may become irritated if using the CHG soap.  Contact lenses, hearing aids and dentures may not be worn into surgery.  Do not bring valuables to the hospital. [facility] is not responsible for any missing/lost belongings or valuables.   Notify your doctor if there is any change in your medical condition (cold, fever, infection).  Wear comfortable clothing (specific to your surgery type) to the hospital.  After surgery, you can help prevent lung complications by doing breathing exercises.  Take deep breaths and cough every 1-2 hours. Your doctor may order a device called an Incentive Spirometer to help you take deep breaths. When coughing or sneezing, hold a pillow firmly against your incision with both hands. This is called splinting. Doing this helps protect your incision. It also decreases belly discomfort.  If  you are being admitted to the hospital overnight, leave your suitcase in the car. After surgery it may be brought to your room.  If you are being discharged the day of surgery, you will not be allowed to drive home. You will need a responsible adult (18 years or older) to drive you home and stay with you that night.   If  you are taking public transportation, you will need to have a responsible adult (18 years or older) with you. Please confirm with your physician that it is acceptable to use public transportation.   Please call the [facility]. at [phone] if you have any questions about these instructions.  Surgery Visitation Policy:  Patients undergoing a surgery or procedure may have one family member or support person with them as long as that person is not COVID-19 positive or experiencing its symptoms.  That person may remain in the waiting area during the procedure and may rotate out with other people.  Inpatient Visitation:    Visiting hours are 7 a.m. to 8 p.m. Up to two visitors ages 16+ are allowed at one time in a patient room. The visitors may rotate out with other people during the day. Visitors must check out when they leave, or other visitors will not be allowed. One designated support person may remain overnight. The visitor must pass COVID-19 screenings, use hand sanitizer when entering and exiting the patients room and wear a mask at all times, including in the patients room. Patients must also wear a mask when staff or their visitor are in the room. Masking is required regardless of vaccination status.

## 2021-08-11 ENCOUNTER — Encounter: Payer: Self-pay | Admitting: Orthopedic Surgery

## 2021-08-11 NOTE — Progress Notes (Incomplete)
Perioperative Services  Pre-Admission/Anesthesia Testing Clinical Review  Date: 08/11/21  Patient Demographics:  Name: Kendra Walton DOB:   03-25-1951 MRN:   [id number]  Planned Surgical Procedure(s):    Case: [id number] Date/Time: 08/17/21 1005   Procedure: TOTAL KNEE ARTHROPLASTY (Right: Knee)   Anesthesia type: Choice   Pre-op diagnosis: Primary osteoarthritis of right knee M17.11   Location: [facility] OR ROOM 01 / [place] [facility] FOR ANESTHESIA [facility]   Surgeons: Hessie Knows, MD   NOTE: Available PAT nursing documentation and vital signs have been reviewed. Clinical nursing staff has updated patient's PMH/PSHx, current medication list, and drug allergies/intolerances to ensure comprehensive history available to assist in medical decision making as it pertains to the aforementioned surgical procedure and anticipated anesthetic course. Extensive review of available clinical information performed. [place] PMH and PSHx updated with any diagnoses/procedures that  may have been inadvertently omitted during her intake with the pre-admission testing department's nursing staff.  Clinical Discussion:  [name] is a 71 y.o. female who is submitted for pre-surgical anesthesia review and clearance prior to her undergoing the above procedure. Patient is a Former Smoker (40 pack years; quit 06/2012). Pertinent PMH includes: non-obstructive coronary artery calcification, severe aortic valve stenosis (s/p TAVR), CHF, HTN, HLD, T2DM, hypothyroidism, CKD-III,  absolute anemia, leukocytosis, neuropathy, RIGHT breast cancer (s/p mastectomy).  Patient is followed by cardiology Ubaldo Glassing, MD). She was last seen in the cardiology clinic on ***; notes reviewed. *** Patient has a past medical history significant for cardiovascular diagnoses.  Diagnostic right/left heart catheterization performed on 08/06/2018 revealing no significant obstructive CAD; 20% stenosis of the ostial ramus intermedius.   Ejection fraction reduced at 35%.  There was severe aortic valve stenosis with a mean gradient of 48.2 mmHg; AVA (VTI) = 0.58 cm.  Mean wedge pressure 27 mmHg.  Patient referred to CVTS for consideration of AVR.  Patient underwent T[facility]R on 08/18/2018 at [facility].  Procedure performed via a LEFT axillary approach.  Last TTE was performed on 10/20/2019 revealing a normal left ventricular systolic function with mild LVH; LVEF >55%.  There was trivial MR/TR and mild PR.  There was no evidence of valvular stenosis.  No pericardial effusion.  ***Patient with a PMH significant for ***. CHA2DS2-VASc Score = ***. Patient chronically anticoagulated using ***; compliant with therapy with no evidence of GI bleeding. Last TTE performed on *** revealed normal left ventricular systolic function with an EF of ***%.  Subsequent myocardial perfusion imaging study performed on ***revealed no evidence of stress-induced myocardial ischemia or arrhythmia (see full interpretation of cardiovascular testing below). Patient on GDMT for her HTN and HLD diagnoses.  Blood pressure well controlled at *** on currently prescribed *** therapies.  Patient is on a statin for her HLD. T2DM *** controlled on currently prescribed regimen; Hgb A1c ***% when last checked on ***. Functional capacity, as defined by DASI, is documented as being >/= 4 METS.  No changes were made to patient's medication regimen.  Patient to follow-up with outpatient cardiology in *** months or sooner if needed.  [name] is scheduled for an elective RIGHT TOTAL KNEE ARTHROPLASTY on 08/17/2021 with Dr. Hessie Knows, MD. Given patient's past medical history significant for cardiovascular diagnoses, presurgical cardiac clearance was sought by the PAT team. ***.  In review of her medication reconciliation, it is noted the patient is on daily antiplatelet therapy.  She has been instructed of recommendations from her cardiologist for  holding her daily ASA dose for***days  prior to her procedure with plans to restart as soon as postoperative bleeding risk felt to be minimized by her primary attending surgeon.  The patient is aware that her last dose of ASA should be on***.  Patient denies previous perioperative complications with anesthesia in the past. In review of the available records, it is noted that patient underwent a general anesthetic course at [facility] ([facility]) in 08/2018 without documented complications.   Vitals with BMI 08/03/2021 06/20/2021 02/15/2021  Height 5\' 8"  5\' 8"  5\' 8"   Weight 235 lbs 4 oz 241 lbs 242 lbs  BMI 35.78 75.17 00.1  Systolic 749 449 92  Diastolic 68 78 52  Pulse 57 72 62    Providers/Specialists:   NOTE: Primary physician provider listed below. Patient may have been seen by APP or partner within same practice.   PROVIDER ROLE / SPECIALTY LAST Fabio Bering, MD Orthopedics (Surgeon) 08/09/2021  Glean Hess, MD Primary Care Provider 06/20/2021  Bartholome Bill, MD Cardiology  ***  Mee Hives, MD Endocrinology 02/13/2021   Allergies:  Shellfish allergy  Current Home Medications:   No current facility-administered medications for this encounter.    aspirin EC 325 MG tablet   aspirin EC 81 MG tablet   atorvastatin (LIPITOR) 20 MG tablet   Ferrous Gluconate (IRON 27 PO)   hydrocortisone 2.5 % cream   ibuprofen (ADVIL,MOTRIN) 200 MG tablet   ketoconazole (NIZORAL) 2 % cream   levothyroxine (SYNTHROID) 150 MCG tablet   lisinopril-hydrochlorothiazide (ZESTORETIC) 10-12.5 MG tablet   meloxicam (MOBIC) 15 MG tablet   metFORMIN (GLUCOPHAGE-XR) 500 MG 24 hr tablet   Multiple Vitamin (MULTIVITAMIN WITH MINERALS) TABS tablet   Polyvinyl Alcohol-Povidone (REFRESH OP)   pregabalin (LYRICA) 50 MG capsule   Pumpkin Seed-Soy Germ (AZO BLADDER CONTROL/GO-LESS) CAPS   traMADol (ULTRAM) 50 MG tablet   vitamin B-12 (CYANOCOBALAMIN) 500 MCG tablet   Ascorbic  Acid (VITAMIN C PO)   glucose blood (ONE TOUCH ULTRA TEST) test strip   Lancets (ONETOUCH DELICA PLUS [id number]) MISC   Omega-3 Fatty Acids (FISH OIL PO)   History:   Past Medical History:  Diagnosis Date   Absolute anemia 09/15/2015   Patient declines colonoscopy    Adrenal nodule ([place]) 09/02/2018   Followed by Endocrinology - labs normal; benign with no further follow up needed   Aortic stenosis, moderate 04/05/2017   a.) s/p TAVR 08/18/2018; LEFT axillary approach   Arthritis    Breast cancer, right ([place]) 2010   a.) s/p RIGHT mastectomy; no chemotherpay or XRT   CHF (congestive heart failure) (HCC)    CKD (chronic kidney disease), stage III (HCC)    Coronary artery calcification 08/06/2018   a.) R/LHC 08/06/2018: EF 35%; 20% stenosis oRI   Ectopic pregnancy, tubal    History of transcatheter aortic valve replacement (TAVR) 08/18/2018   HLD (hyperlipidemia)    Hypertension    Hypothyroidism    Leukocytosis 09/15/2015   Hematology eval - likely benign; follow up planned    Neuropathy    Post-menopausal bleeding 04/13/2016   US showed thickened endometrium; biopsy recommended but patient declined   T2DM (type 2 diabetes mellitus) ([place])    Past Surgical History:  Procedure Laterality Date   CESAREAN SECTION     CHOLECYSTECTOMY  2005   ECTOPIC PREGNANCY SURGERY     MASTECTOMY Right 2010   partial   REDUCTION MAMMAPLASTY Left 2011   RIGHT/LEFT HEART CATH AND CORONARY ANGIOGRAPHY Bilateral 08/06/2018  Procedure: RIGHT/LEFT HEART CATH AND CORONARY ANGIOGRAPHY;  Surgeon: Teodoro Spray, MD;  Location: [place] [facility];  Service: Cardiovascular;  Laterality: Bilateral;   TRANSCATHETER AORTIC VALVE REPLACEMENT, TRANSAPICAL  08/18/2018   Procedure: TRANSCATHETER AORTIC VALVE REPLACEMENT; Location: [facility]; Surgeon: Sharmon Leyden, MD   Family History  Problem Relation Age of Onset   Hypertension Brother    Diabetes Brother    Breast cancer Neg Hx    Social History    Tobacco Use   Smoking status: Former    Packs/day: 1.00    Years: 40.00    Pack years: 40.00    Types: Cigarettes    Quit date: 06/18/2012    Years since quitting: 9.1   Smokeless tobacco: Never  Vaping Use   Vaping Use: Never used  Substance Use Topics   Alcohol use: No    Alcohol/week: 0.0 standard drinks   Drug use: No    Pertinent Clinical Results:  LABS: Labs reviewed: Acceptable for surgery.  No visits with results within 3 Day(s) from this visit.  Latest known visit with results is:  Hospital Outpatient Visit on 08/03/2021  Component Date Value Ref Range Status   MRSA, PCR 08/03/2021 NEGATIVE  NEGATIVE Final   Staphylococcus aureus 08/03/2021 POSITIVE (A)  NEGATIVE Final   Comment: (NOTE) The Xpert SA Assay (FDA approved for NASAL specimens in patients 32 years of age and older), is one component of a comprehensive surveillance program. It is not intended to diagnose infection nor to guide or monitor treatment. Performed at [facility], [place], [place] [address]    Sodium 08/03/2021 140  135 - 145 mmol/L Final   Potassium 08/03/2021 5.7 (H)  3.5 - 5.1 mmol/L Final   Chloride 08/03/2021 107  98 - 111 mmol/L Final   CO2 08/03/2021 27  22 - 32 mmol/L Final   Glucose, Bld 08/03/2021 96  70 - 99 mg/dL Final   Glucose reference range applies only to samples taken after fasting for at least 8 hours.   BUN 08/03/2021 49 (H)  8 - 23 mg/dL Final   Creatinine, Ser 08/03/2021 1.87 (H)  0.44 - 1.00 mg/dL Final   Calcium 08/03/2021 9.3  8.9 - 10.3 mg/dL Final   Total Protein 08/03/2021 7.6  6.5 - 8.1 g/dL Final   Albumin 08/03/2021 3.8  3.5 - 5.0 g/dL Final   AST 08/03/2021 18  15 - 41 U/L Final   ALT 08/03/2021 18  0 - 44 U/L Final   Alkaline Phosphatase 08/03/2021 69  38 - 126 U/L Final   Total Bilirubin 08/03/2021 0.4  0.3 - 1.2 mg/dL Final   GFR, Estimated 08/03/2021 28 (L)  >60 mL/min Final   Comment: (NOTE) Calculated using the CKD-EPI  Creatinine Equation (2021)    Anion gap 08/03/2021 6  5 - 15 Final   Performed at [facility], [place], [place] [address]   Color, Urine 08/03/2021 YELLOW (A)  YELLOW Final   APPearance 08/03/2021 CLEAR (A)  CLEAR Final   Specific Gravity, Urine 08/03/2021 1.016  1.005 - 1.030 Final   pH 08/03/2021 5.0  5.0 - 8.0 Final   Glucose, UA 08/03/2021 NEGATIVE  NEGATIVE mg/dL Final   Hgb urine dipstick 08/03/2021 NEGATIVE  NEGATIVE Final   Bilirubin Urine 08/03/2021 NEGATIVE  NEGATIVE Final   Ketones, ur 08/03/2021 NEGATIVE  NEGATIVE mg/dL Final   Protein, ur 08/03/2021 NEGATIVE  NEGATIVE mg/dL Final   Nitrite 08/03/2021 NEGATIVE  NEGATIVE  Final   Leukocytes,Ua 08/03/2021 NEGATIVE  NEGATIVE Final   Performed at [facility], [place], [place] [address]   ABO/RH(D) 08/03/2021 O NEG   Final   Antibody Screen 08/03/2021 NEG   Final   Sample Expiration 08/03/2021 08/17/2021,2359   Final   Extend sample reason 08/03/2021    Final                   Value:NO TRANSFUSIONS OR PREGNANCY IN THE PAST 3 MONTHS Performed at [facility], [address] [place],  [address]     ECG: Date: 08/03/2021 Time ECG obtained: 1258 PM Rate: 58 bpm Rhythm:  Sinus bradycardia with sinus arrhythmia; LVH Axis (leads I and aVF): Normal Intervals: PR 184 ms. QRS 112 ms. QTc 420 ms. ST segment and T wave changes: No evidence of acute ST segment elevation or depression.  Evidence of an age undetermined anterior infarct present Comparison: Last tracing on 08/19/2018 showed normal sinus rhythm with a nonspecific IVCD; LVH with repolarization abnormality; prolonged QT.   IMAGING / PROCEDURES: CT RIGHT KNEE WITHOUT CONTRAST performed on 06/28/2021 Moderate to severe tricompartmental osteoarthritis of the right knee most pronounced within the medial compartment with joint space narrowing, subchondral sclerosis, and marginal osteophyte formation. Trace  knee joint effusion  DIAGNOSTIC RADIOGRAPHS OF RIGHT KNEE 4+ VIEWS performed on 04/25/2021 Medial compartment narrowing with no complete bone-on-bone pathology The PA flexed view shows more narrowing medially with still no bone-on-bone pathology  TRANSTHORACIC ECHOCARDIOGRAM performed on 10/20/2019 LVEF >55% Normal left ventricular systolic function with mild LVH Normal right ventricular systolic function Trivial MR and TR Mild PR No AR No valvular stenosis Well-seated and normal functioning bioprosthetic aortic valve No evidence of a pericardial effusion  RIGHT/LEFT HEART CATHETERIZATION AND CORONARY ANGIOGRAPHY performed on 08/06/2018 Nonobstructive CAD 20% stenosis of the ostial ramus intermedius LVEF 35% Severe aortic valve stenosis with a mean pressure gradient of 48.2 mmHg; AVA (VTI) = 0.58 cm Mean pulmonary wedge pressure 27 mmHg Refer to CVTS for consideration of AVR  Impression and Plan:  Ilka Lovick Cuff has been referred for pre-anesthesia review and clearance prior to her undergoing the planned anesthetic and procedural courses. Available labs, pertinent testing, and imaging results were personally reviewed by me. This patient has been appropriately cleared by cardiology with an overall *** risk of significant perioperative cardiovascular complications.  Based on clinical review performed today (08/11/21), barring any significant acute changes in the patient's overall condition, it is anticipated that she will be able to proceed with the planned surgical intervention. Any acute changes in clinical condition may necessitate her procedure being postponed and/or cancelled. Patient will meet with anesthesia team (MD and/or CRNA) on the day of her procedure for preoperative evaluation/assessment. Questions regarding anesthetic course will be fielded at that time.   Pre-surgical instructions were reviewed with the patient during her PAT appointment and questions were  fielded by PAT clinical staff. Patient was advised that if any questions or concerns arise prior to her procedure then she should return a call to PAT and/or her surgeon's office to discuss.  Honor Loh, MSN, APRN, FNP-C, CEN [facility]  Peri-operative Services Nurse Practitioner Phone: [phone] Fax: [phone] 08/11/21 10:44 AM  NOTE: This note has been prepared using Dragon dictation software. Despite my best ability to proofread, there is always the potential that unintentional transcriptional errors may still occur from this process.

## 2021-08-14 ENCOUNTER — Other Ambulatory Visit: Payer: Self-pay

## 2021-08-14 ENCOUNTER — Other Ambulatory Visit: Payer: Self-pay | Admitting: Internal Medicine

## 2021-08-14 DIAGNOSIS — E1142 Type 2 diabetes mellitus with diabetic polyneuropathy: Secondary | ICD-10-CM

## 2021-08-14 MED ORDER — PREGABALIN 50 MG PO CAPS: 50.0000 mg | ORAL_CAPSULE | Freq: Four times a day (QID) | ORAL | 0 refills | Status: DC

## 2021-08-14 NOTE — Telephone Encounter (Signed)
Please refill.  KP

## 2021-08-15 ENCOUNTER — Inpatient Hospital Stay: Admission: RE | Admit: 2021-08-15 | Payer: Medicare Other | Source: Ambulatory Visit

## 2021-08-17 ENCOUNTER — Ambulatory Visit: Admission: RE | Admit: 2021-08-17 | Payer: Medicare Other | Source: Home / Self Care | Admitting: Orthopedic Surgery

## 2021-08-17 ENCOUNTER — Encounter: Admission: RE | Payer: Self-pay | Source: Home / Self Care

## 2021-08-17 DIAGNOSIS — Z952 Presence of prosthetic heart valve: Secondary | ICD-10-CM | POA: Diagnosis not present

## 2021-08-17 HISTORY — DX: Type 2 diabetes mellitus without complications: E11.9

## 2021-08-17 HISTORY — DX: Heart failure, unspecified: I50.9

## 2021-08-17 HISTORY — DX: Chronic kidney disease, stage 3 unspecified: N18.30

## 2021-08-17 HISTORY — DX: Polyneuropathy, unspecified: G62.9

## 2021-08-17 HISTORY — DX: Hyperlipidemia, unspecified: E78.5

## 2021-08-17 SURGERY — ARTHROPLASTY, KNEE, TOTAL
Anesthesia: Choice | Site: Knee | Laterality: Right

## 2021-08-27 ENCOUNTER — Other Ambulatory Visit: Payer: Self-pay | Admitting: Internal Medicine

## 2021-08-27 ENCOUNTER — Encounter: Payer: Self-pay | Admitting: Internal Medicine

## 2021-08-27 DIAGNOSIS — I10 Essential (primary) hypertension: Secondary | ICD-10-CM

## 2021-08-27 DIAGNOSIS — E785 Hyperlipidemia, unspecified: Secondary | ICD-10-CM

## 2021-08-27 DIAGNOSIS — E1169 Type 2 diabetes mellitus with other specified complication: Secondary | ICD-10-CM

## 2021-08-27 MED ORDER — ATORVASTATIN CALCIUM 20 MG PO TABS: 20.0000 mg | ORAL_TABLET | Freq: Every evening | ORAL | 3 refills | Status: DC

## 2021-08-27 MED ORDER — LISINOPRIL-HYDROCHLOROTHIAZIDE 10-12.5 MG PO TABS: 1.0000 | ORAL_TABLET | Freq: Every day | ORAL | 3 refills | Status: DC

## 2021-09-05 ENCOUNTER — Other Ambulatory Visit: Payer: Self-pay | Admitting: Internal Medicine

## 2021-09-05 DIAGNOSIS — E118 Type 2 diabetes mellitus with unspecified complications: Secondary | ICD-10-CM

## 2021-09-06 NOTE — Telephone Encounter (Signed)
Requested medications are due for refill today.  yes ? ?Requested medications are on the active medications list.  yes ? ?Last refill. 09/04/2020 #360 3 refills ? ?Future visit scheduled.   yes ? ?Notes to clinic.  Failed refill protocol due to abnormal labs. ? ? ? ?Requested Prescriptions  ?Pending Prescriptions Disp Refills  ? metFORMIN (GLUCOPHAGE-XR) 500 MG 24 hr tablet [Pharmacy Med Name: METFORMIN ER 500MG 24HR TABS] 360 tablet 3  ?  Sig: TAKE 2 TABLETS(1000 MG) BY MOUTH TWICE DAILY  ?  ? Endocrinology:  Diabetes - Biguanides Failed - 09/05/2021  6:18 AM  ?  ?  Failed - Cr in normal range and within 360 days  ?  Creatinine, Ser  ?Date Value Ref Range Status  ?08/03/2021 1.87 (H) 0.44 - 1.00 mg/dL Final  ?  ?  ?  ?  Failed - eGFR in normal range and within 360 days  ?  GFR calc Af Amer  ?Date Value Ref Range Status  ?05/09/2020 65 >59 mL/min/1.73 Final  ?  Comment:  ?  **In accordance with recommendations from the NKF-ASN Task force,** ?  [facility] is in the process of updating its eGFR calculation to the ?  2021 CKD-EPI creatinine equation that estimates kidney function ?  without a race variable. ?  ? ?GFR, Estimated  ?Date Value Ref Range Status  ?08/03/2021 28 (L) >60 mL/min Final  ?  Comment:  ?  (NOTE) ?Calculated using the CKD-EPI Creatinine Equation (2021) ?  ? ?eGFR  ?Date Value Ref Range Status  ?06/20/2021 53 (L) >59 mL/min/1.73 Final  ?  ?  ?  ?  Failed - B12 Level in normal range and within 720 days  ?  Vitamin B-12  ?Date Value Ref Range Status  ?10/04/2015 246 180 - 914 pg/mL Final  ?  Comment:  ?  (NOTE) ?This assay is not validated for testing neonatal or ?myeloproliferative syndrome specimens for Vitamin B12 levels. ?Performed at [facility] ?  ?  ?  ?  ?  Passed - HBA1C is between 0 and 7.9 and within 180 days  ?  Hgb A1c MFr Bld  ?Date Value Ref Range Status  ?06/20/2021 6.2 (H) 4.8 - 5.6 % Final  ?  Comment:  ?           Prediabetes: 5.7 - 6.4 ?         Diabetes: >6.4 ?         Glycemic  control for adults with diabetes: <7.0 ?  ?  ?  ?  ?  Passed - Valid encounter within last 6 months  ?  Recent Outpatient Visits   ? ?      ? 2 months ago Type 2 diabetes mellitus with stage 3a chronic kidney disease, without long-term current use of insulin ([place])  ? [facility] Glean Hess, MD  ? 6 months ago Annual physical exam  ? [facility] Glean Hess, MD  ? 11 months ago Type II diabetes mellitus with complication [facility])  ? [facility] Glean Hess, MD  ? 1 year ago Type II diabetes mellitus with complication [facility])  ? [facility] Glean Hess, MD  ? 1 year ago Annual physical exam  ? [facility] Glean Hess, MD  ? ?  ?  ?Future Appointments   ? ?        ? In 1 month Army Melia, Jesse Sans, MD [facility]  ? In 5 months Glean Hess, MD [facility], [place]  ? ?  ? ?  ?  ?  Passed - CBC within normal limits and completed in the last 12 months  ?  WBC  ?Date Value Ref Range Status  ?02/15/2021 12.8 (H) 3.4 - 10.8 x10E3/uL Final  ?10/04/2015 13.6 (H) 3.6 - 11.0 K/uL Final  ? ?RBC  ?Date Value Ref Range Status  ?02/15/2021 3.85 3.77 - 5.28 x10E6/uL Final  ?10/04/2015 4.10 3.80 - 5.20 MIL/uL Final  ? ?RBC.  ?Date Value Ref Range Status  ?10/04/2015 4.03 3.80 - 5.20 MIL/uL Final  ? ?Hemoglobin  ?Date Value Ref Range Status  ?02/15/2021 10.1 (L) 11.1 - 15.9 g/dL Final  ? ?Hematocrit  ?Date Value Ref Range Status  ?02/15/2021 31.9 (L) 34.0 - 46.6 % Final  ? ?MCHC  ?Date Value Ref Range Status  ?02/15/2021 31.7 31.5 - 35.7 g/dL Final  ?10/04/2015 32.5 32.0 - 36.0 g/dL Final  ? ?MCH  ?Date Value Ref Range Status  ?02/15/2021 26.2 (L) 26.6 - 33.0 pg Final  ?10/04/2015 27.1 26.0 - 34.0 pg Final  ? ?MCV  ?Date Value Ref Range Status  ?02/15/2021 83 79 - 97 fL Final  ? ?No results found for: PLTCOUNTKUC, LABPLAT, [place] ?RDW  ?Date Value Ref Range Status  ?02/15/2021 13.4 11.7 - 15.4 % Final  ? ?  ?  ?  ?  ?

## 2021-09-12 ENCOUNTER — Other Ambulatory Visit: Payer: Self-pay | Admitting: Orthopedic Surgery

## 2021-09-19 ENCOUNTER — Encounter: Payer: Self-pay | Admitting: Urgent Care

## 2021-09-19 ENCOUNTER — Encounter: Payer: Self-pay | Admitting: Orthopedic Surgery

## 2021-09-19 ENCOUNTER — Other Ambulatory Visit
Admission: RE | Admit: 2021-09-19 | Discharge: 2021-09-19 | Disposition: A | Discharge status: Home / Self Care | Payer: Medicare Other | Source: Ambulatory Visit | Attending: Orthopedic Surgery | Admitting: Orthopedic Surgery

## 2021-09-19 ENCOUNTER — Inpatient Hospital Stay
Admission: RE | Admit: 2021-09-19 | Discharge: 2021-09-19 | Disposition: A | Discharge status: Home / Self Care | Payer: Medicare Other | Source: Ambulatory Visit

## 2021-09-19 DIAGNOSIS — Z01812 Encounter for preprocedural laboratory examination: Secondary | ICD-10-CM | POA: Diagnosis not present

## 2021-09-19 DIAGNOSIS — Z01818 Encounter for other preprocedural examination: Secondary | ICD-10-CM

## 2021-09-19 DIAGNOSIS — L03032 Cellulitis of left toe: Secondary | ICD-10-CM | POA: Diagnosis not present

## 2021-09-19 DIAGNOSIS — D649 Anemia, unspecified: Secondary | ICD-10-CM | POA: Insufficient documentation

## 2021-09-19 DIAGNOSIS — E1142 Type 2 diabetes mellitus with diabetic polyneuropathy: Secondary | ICD-10-CM | POA: Diagnosis not present

## 2021-09-19 LAB — CBC WITH DIFFERENTIAL/PLATELET
Abs Immature Granulocytes: 0.03 10*3/uL (ref 0.00–0.07)
Basophils Absolute: 0.1 10*3/uL (ref 0.0–0.1)
Basophils Relative: 1 %
Eosinophils Absolute: 0.4 10*3/uL (ref 0.0–0.5)
Eosinophils Relative: 4 %
HCT: 32 % — ABNORMAL LOW (ref 36.0–46.0)
Hemoglobin: 9.7 g/dL — ABNORMAL LOW (ref 12.0–15.0)
Immature Granulocytes: 0 %
Lymphocytes Relative: 21 %
Lymphs Abs: 2.1 10*3/uL (ref 0.7–4.0)
MCH: 25.8 pg — ABNORMAL LOW (ref 26.0–34.0)
MCHC: 30.3 g/dL (ref 30.0–36.0)
MCV: 85.1 fL (ref 80.0–100.0)
Monocytes Absolute: 0.7 10*3/uL (ref 0.1–1.0)
Monocytes Relative: 6 %
Neutro Abs: 7.1 10*3/uL (ref 1.7–7.7)
Neutrophils Relative %: 68 %
Platelets: 294 10*3/uL (ref 150–400)
RBC: 3.76 MIL/uL — ABNORMAL LOW (ref 3.87–5.11)
RDW: 13.2 % (ref 11.5–15.5)
WBC: 10.4 10*3/uL (ref 4.0–10.5)
nRBC: 0 % (ref 0.0–0.2)

## 2021-09-19 LAB — URINALYSIS, ROUTINE W REFLEX MICROSCOPIC
Bilirubin Urine: NEGATIVE
Glucose, UA: NEGATIVE mg/dL
Hgb urine dipstick: NEGATIVE
Ketones, ur: NEGATIVE mg/dL
Leukocytes,Ua: NEGATIVE
Nitrite: NEGATIVE
Protein, ur: NEGATIVE mg/dL
Specific Gravity, Urine: 1.014 (ref 1.005–1.030)
pH: 5 (ref 5.0–8.0)

## 2021-09-19 LAB — COMPREHENSIVE METABOLIC PANEL
ALT: 11 U/L (ref 0–44)
AST: 13 U/L — ABNORMAL LOW (ref 15–41)
Albumin: 3.6 g/dL (ref 3.5–5.0)
Alkaline Phosphatase: 70 U/L (ref 38–126)
Anion gap: 10 (ref 5–15)
BUN: 19 mg/dL (ref 8–23)
CO2: 29 mmol/L (ref 22–32)
Calcium: 9.4 mg/dL (ref 8.9–10.3)
Chloride: 101 mmol/L (ref 98–111)
Creatinine, Ser: 0.98 mg/dL (ref 0.44–1.00)
GFR, Estimated: >60 mL/min (ref >60)
Glucose, Bld: 125 mg/dL — ABNORMAL HIGH (ref 70–99)
Potassium: 4.2 mmol/L (ref 3.5–5.1)
Sodium: 140 mmol/L (ref 135–145)
Total Bilirubin: 0.4 mg/dL (ref 0.3–1.2)
Total Protein: 8.1 g/dL (ref 6.5–8.1)

## 2021-09-19 LAB — SURGICAL PCR SCREEN
MRSA, PCR: NEGATIVE
Staphylococcus aureus: NEGATIVE

## 2021-09-19 LAB — TYPE AND SCREEN
ABO/RH(D): O NEG
Antibody Screen: NEGATIVE

## 2021-09-19 NOTE — Patient Instructions (Signed)
Your procedure is scheduled on: 09/28/21 Report to [place]. To find out your arrival time please call [phone] between 1PM - 3PM on 09/27/21.  Remember: Instructions that are not followed completely may result in serious medical risk, up to and including death, or upon the discretion of your surgeon and anesthesiologist your surgery may need to be rescheduled.     _X__ 1. Do not eat food after midnight the night before your procedure.                 No gum chewing or hard candies. You may drink clear liquids up to 2 hours                 before you are scheduled to arrive for your surgery- DO not drink clear                 liquids within 2 hours of the start of your surgery.                 Clear Liquids include:    Diabetics water only  __X__2.  On the morning of surgery brush your teeth with toothpaste and water, you                 may rinse your mouth with mouthwash if you wish.  Do not swallow any              toothpaste of mouthwash.     _X__ 3.  No Alcohol for 24 hours before or after surgery.   _X__ 4.  Do Not Smoke or use e-cigarettes For 24 Hours Prior to Your Surgery.                 Do not use any chewable tobacco products for at least 6 hours prior to                 surgery.  ____  5.  Bring all medications with you on the day of surgery if instructed.   __X__  6.  Notify your doctor if there is any change in your medical condition      (cold, fever, infections).     Do not wear jewelry, make-up, hairpins, clips or nail polish. Do not wear lotions, powders, or perfumes.  Do not shave body hair 48 hours prior to surgery. Men may shave face and neck. Do not bring valuables to the hospital.    [facility] is not responsible for any belongings or valuables.  Contacts, dentures/partials or body piercings may not be worn into surgery. Bring a case for your contacts, glasses or hearing aids, a denture cup will be  supplied. Leave your suitcase in the car. After surgery it may be brought to your room. For patients admitted to the hospital, discharge time is determined by your treatment team.   Patients discharged the day of surgery will not be allowed to drive home.   Please read over the following fact sheets that you were given:   MRSA Information, CHG soap, Incentive spirometer  __X__ Take these medicines the morning of surgery with A SIP OF WATER:    1. levothyroxine (SYNTHROID) 150 MCG tablet  2. pregabalin (LYRICA) 50 MG capsule  3.   4.  5.  6.  ____ Fleet Enema (as directed)   __X__ Use CHG Soap/SAGE wipes as directed  ____ Use inhalers on the day of surgery  __X__  Stop metformin/Janumet/Farxiga 2 days prior to surgery  Last dose metformin morning of 09/26/21  ____ Take 1/2 of usual insulin dose the night before surgery. No insulin the morning          of surgery.   ____ Stop Blood Thinners Coumadin/Plavix/Xarelto/Pleta/Pradaxa/Eliquis/Effient/Aspirin  on   Or contact your Surgeon, Cardiologist or Medical Doctor regarding  ability to stop your blood thinners  __X__ Stop Anti-inflammatories 7 days before surgery such as Advil, Ibuprofen, Motrin,  BC or Goodies Powder, Naprosyn, Naproxen, Aleve  STOP YOUR 81 MG ASPIRIN 3 DAYS PRIOR TO SURGERY AS INSTRUCTED, LAST DOSE 09/25/21  __X__ Stop all herbals and  supplements, fish oil or vitamins for 7 days until after surgery.    ____ Bring C-Pap to the hospital.

## 2021-09-19 NOTE — Progress Notes (Signed)
?Perioperative Services ? ?Pre-Admission/Anesthesia Testing Clinical Review ? ?Date: 09/26/21 ? ?Patient Demographics:  ?Name: Kendra Walton ?DOB:   [birth year] ?MRN:   [id number] ? ?Planned Surgical Procedure(s):  ? ? Case: [id number] Date/Time: 09/28/21 0913  ? Procedure: TOTAL KNEE ARTHROPLASTY (Right: Knee)  ? Anesthesia type: Choice  ? Pre-op diagnosis: Primary osteoarthritis of right knee M17.11  ? Location: [facility] OR ROOM 01 / [facility]  ? Surgeons: Hessie Knows, MD  ? ?NOTE: Available PAT nursing documentation and vital signs have been reviewed. Clinical nursing staff has updated patient's PMH/PSHx, current medication list, and drug allergies/intolerances to ensure comprehensive history available to assist in medical decision making as it pertains to the aforementioned surgical procedure and anticipated anesthetic course. Extensive review of available clinical information performed. [place] PMH and PSHx updated with any diagnoses/procedures that  may have been inadvertently omitted during her intake with the pre-admission testing department's nursing staff. ? ?Clinical Discussion:  ?Kendra Walton is a 71 y.o. female who is submitted for pre-surgical anesthesia review and clearance prior to her undergoing the above procedure. . Patient is a Former Smoker (40 pack years; quit 06/2012). Pertinent PMH includes: non-obstructive coronary artery calcification, severe aortic valve stenosis (s/p TAVR), CHF, HTN, HLD, T2DM, hypothyroidism, CKD-III,  absolute anemia, leukocytosis, neuropathy, RIGHT breast cancer (s/p mastectomy). ? ?Patient is followed by cardiology Ubaldo Glassing, MD). She was last seen in the cardiology clinic on 02/12/2019; notes reviewed. At the time of her clinic visit, the patient denied any chest pain, shortness of breath, PND, orthopnea, palpitations, significant peripheral edema, vertiginous symptoms, or presyncope/syncope.  Patient with a PMH significant for  cardiovascular diagnoses. ?  ?Diagnostic right/left heart catheterization performed on 08/06/2018 revealing no significant obstructive CAD; 20% stenosis of the ostial ramus intermedius. Ejection fraction reduced at 35%.  There was severe aortic valve stenosis with a mean gradient of 48.2 mmHg; AVA (VTI) = 0.58 cm?.  Mean wedge pressure 27 mmHg.  Patient referred to CVTS for consideration of AVR. ?  ?Patient underwent [facility] on 08/18/[facility] at [facility].  Procedure performed via a LEFT axillary approach.  26 mm Medtronic Corevalve Evolute placed.  ? ?Blood pressure well controlled on prescribed ACEi and diuretic therapies. She is on a statin for her HLD diagnosis and further ASCVD prevention. T2DM well controlled with diet and lifestyle modifications alone. Last HgbA1c was 6.2% when checked on 06/20/2021. Functional capacity, as defined by DASI, is documented as being >/= 4 METS. No changes were made her daily medication regimen. Patient to follow up with outpatient cardiology in 1 year.  ? ?Since that time, patient has followed up for recommended imaging only. She has not been seen back in the cardiology clinic. In preparation for her procedure, patient was sent for repeat noninvasive cardiovascular testing in order to better risk stratify her for the upcoming procedure.  ? ?Last TTE was performed on 08/17/2021 revealing normal left ventricular systolic function with mild concentric LVH. Diastolic Doppler parameters consistent with abnormal relaxation (G1DD).  Left atrium mildly enlarged.  There was trivial mitral, tricuspid, and pulmonary valve regurgitation. Prosthetic aortic valve well-seated with normal function; mean gradient 12.0 mmHg. ? ?Kendra Walton is scheduled for an elective RIGHT TOTAL KNEE ARTHROPLASTY on 09/28/2021 with Dr. Hessie Knows, MD.  Given patient's past medical history significant for cardiovascular diagnoses, presurgical cardiac clearance was sought by the PAT  team. Per cardiology, "this patient is optimized for surgery and may proceed with the planned procedural  course with a LOW risk of significant perioperative cardiovascular complications".  In review of her medication reconciliation, it is noted the patient is on daily antiplatelet therapy. She has been instructed on recommendations from her cardiologist for holding her daily full dose ASA for 5 days prior to her procedure with plans to restart as soon as postoperative bleeding risk felt to be minimized by her primary attending surgeon. The patient is aware that her last dose of ASA should be on 09/19/2021. ? ?Patient denies previous perioperative complications with anesthesia in the past. In review of the available records, it is noted that patient underwent a general anesthetic course at [facility] ([facility]) in 08/2018 without documented complications.  ? ? ?  08/03/2021  ? 12:36 PM 06/20/2021  ?  9:35 AM 02/15/2021  ?  9:11 AM  ?Vitals with BMI  ?Height '5\' 8"'$  '5\' 8"'$  '5\' 8"'$   ?Weight 235 lbs 4 oz 241 lbs 242 lbs  ?BMI 35.78 36.65 36.8  ?Systolic 993 716 92  ?Diastolic 68 78 52  ?Pulse 57 72 62  ? ? ?Providers/Specialists:  ? ?NOTE: Primary physician provider listed below. Patient may have been seen by APP or partner within same practice.  ? ?PROVIDER ROLE / SPECIALTY LAST OV  ?Hessie Knows, MD Orthopedics (Surgeon) 08/09/2021  ?Glean Hess, MD Primary Care Provider 06/20/2021  ?Bartholome Bill, MD Cardiology 02/12/2019  ?Mee Hives, MD Endocrinology 02/13/2021  ? ?Allergies:  ?Shellfish allergy ? ?Current Home Medications:  ? ?No current facility-administered medications for this encounter.  ? ? aspirin EC 81 MG tablet  ? atorvastatin (LIPITOR) 20 MG tablet  ? clobetasol cream (TEMOVATE) 0.05 %  ? hydrocortisone 2.5 % cream  ? ibuprofen (ADVIL,MOTRIN) 200 MG tablet  ? ketoconazole (NIZORAL) 2 % cream  ? levothyroxine (SYNTHROID) 150 MCG tablet  ? lisinopril-hydrochlorothiazide (ZESTORETIC)  10-12.5 MG tablet  ? metFORMIN (GLUCOPHAGE-XR) 500 MG 24 hr tablet  ? Polyvinyl Alcohol-Povidone (REFRESH OP)  ? pregabalin (LYRICA) 50 MG capsule  ? traMADol (ULTRAM) 50 MG tablet  ? Ascorbic Acid (VITAMIN C PO)  ? aspirin EC 325 MG tablet  ? Ferrous Gluconate (IRON 27 PO)  ? glucose blood (ONE TOUCH ULTRA TEST) test strip  ? Lancets (ONETOUCH DELICA PLUS [id number]) MISC  ? Multiple Vitamin (MULTIVITAMIN WITH MINERALS) TABS tablet  ? Omega-3 Fatty Acids (FISH OIL PO)  ? Pumpkin Seed-Soy Germ (AZO BLADDER CONTROL/GO-LESS) CAPS  ? vitamin B-12 (CYANOCOBALAMIN) 500 MCG tablet  ? ?History:  ? ?Past Medical History:  ?Diagnosis Date  ? Absolute anemia 09/15/2015  ? Patient declines colonoscopy   ? Adrenal nodule ([place]) 09/02/2018  ? Followed by Endocrinology - labs normal; benign with no further follow up needed  ? Aortic stenosis, moderate 04/05/2017  ? a.) s/p TAVR 08/18/2018; LEFT axillary approach  ? Arthritis   ? Breast cancer, right ([place]) 2010  ? a.) s/p RIGHT mastectomy; no chemotherpay or XRT  ? CHF (congestive heart failure) ([place])   ? a.)  TTE 07/29/2018: EF 35%; moderate global HK; moderate LVH; mild LA dilation; trivial TR/PR, mild AR/MR; moderate AS (MPG 48.2 mmHg); G2DD. b.) R/LHC 08/06/2018; EF 35%; mean PCWP 27 mmHg. c.)  TTE 10/20/2019: EF >55%; mild LVH; trivial MR/TR, mild PR, prostatic AoV. d.)  TTE 08/17/2021: EF greater than 55%; mild LVH; mild LA dilation; trivial MR/TR/PR; prostatic AoV (MPG 12 mmHg); G1DD.  ? CKD (chronic kidney disease), stage III ([place])   ? Coronary artery calcification 08/06/2018  ? a.)  R/LHC 08/06/2018: EF 35%; 20% stenosis oRI  ? Ectopic pregnancy, tubal   ? History of transcatheter aortic valve replacement (TAVR) 08/18/2018  ? a.) 26 mm Medtronic Corevalve Evolute  ? HLD (hyperlipidemia)   ? Hypertension   ? Hypothyroidism   ? Leukocytosis 09/15/2015  ? Hematology eval - likely benign; follow up planned   ? Neuropathy   ? Post-menopausal bleeding 04/13/2016  ? US showed  thickened endometrium; biopsy recommended but patient declined  ? T2DM (type 2 diabetes mellitus) ([place])   ? ?Past Surgical History:  ?Procedure Laterality Date  ? CESAREAN SECTION    ? CHOLECYSTECTOMY  2005  ? EC

## 2021-09-19 NOTE — Pre-Procedure Instructions (Signed)
Spoke to patient reviewed medications, medical and surgical history and allergies. Patient original surgery was cancelled d/t need for cardiac clearance. Surgery rescheduled for 4/13.Chart reviewed with patient and updates made as needed. New instructions printed and patient to pick up today during lab only visit. Patient denies questions at this time.

## 2021-09-20 DIAGNOSIS — M1711 Unilateral primary osteoarthritis, right knee: Secondary | ICD-10-CM | POA: Diagnosis not present

## 2021-09-25 NOTE — Pre-Procedure Instructions (Signed)
Called over to Dr Leamon Arnt office in regards to clearance. Secretary said she will send note to Dr Leamon Arnt nurse. I refaxed the cardiac clearance form again and gave office my direct # and fax # ?

## 2021-09-25 NOTE — Pre-Procedure Instructions (Signed)
Nira Conn, Dr Leamon Arnt nurse called over and states that Dr Ubaldo Glassing is not back in the office until Tuesday and he will sign clearance then ?

## 2021-09-26 NOTE — Progress Notes (Signed)
Cardiology clearance form received from Dr. Ubaldo Glassing. Patient is optimized for surgery with a low risk stratification assessment. Ok to hold aspirin 5 days prior to surgery per form. ?

## 2021-09-28 ENCOUNTER — Encounter: Admission: RE | Payer: Self-pay | Source: Home / Self Care

## 2021-09-28 ENCOUNTER — Ambulatory Visit: Admission: RE | Admit: 2021-09-28 | Payer: Medicare Other | Source: Home / Self Care | Admitting: Orthopedic Surgery

## 2021-09-28 DIAGNOSIS — Z01818 Encounter for other preprocedural examination: Secondary | ICD-10-CM

## 2021-09-28 SURGERY — ARTHROPLASTY, KNEE, TOTAL
Anesthesia: Choice | Site: Knee | Laterality: Right

## 2021-10-15 ENCOUNTER — Encounter: Payer: Self-pay | Admitting: Internal Medicine

## 2021-10-16 ENCOUNTER — Other Ambulatory Visit: Payer: Self-pay | Admitting: Internal Medicine

## 2021-10-16 DIAGNOSIS — E1142 Type 2 diabetes mellitus with diabetic polyneuropathy: Secondary | ICD-10-CM

## 2021-10-16 MED ORDER — PREGABALIN 50 MG PO CAPS: 50.0000 mg | ORAL_CAPSULE | Freq: Four times a day (QID) | ORAL | 1 refills | Status: DC

## 2021-10-18 ENCOUNTER — Ambulatory Visit: Payer: Medicare Other | Admitting: Internal Medicine

## 2021-10-18 DIAGNOSIS — R202 Paresthesia of skin: Secondary | ICD-10-CM | POA: Diagnosis not present

## 2021-10-18 DIAGNOSIS — R200 Anesthesia of skin: Secondary | ICD-10-CM | POA: Diagnosis not present

## 2021-10-25 ENCOUNTER — Other Ambulatory Visit: Payer: Self-pay | Admitting: Orthopedic Surgery

## 2021-10-25 ENCOUNTER — Ambulatory Visit
Admission: RE | Admit: 2021-10-25 | Discharge: 2021-10-25 | Disposition: A | Discharge status: Home / Self Care | Payer: Medicare Other | Source: Ambulatory Visit | Attending: Orthopedic Surgery | Admitting: Orthopedic Surgery

## 2021-10-25 DIAGNOSIS — M5126 Other intervertebral disc displacement, lumbar region: Secondary | ICD-10-CM | POA: Diagnosis not present

## 2021-10-25 DIAGNOSIS — M5431 Sciatica, right side: Secondary | ICD-10-CM

## 2021-10-25 DIAGNOSIS — M545 Low back pain, unspecified: Secondary | ICD-10-CM | POA: Diagnosis not present

## 2021-10-25 DIAGNOSIS — M48061 Spinal stenosis, lumbar region without neurogenic claudication: Secondary | ICD-10-CM | POA: Diagnosis not present

## 2021-10-27 ENCOUNTER — Other Ambulatory Visit: Payer: Self-pay

## 2021-10-27 ENCOUNTER — Emergency Department
Admission: EM | Admit: 2021-10-27 | Discharge: 2021-10-27 | Disposition: A | Discharge status: Home / Self Care | Payer: Medicare Other | Attending: Emergency Medicine | Admitting: Emergency Medicine

## 2021-10-27 DIAGNOSIS — N183 Chronic kidney disease, stage 3 unspecified: Secondary | ICD-10-CM | POA: Diagnosis not present

## 2021-10-27 DIAGNOSIS — D72829 Elevated white blood cell count, unspecified: Secondary | ICD-10-CM | POA: Insufficient documentation

## 2021-10-27 DIAGNOSIS — I130 Hypertensive heart and chronic kidney disease with heart failure and stage 1 through stage 4 chronic kidney disease, or unspecified chronic kidney disease: Secondary | ICD-10-CM | POA: Insufficient documentation

## 2021-10-27 DIAGNOSIS — I509 Heart failure, unspecified: Secondary | ICD-10-CM | POA: Insufficient documentation

## 2021-10-27 DIAGNOSIS — E1122 Type 2 diabetes mellitus with diabetic chronic kidney disease: Secondary | ICD-10-CM | POA: Diagnosis not present

## 2021-10-27 DIAGNOSIS — R7989 Other specified abnormal findings of blood chemistry: Secondary | ICD-10-CM | POA: Insufficient documentation

## 2021-10-27 DIAGNOSIS — M549 Dorsalgia, unspecified: Secondary | ICD-10-CM | POA: Diagnosis not present

## 2021-10-27 DIAGNOSIS — R6889 Other general symptoms and signs: Secondary | ICD-10-CM | POA: Diagnosis not present

## 2021-10-27 DIAGNOSIS — E039 Hypothyroidism, unspecified: Secondary | ICD-10-CM | POA: Insufficient documentation

## 2021-10-27 DIAGNOSIS — Z853 Personal history of malignant neoplasm of breast: Secondary | ICD-10-CM | POA: Insufficient documentation

## 2021-10-27 DIAGNOSIS — M5417 Radiculopathy, lumbosacral region: Secondary | ICD-10-CM | POA: Diagnosis not present

## 2021-10-27 DIAGNOSIS — R52 Pain, unspecified: Secondary | ICD-10-CM | POA: Diagnosis not present

## 2021-10-27 DIAGNOSIS — M545 Low back pain, unspecified: Secondary | ICD-10-CM | POA: Diagnosis present

## 2021-10-27 LAB — CBC WITH DIFFERENTIAL/PLATELET
Abs Immature Granulocytes: 0.03 10*3/uL (ref 0.00–0.07)
Basophils Absolute: 0.1 10*3/uL (ref 0.0–0.1)
Basophils Relative: 1 %
Eosinophils Absolute: 0.3 10*3/uL (ref 0.0–0.5)
Eosinophils Relative: 3 %
HCT: 37.3 % (ref 36.0–46.0)
Hemoglobin: 11.5 g/dL — ABNORMAL LOW (ref 12.0–15.0)
Immature Granulocytes: 0 %
Lymphocytes Relative: 36 %
Lymphs Abs: 4 10*3/uL (ref 0.7–4.0)
MCH: 25.9 pg — ABNORMAL LOW (ref 26.0–34.0)
MCHC: 30.8 g/dL (ref 30.0–36.0)
MCV: 84 fL (ref 80.0–100.0)
Monocytes Absolute: 0.9 10*3/uL (ref 0.1–1.0)
Monocytes Relative: 8 %
Neutro Abs: 5.9 10*3/uL (ref 1.7–7.7)
Neutrophils Relative %: 52 %
Platelets: 262 10*3/uL (ref 150–400)
RBC: 4.44 MIL/uL (ref 3.87–5.11)
RDW: 13.7 % (ref 11.5–15.5)
WBC: 11.2 10*3/uL — ABNORMAL HIGH (ref 4.0–10.5)
nRBC: 0 % (ref 0.0–0.2)

## 2021-10-27 LAB — BASIC METABOLIC PANEL
Anion gap: 10 (ref 5–15)
BUN: 32 mg/dL — ABNORMAL HIGH (ref 8–23)
CO2: 26 mmol/L (ref 22–32)
Calcium: 9.4 mg/dL (ref 8.9–10.3)
Chloride: 103 mmol/L (ref 98–111)
Creatinine, Ser: 1.19 mg/dL — ABNORMAL HIGH (ref 0.44–1.00)
GFR, Estimated: 48 mL/min — ABNORMAL LOW (ref >60)
Glucose, Bld: 120 mg/dL — ABNORMAL HIGH (ref 70–99)
Potassium: 3.4 mmol/L — ABNORMAL LOW (ref 3.5–5.1)
Sodium: 139 mmol/L (ref 135–145)

## 2021-10-27 MED ORDER — ONDANSETRON HCL 4 MG/2ML IJ SOLN
4.0000 mg | Freq: Once | INTRAMUSCULAR | Status: AC
  Administered 2021-10-27: 4 mg via INTRAVENOUS
  Filled 2021-10-27: qty 2

## 2021-10-27 MED ORDER — KETOROLAC TROMETHAMINE 30 MG/ML IJ SOLN
15.0000 mg | Freq: Once | INTRAMUSCULAR | Status: AC
  Administered 2021-10-27: 15 mg via INTRAVENOUS
  Filled 2021-10-27: qty 1

## 2021-10-27 MED ORDER — HYDROMORPHONE HCL 2 MG PO TABS: 2.0000 mg | ORAL_TABLET | ORAL | 0 refills | Status: DC | PRN

## 2021-10-27 MED ORDER — DEXAMETHASONE SODIUM PHOSPHATE 10 MG/ML IJ SOLN
10.0000 mg | Freq: Once | INTRAMUSCULAR | Status: AC
  Administered 2021-10-27: 10 mg via INTRAVENOUS
  Filled 2021-10-27: qty 1

## 2021-10-27 MED ORDER — LIDOCAINE 5 % EX PTCH
1.0000 | MEDICATED_PATCH | Freq: Once | CUTANEOUS | Status: DC
  Administered 2021-10-27: 1 via TRANSDERMAL
  Filled 2021-10-27: qty 1

## 2021-10-27 MED ORDER — HYDROMORPHONE HCL 1 MG/ML IJ SOLN
1.0000 mg | Freq: Once | INTRAMUSCULAR | Status: AC
  Administered 2021-10-27: 1 mg via INTRAVENOUS
  Filled 2021-10-27: qty 1

## 2021-10-27 MED ORDER — LIDOCAINE 5 % EX PTCH: 1.0000 | MEDICATED_PATCH | CUTANEOUS | 0 refills | Status: AC

## 2021-10-27 NOTE — ED Notes (Signed)
Pt lying in bed and husband at bedside. Pt states that pain is well controlled at this time but she needs to speak with provider before discharge because her pain med regimen from PCP is not adequate to ether through to upcoming appt on Monday (in 3 days). Pt was able to get out of bed and walk without assistance. States that she usually gets severe pain after walking longer distances. ?

## 2021-10-27 NOTE — ED Notes (Signed)
Patient sitting up in bed.  Will attempt to ambulate shortly ?

## 2021-10-27 NOTE — ED Triage Notes (Signed)
Patient BIB EMS for evaluation of back pain.  Pain started last month.  Dx with herniated disc.  No new injuries.  No new numbness or tingling. Able to stand and ambulate to bed from stretcher. ?

## 2021-10-27 NOTE — ED Provider Notes (Signed)
I assumed care of this patient at 0 700.  Please see outgoing fighters note for full details of patient's initial evaluation assessment.  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  She has an appointment on 5/15 with neurosurgery.  He has a couple different pain prescriptions at this time including 2 mg of Dilaudid every 4 hours and Norco.  She is confused about this but states she does not have enough Dilaudid to get through the weekend using every 4 hours.  We will give her a couple additional doses that she is asked to use every 4 hours again counseled her on avoiding antipsychotic as well as medication so started on regimen.  Discharged stable condition.  Strict medications advised and discussed. ?  ?Lucrezia Starch, MD ?10/27/21 0730 ? ?

## 2021-10-27 NOTE — Discharge Instructions (Signed)
Please follow-up with Dr. Izora Ribas with neurosurgery as scheduled.  Please continue your pain medication as prescribed. ?

## 2021-10-27 NOTE — ED Provider Notes (Signed)
? ?[facility] ?Provider Note ? ? ? Event Date/Time  ? First MD Initiated Contact with Patient 10/27/21 0547   ?  (approximate) ? ? ?History  ? ?Back Pain ? ? ?HPI ? ?Kendra Walton is a 71 y.o. female with history of CHF, aortic stenosis status post aortic valve replacement, hypertension, hyperlipidemia, hypothyroidism, diabetes who presents to the emergency department complaints of lower back pain.  States the symptoms started on April 8.  She is being followed by Dr. 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  There is also mild spinal stenosis at L4-L5.  She states that since her symptoms started in April she has had numbness and tingling down the right leg.  She states that she has been taking oral Dilaudid at home with good pain relief but woke up this morning with excruciating back pain and difficulty moving.  States she took a 2 mg oral Dilaudid prior to arrival with minimal relief.  She denies any new numbness, tingling or weakness.  No bowel or bladder incontinence.  No urinary retention.  No fever.  No previous back surgeries or epidural injections.  States she is supposed to see Dr. Izora Ribas with neurosurgery as an outpatient.  Patient brought into the emergency department with EMS. ? ? ?History provided by patient and EMS. ? ? ? ?Past Medical History:  ?Diagnosis Date  ? Absolute anemia 09/15/2015  ? Patient declines colonoscopy   ? Adrenal nodule ([place]) 09/02/2018  ? Followed by Endocrinology - labs normal; benign with no further follow up needed  ? Aortic stenosis, moderate 04/05/2017  ? a.) s/p TAVR 08/18/2018; LEFT axillary approach  ? Arthritis   ? Breast cancer, right ([place]) 2010  ? a.) s/p RIGHT mastectomy; no chemotherpay or XRT  ? CHF (congestive heart failure) ([place])   ? a.)  TTE 07/29/2018: EF 35%;  moderate global HK; moderate LVH; mild LA dilation; trivial TR/PR, mild AR/MR; moderate AS (MPG 48.2 mmHg); G2DD. b.) R/LHC 08/06/2018; EF 35%; mean PCWP 27 mmHg. c.)  TTE 10/20/2019: EF >55%; mild LVH; trivial MR/TR, mild PR, prostatic AoV. d.)  TTE 08/17/2021: EF greater than 55%; mild LVH; mild LA dilation; trivial MR/TR/PR; prostatic AoV (MPG 12 mmHg); G1DD.  ? CKD (chronic kidney disease), stage III ([place])   ? Coronary artery calcification 08/06/2018  ? a.) R/LHC 08/06/2018: EF 35%; 20% stenosis oRI  ? Ectopic pregnancy, tubal   ? History of transcatheter aortic valve replacement (TAVR) 08/18/2018  ? a.) 26 mm Medtronic Corevalve Evolute  ? HLD (hyperlipidemia)   ? Hypertension   ? Hypothyroidism   ? Leukocytosis 09/15/2015  ? Hematology eval - likely benign; follow up planned   ? Neuropathy   ? Post-menopausal bleeding 04/13/2016  ? US showed thickened endometrium; biopsy recommended but patient declined  ? T2DM (type 2 diabetes mellitus) ([place])   ? ? ?Past Surgical History:  ?Procedure Laterality Date  ? CESAREAN SECTION    ? CHOLECYSTECTOMY  2005  ? ECTOPIC PREGNANCY SURGERY    ? MASTECTOMY, PARTIAL Right 2010  ? REDUCTION MAMMAPLASTY Left 2011  ? RIGHT/LEFT HEART CATH AND CORONARY ANGIOGRAPHY Bilateral 08/06/2018  ? Procedure: RIGHT/LEFT HEART CATH AND CORONARY ANGIOGRAPHY;  Surgeon: Teodoro Spray, MD;  Location: [facility] CV LAB;  Service: Cardiovascular;  Laterality: Bilateral;  ? TRANSCATHETER AORTIC VALVE REPLACEMENT, TRANSAPICAL  08/18/2018  ? Procedure: TRANSCATHETER AORTIC VALVE REPLACEMENT; Location: [facility]; Surgeon: Sharmon Leyden, MD  ? ? ?MEDICATIONS:  ?Prior to Admission medications   ?Medication Sig Start Date End Date Taking? Authorizing Provider  ?Ascorbic Acid (VITAMIN C PO) Take 1 tablet by mouth daily.    [provider]  ?aspirin EC 325 MG tablet Take 650-975 mg by mouth every 6 (six) hours as needed for moderate pain.    [provider]  ?aspirin EC 81 MG tablet Take 81  mg by mouth daily.    [provider]  ?atorvastatin (LIPITOR) 20 MG tablet Take 1 tablet (20 mg total) by mouth at bedtime. 08/27/21   Glean Hess, MD  ?clobetasol cream (TEMOVATE) 0.05 % Apply topically 2 (two) times daily. 04/06/21   [provider]  ?Ferrous Gluconate (IRON 27 PO) Take 27 mg by mouth daily.    [provider]  ?glucose blood (ONE TOUCH ULTRA TEST) test strip Use to test BS twice a day 08/25/19   Glean Hess, MD  ?hydrocortisone 2.5 % cream Apply 1 application topically daily as needed (eczema). 12/23/19   [provider]  ?ibuprofen (ADVIL,MOTRIN) 200 MG tablet Take 600-800 mg by mouth every 6 (six) hours as needed for headache or moderate pain.    [provider]  ?ketoconazole (NIZORAL) 2 % cream Apply 1 application topically daily as needed (eczema). 12/24/19   [provider]  ?Lancets (ONETOUCH DELICA PLUS [id number]) [place] USE [place] 03/18/20   Glean Hess, MD  ?levothyroxine (SYNTHROID) 150 MCG tablet Take 75-150 mcg by mouth See admin instructions. Take 150 mcg daily except Sundays take 75 mcg 06/21/20   [provider]  ?lisinopril-hydrochlorothiazide (ZESTORETIC) 10-12.5 MG tablet Take 1 tablet by mouth daily. 08/27/21   Glean Hess, MD  ?metFORMIN (GLUCOPHAGE-XR) 500 MG 24 hr tablet TAKE 2 TABLETS(1000 MG) BY MOUTH TWICE DAILY 09/06/21   Glean Hess, MD  ?Multiple Vitamin (MULTIVITAMIN WITH MINERALS) TABS tablet Take 1 tablet by mouth daily.    [provider]  ?Omega-3 Fatty Acids (FISH OIL PO) Take 1 tablet by mouth daily.    [provider]  ?Polyvinyl Alcohol-Povidone (REFRESH OP) Place 1 drop into both eyes daily.    [provider]  ?pregabalin (LYRICA) 50 MG capsule Take 1 capsule (50 mg total) by mouth 4 (four) times daily. 10/16/21   Glean Hess, MD  ?Alfredia Ferguson Germ (AZO BLADDER CONTROL/GO-LESS) CAPS Take 1 capsule by mouth 2 (two) times  daily. ?Patient taking differently: Take 1 capsule by mouth daily. 05/07/19   Glean Hess, MD  ?traMADol Veatrice Bourbon) 50 MG tablet Take 2 tablets (100 mg total) by mouth every 6 (six) hours as needed. ?Patient taking differently: Take 100 mg by mouth 3 (three) times daily. 07/28/21   Glean Hess, MD  ?vitamin B-12 (CYANOCOBALAMIN) 500 MCG tablet Take 500 mcg by mouth daily.    [provider]  ? ? ?Physical Exam  ? ?Triage Vital Signs: ?ED Triage Vitals  ?Enc Vitals Group  ?   BP 10/27/21 0545 (!) 144/95  ?   Pulse Rate 10/27/21 0545 93  ?   Resp 10/27/21 0545 20  ?   Temp 10/27/21 0545 97.9 ?F (36.6 ?C)  ?   Temp Source 10/27/21 0545 Oral  ?   SpO2 10/27/21 0545 99 %  ?   Weight 10/27/21 0547 235 lb (106.6 kg)  ?   Height 10/27/21 0547  $'5\' 8"'Y$  (1.727 m)  ?   Head Circumference --   ?   Peak Flow --   ?   Pain Score 10/27/21 0546 5  ?   Pain Loc --   ?   Pain Edu? --   ?   Excl. in [place]? --   ? ? ?Most recent vital signs: ?Vitals:  ? 10/27/21 0545  ?BP: (!) 144/95  ?Pulse: 93  ?Resp: 20  ?Temp: 97.9 ?F (36.6 ?C)  ?SpO2: 99%  ? ? ?CONSTITUTIONAL: Alert and oriented and responds appropriately to questions. Well-appearing; well-nourished ?HEAD: Normocephalic, atraumatic ?EYES: Conjunctivae clear, pupils appear equal, sclera nonicteric ?ENT: normal nose; moist mucous membranes ?NECK: Supple, normal ROM ?CARD: RRR; S1 and S2 appreciated; + murmur, no clicks, no rubs, no gallops ?RESP: Normal chest excursion without splinting or tachypnea; breath sounds clear and equal bilaterally; no wheezes, no rhonchi, no rales, no hypoxia or respiratory distress, speaking full sentences ?ABD/GI: Normal bowel sounds; non-distended; soft, non-tender, no rebound, no guarding, no peritoneal signs ?BACK: The back appears normal, tender to palpation over the lower lumbar spine without step-off or deformity, no redness or warmth, no ecchymosis or soft tissue abnormalities, no rash ?EXT: Normal ROM in all joints; no deformity  noted, no edema; no cyanosis ?SKIN: Normal color for age and race; warm; no rash on exposed skin ?NEURO: Moves all extremities equally, normal speech, reports normal sensation diffusely, difficult to test strength due to

## 2021-10-30 DIAGNOSIS — M5416 Radiculopathy, lumbar region: Secondary | ICD-10-CM | POA: Diagnosis not present

## 2021-10-31 ENCOUNTER — Other Ambulatory Visit: Payer: Self-pay | Admitting: Neurosurgery

## 2021-10-31 DIAGNOSIS — Z01818 Encounter for other preprocedural examination: Secondary | ICD-10-CM

## 2021-11-03 ENCOUNTER — Other Ambulatory Visit: Payer: Self-pay

## 2021-11-03 ENCOUNTER — Encounter: Payer: Self-pay | Admitting: Neurosurgery

## 2021-11-03 ENCOUNTER — Encounter
Admission: RE | Admit: 2021-11-03 | Discharge: 2021-11-03 | Disposition: A | Discharge status: Home / Self Care | Payer: Medicare Other | Source: Ambulatory Visit | Attending: Neurosurgery | Admitting: Neurosurgery

## 2021-11-03 VITALS — Ht 68.0 in | Wt 221.0 lb

## 2021-11-03 DIAGNOSIS — E1122 Type 2 diabetes mellitus with diabetic chronic kidney disease: Secondary | ICD-10-CM

## 2021-11-03 DIAGNOSIS — N1831 Chronic kidney disease, stage 3a: Secondary | ICD-10-CM

## 2021-11-03 DIAGNOSIS — Z01818 Encounter for other preprocedural examination: Secondary | ICD-10-CM

## 2021-11-03 NOTE — Progress Notes (Addendum)
  Perioperative Services Pre-Admission/Anesthesia Testing    Date: 11/03/21  Name: Kendra Walton MRN:   [id number]  Re: Enlarging RIGHT adrenal mass  Planned Surgical Procedure(s):    Case: [id number] Date/Time: 11/08/21 0700   Procedure: RIGHT L5-S1 MICRODISCECTOMY (Right)   Anesthesia type: General   Pre-op diagnosis:      m54.16 lumbar radiculopathy     r29.898 right leg weakness   Location: [facility] OR ROOM 03 / [place] [facility]S FOR ANESTHESIA GROUP   Surgeons: Meade Maw, MD   Clinical Notes:  Patient is scheduled for the above procedure on 11/08/2021 with Dr. Meade Maw, MD.  During pre-anesthesia review of medical record for patient's upcoming surgery, it was noted that patient has had a known right adrenal mass since 07/2018.  Adrenal mass, as per 02/06/2019 CT scan of the abdomen, at 4.7 x 3.7 cm (note, measurements slightly different across dictated reports). Considerations at that time remained lipid poor adenoma, metastatic disease (history of RIGHT breast cancer), and primary adrenal adenoma.  Consideration of PET CT or tissue sampling was recommended. Medical record indicated that patient was being followed by endocrinology, labs were normal, and area was considered to be benign, therefore no follow-up was indicated.    With that being said, recent MRI imaging of the lumbar spine demonstrated interval increase in the RIGHT adrenal mass to 7.1 x 7.1 cm.  EMR has been updated to reflect progressive measurements of the adrenal mass across all imaging modalities.  Radiology noted that "cystic and solid" mass was favored given its interval growth, and recommended a dedicated adrenal protocol MRI to further assess.  Sending notification to patient's PCP Army Melia, MD), endocrinologist Honor Junes, MD), and neurosurgeon Izora Ribas, MD) to make them aware of recent findings to determine need for further evaluation and work-up.  Honor Loh, MSN, APRN, FNP-C, CEN [facility]  Peri-operative Services Nurse Practitioner Phone: [phone] 11/03/21 11:26 AM  NOTE: This note has been prepared using Dragon dictation software. Despite my best ability to proofread, there is always the potential that unintentional transcriptional errors may still occur from this process.

## 2021-11-03 NOTE — Progress Notes (Signed)
Perioperative Services  Pre-Admission/Anesthesia Testing Clinical Review  Date: 11/03/21  Patient Demographics:  Name: Kendra Walton DOB:   1950/07/26 MRN:   [id number]  Planned Surgical Procedure(s):    Case: [id number] Date/Time: 11/08/21 0700   Procedure: RIGHT L5-S1 MICRODISCECTOMY (Right)   Anesthesia type: General   Pre-op diagnosis:      m54.16 lumbar radiculopathy     r29.898 right leg weakness   Location: [facility] OR ROOM 03 / [place] [facility] ANEST[facility]   Surgeons: Meade Maw, MD   NOTE: Available PAT nursing documentation and vital signs have been reviewed. Clinical nursing staff has updated patient's PMH/PSHx, current medication list, and drug allergies/intolerances to ensure comprehensive history available to assist in medical decision making as it pertains to the aforementioned surgical procedure and anticipated anesthetic course. Extensive review of available clinical information performed. [place] PMH and PSHx updated with any diagnoses/procedures that  may have been inadvertently omitted during her intake with the pre-admission testing department's nursing staff.  Clinical Discussion:  Kendra Walton is a 71 y.o. female who is submitted for pre-surgical anesthesia review and clearance prior to her undergoing the above procedure. . Patient is a Former Smoker (40 pack years; quit 06/2012). Pertinent PMH includes: non-obstructive coronary artery calcification, severe aortic valve stenosis (s/p TAVR), CHF, HTN, HLD, T2DM, hypothyroidism, CKD-III,  absolute anemia, leukocytosis, neuropathy, RIGHT breast cancer (s/p mastectomy), enlarging RIGHT adrenal mass.    Patient is followed by cardiology Ubaldo Glassing, MD). She was last seen in the cardiology clinic on 02/12/2019; notes reviewed. At the time of her clinic visit, the patient denied any chest pain, shortness of breath, PND, orthopnea, palpitations, significant peripheral edema, vertiginous symptoms, or  presyncope/syncope.  Patient with a PMH significant for cardiovascular diagnoses.   Diagnostic right/left heart catheterization performed on 08/06/2018 revealing no significant obstructive CAD; 20% stenosis of the ostial ramus intermedius. Ejection fraction reduced at 35%.  There was severe aortic valve stenosis with a mean gradient of 48.2 mmHg; AVA (VTI) = 0.58 cm. Mean PA = 36 mmHg, mean PCWP = 27 mmHg, PVR 1.78 WU, CO 5.06 L/min, CI 2.25 L/min/m. Patient referred to CVTS for consideration of AVR.   Patient underwent TAV[facility] on 08/18/2018 at [facility].  Procedure performed via a LEFT axillary approach.  26 mm Medtronic Corevalve Evolute placed.   Blood pressure  mildly elevated at 148/77 on prescribed ACEi and diuretic therapies. She is on a statin for her HLD diagnosis and further ASCVD prevention. T2DM well controlled with diet and lifestyle modifications alone. Last HgbA1c was 6.2% when checked on 06/20/2021. Functional capacity, as defined by DASI, is documented as being >/= 4 METS. No changes were made her daily medication regimen. Patient to follow up with outpatient cardiology in 1 year.   Since that time, patient has followed up for recommended imaging only. She has not been seen back in the cardiology clinic. In preparation for her procedure, patient was sent for repeat noninvasive cardiovascular testing in order to better risk stratify her for the upcoming procedure.   Last TTE was performed on 08/17/2021 revealing normal left ventricular systolic function with mild concentric LVH. Diastolic Doppler parameters consistent with abnormal relaxation (G1DD).  Left atrium mildly enlarged.  There was trivial mitral, tricuspid, and pulmonary valve regurgitation. Prosthetic aortic valve well-seated with normal function; mean gradient 12.0 mmHg.  Kendra Walton is scheduled for an elective RIGHT L5-S1 MICRODISCECTOMY on 11/08/2021 with Dr. Meade Maw, MD.  Given  patient's past medical  history significant for cardiovascular diagnoses, presurgical cardiac clearance was sought by the PAT team. Per cardiology, "this patient is optimized for surgery and may proceed with the planned procedural course with a LOW risk of significant perioperative cardiovascular complications".  In review of her medication reconciliation, it is noted the patient is on daily antiplatelet therapy. Spoke with surgeon's office to discuss plans for daily ASA dose. Given cardiovascular history, Dr. Izora Ribas is fine with patient continuing her ASA dose throughout her perioperative course.   Patient denies previous perioperative complications with anesthesia in the past. In review of the available records, it is noted that patient underwent a general anesthetic course at [facility] ([facility]) in 08/2018 without documented complications.      11/03/2021   10:14 AM 10/27/2021    8:00 AM 10/27/2021    6:30 AM  Vitals with BMI  Height 5' 8"     Weight 221 lbs    BMI 05.39    Systolic  767 341  Diastolic  66 55  Pulse  83 86    Providers/Specialists:   NOTE: Primary physician provider listed below. Patient may have been seen by APP or partner within same practice.   PROVIDER ROLE / SPECIALTY LAST Dola Factor, MD Neurosurgery (Surgeon) 10/30/2021  Glean Hess, MD Primary Care Provider 06/20/2021  Bartholome Bill, MD Cardiology 02/12/2019  Mee Hives, MD Endocrinology 02/13/2021   Allergies:  Shellfish allergy  Current Home Medications:   No current facility-administered medications for this encounter.    Ascorbic Acid (VITAMIN C PO)   aspirin EC 325 MG tablet   aspirin EC 81 MG tablet   atorvastatin (LIPITOR) 20 MG tablet   clobetasol cream (TEMOVATE) 0.05 %   Ferrous Gluconate (IRON 27 PO)   glucose blood (ONE TOUCH ULTRA TEST) test strip   HYDROcodone-acetaminophen (NORCO) 7.5-325 MG tablet   hydrocortisone 2.5 % cream    HYDROmorphone (DILAUDID) 2 MG tablet   ibuprofen (ADVIL,MOTRIN) 200 MG tablet   ketoconazole (NIZORAL) 2 % cream   Lancets (ONETOUCH DELICA PLUS [id number]) MISC   levothyroxine (SYNTHROID) 150 MCG tablet   lidocaine (LIDODERM) 5 %   lisinopril-hydrochlorothiazide (ZESTORETIC) 10-12.5 MG tablet   metFORMIN (GLUCOPHAGE-XR) 500 MG 24 hr tablet   methocarbamol (ROBAXIN) 750 MG tablet   Multiple Vitamin (MULTIVITAMIN WITH MINERALS) TABS tablet   Omega-3 Fatty Acids (FISH OIL PO)   Polyvinyl Alcohol-Povidone (REFRESH OP)   predniSONE (DELTASONE) 10 MG tablet   pregabalin (LYRICA) 50 MG capsule   Pumpkin Seed-Soy Germ (AZO BLADDER CONTROL/GO-LESS) CAPS   tizanidine (ZANAFLEX) 2 MG capsule   vitamin B-12 (CYANOCOBALAMIN) 500 MCG tablet   History:   Past Medical History:  Diagnosis Date   Absolute anemia 09/15/2015   Patient declines colonoscopy    Aortic stenosis, moderate 04/05/2017   a.) s/p TAVR 08/18/2018; LEFT axillary approach   Arthritis    Breast cancer, right ([place]) 2010   a.) s/p RIGHT mastectomy; no chemotherpay or XRT   CAD (coronary artery disease) 08/06/2018   a.) R/LHC 08/06/2018: EF 35%; 20% stenosis oRI; severe AS (MPG 48.2 mmHg; AVA (VTI) = 0.58 cm); mean PA = 36 mmHg, mean PCWP = 27 mmHg, PVR 1.78 WU, CO 5.06 L/min, CI 2.25 L/min/m   CHF (congestive heart failure) (HCC)    a.)  TTE 07/29/2018: EF 35%; moderate global HK; moderate LVH; mild LA dilation; trivial TR/PR, mild AR/MR; moderate AS (MPG 48.2 mmHg); G2DD. b.) R/LHC 08/06/2018; EF 35%; mean PCWP 27  mmHg. c.)  TTE 10/20/2019: EF >55%; mild LVH; trivial MR/TR, mild PR, prostatic AoV. d.)  TTE 08/17/2021: EF >55%; mild LVH; mild LA dilation; trivial MR/TR/PR; prostatic AoV (MPG 12 mmHg); G1DD.   CKD (chronic kidney disease), stage III (HCC)    Ectopic pregnancy, tubal    History of transcatheter aortic valve replacement (TAVR) 08/18/2018   a.) 26 mm Medtronic Corevalve Evolute   HLD (hyperlipidemia)     Hypertension    Hypothyroidism    Leukocytosis 09/15/2015   Hematology eval - likely benign; follow up planned    Neuropathy    Post-menopausal bleeding 04/13/2016   US showed thickened endometrium; biopsy recommended but patient declined   Right adrenal mass ([place]) 02/06/2019   a.) CT CAP 08/14/2018: measured 4.3 x 3.3 cm. b.) CT abd 02/06/2019: measured 4.7 x 3.7 cm. c.) MRI lumbar spine 10/25/2021: interval increase to 7.1 x 7.1 cm.   T2DM (type 2 diabetes mellitus) ([place])    Past Surgical History:  Procedure Laterality Date   CESAREAN SECTION     CHOLECYSTECTOMY  2005   ECTOPIC PREGNANCY SURGERY     MASTECTOMY, PARTIAL Right 2010   REDUCTION MAMMAPLASTY Left 2011   RIGHT/LEFT HEART CATH AND CORONARY ANGIOGRAPHY Bilateral 08/06/2018   Procedure: RIGHT/LEFT HEART CATH AND CORONARY ANGIOGRAPHY;  Surgeon: Teodoro Spray, MD;  Location: [facility];  Service: Cardiovascular;  Laterality: Bilateral;   TRANSCATHETER AORTIC VALVE REPLACEMENT, TRANSAPICAL  08/18/2018   Procedure: TRANSCATHETER AORTIC VALVE REPLACEMENT; Location: [facility]; Surgeon: Sharmon Leyden, MD   Family History  Problem Relation Age of Onset   Hypertension Brother    Diabetes Brother    Breast cancer Neg Hx    Social History   Tobacco Use   Smoking status: Former    Packs/day: 1.00    Years: 40.00    Pack years: 40.00    Types: Cigarettes    Quit date: 06/18/2012    Years since quitting: 9.3   Smokeless tobacco: Never  Vaping Use   Vaping Use: Never used  Substance Use Topics   Alcohol use: No    Alcohol/week: 0.0 standard drinks   Drug use: No    Pertinent Clinical Results:  LABS: Labs reviewed: Acceptable for surgery. Lab Results  Component Value Date   WBC 11.2 (H) 10/27/2021   HGB 11.5 (L) 10/27/2021   HCT 37.3 10/27/2021   MCV 84.0 10/27/2021   PLT 262 10/27/2021    Lab Results  Component Value Date   NA 139 10/27/2021   K 3.4 (L) 10/27/2021   CO2 26 10/27/2021   GLUCOSE 120 (H)  10/27/2021   BUN 32 (H) 10/27/2021   CREATININE 1.19 (H) 10/27/2021   CALCIUM 9.4 10/27/2021   EGFR 53 (L) 06/20/2021   GFRNONAA 48 (L) 10/27/2021   Lab Results  Component Value Date   HGBA1C 6.2 (H) 06/20/2021     Date: 08/03/2021 Time ECG obtained: 1258 PM Rate: 58 bpm Rhythm:  Sinus bradycardia with sinus arrhythmia Axis (leads I and aVF): Normal Intervals: PR 184 ms. QRS 112 ms. QTc 420 ms. ST segment and T wave changes: No evidence of acute ST segment elevation or depression.  Evidence of an age undetermined anterior infarct present Comparison: Last tracing on 08/19/2018 showed normal sinus rhythm with a nonspecific IVCD; LVH with repolarization abnormality; prolonged QT.    IMAGING / PROCEDURES: MR LUMBAR SPINE WO CONTRAST performed on 10/25/2021 L5-S1 right subarticular disc extrusion with caudal migration, which contacts and compresses the  descending right S1 nerve roots. At this level there is also mild right neural foraminal narrowing. L4-L5 mild spinal canal stenosis. Severe facet arthropathy at this level canal so be a cause of back pain 7.1 cm right adrenal mass, which previously measured up to 4.8 cm on 02/09/2019. A cystic and solid mass is favored given its interval growth, and a dedicated adrenal protocol MRI is recommended.  TRANSTHORACIC ECHOCARDIOGRAM performed on 08/17/2021 LVEF >55% Normal left ventricular systolic function with mild LVH LA mildly enlarged Diastolic Doppler parameters consistent with abnormal relaxation (G1DD). 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 Trace knee joint effusion  MR KNEE RIGHT WO CONTRAST performed on 05/10/2021 Tears of the medial meniscus body and posterior horn. Degenerative changes of the medial compartment and patellofemoral joint as described. Small joint effusion.  DIAGNOSTIC RADIOGRAPHS OF RIGHT KNEE 4+ VIEWS performed on 04/25/2021 Medial compartment narrowing with no complete bone-on-bone pathology The PA flexed view shows more narrowing medially with still no bone-on-bone pathology   RIGHT/LEFT HEART CATHETERIZATION AND CORONARY ANGIOGRAPHY performed on 08/06/2018 Nonobstructive CAD 20% stenosis of the ostial ramus intermedius LVEF 35% Severe aortic valve stenosis with a mean pressure gradient of 48.2 mmHg; AVA (VTI) = 0.58 cm Mean pulmonary wedge pressure 27 mmHg Refer to CVTS for consideration of AVR  Impression and Plan:  Kendra Walton has been referred for pre-anesthesia review and clearance prior to her undergoing the planned anesthetic and procedural courses. Available labs, pertinent testing, and imaging results were personally reviewed by me. This patient has been appropriately cleared by cardiology with an overall LOW risk of significant perioperative cardiovascular complications.  Based on clinical review performed today (11/03/21), barring any significant acute changes in the patient's overall condition, it is anticipated that she will be able to proceed with the planned surgical intervention. Any acute changes in clinical condition may necessitate her procedure being postponed and/or cancelled. Patient will meet with anesthesia team (MD and/or CRNA) on the day of her procedure for preoperative evaluation/assessment. Questions regarding anesthetic course will be fielded at that time.   Pre-surgical instructions were reviewed with the patient during her PAT appointment and questions were fielded by PAT clinical staff. Patient was advised that if any questions or concerns arise  prior to her procedure then she should return a call to PAT and/or her surgeon's office to discuss.  Honor Loh, MSN, APRN, FNP-C, CEN [facility]  Peri-operative Services Nurse Practitioner Phone: [phone] Fax: [phone] 11/03/21 12:30 PM  NOTE: This note has been prepared using Dragon dictation software. Despite my best ability to proofread, there is always the potential that unintentional transcriptional errors may still occur from this process.

## 2021-11-03 NOTE — Patient Instructions (Addendum)
Your procedure is scheduled on: Wednesday 11/08/21 Report to the Registration Desk on the 1st floor of the [facility]. To find out your arrival time, please call [phone] between 1PM - 3PM on: Tuesday 11/07/21 If your arrival time is 6:00 am, do not arrive prior to that time as the [facility] entrance doors do not open until 6:00 am.  REMEMBER: Instructions that are not followed completely may result in serious medical risk, up to and including death; or upon the discretion of your surgeon and anesthesiologist your surgery may need to be rescheduled.  Do not eat food after midnight the night before surgery.  No gum chewing, lozengers or hard candies.  You may however, drink water liquids up to 2 hours before you are scheduled to arrive for your surgery. Do not drink anything within 2 hours of your scheduled arrival time.  TAKE THESE MEDICATIONS THE MORNING OF SURGERY WITH A SIP OF WATER: levothyroxine (SYNTHROID) 150 MCG tablet pregabalin (LYRICA) 50 MG capsule   Hold your metFORMIN (GLUCOPHAGE-XR) 500 MG 24 hr tablet 2 days prior to surgery. Last dose on Sunday 11/05/21.  One week prior to surgery: Stop Anti-inflammatories (NSAIDS) such as Advil, Aleve, Ibuprofen, Motrin, Naproxen, Naprosyn and Aspirin based products such as Excedrin, Goodys Powder, BC Powder.  Stop taking  ANY OVER THE COUNTER supplements until after surgery.  You may however, continue to take Tylenol if needed for pain up until the day of surgery.  No Alcohol for 24 hours before or after surgery.  No Smoking including e-cigarettes for 24 hours prior to surgery.  No chewable tobacco products for at least 6 hours prior to surgery.  No nicotine patches on the day of surgery.  Do not use any "recreational" drugs for at least a week prior to your surgery.  Please be advised that the combination of cocaine and anesthesia may have negative outcomes, up to and including death. If you test positive for cocaine,  your surgery will be cancelled.  On the morning of surgery brush your teeth with toothpaste and water, you may rinse your mouth with mouthwash if you wish. Do not swallow any toothpaste or mouthwash.  Use CHG Soap as directed on instruction sheet.  Do not wear jewelry, make-up, hairpins, clips or nail polish.  Do not wear lotions, powders, or perfumes.   Do not shave body from the neck down 48 hours prior to surgery just in case you cut yourself which could leave a site for infection.  Also, freshly shaved skin may become irritated if using the CHG soap.  Do not bring valuables to the hospital. [facility] is not responsible for any missing/lost belongings or valuables.   Notify your doctor if there is any change in your medical condition (cold, fever, infection).  Wear comfortable clothing (specific to your surgery type) to the hospital.  After surgery, you can help prevent lung complications by doing breathing exercises.  Take deep breaths and cough every 1-2 hours.   If you are being discharged the day of surgery, you will not be allowed to drive home. You will need a responsible adult (18 years or older) to drive you home and stay with you that night.   If you are taking public transportation, you will need to have a responsible adult (18 years or older) with you. Please confirm with your physician that it is acceptable to use public transportation.   Please call the [facility] Dept. at [phone] if you have any  questions about these instructions.  Surgery Visitation Policy:  Patients undergoing a surgery or procedure may have two family members or support persons with them as long as the person is not COVID-19 positive or experiencing its symptoms.   Inpatient Visitation:    Visiting hours are 7 a.m. to 8 p.m. Up to four visitors are allowed at one time in a patient room, including children. The visitors may rotate out with other people during the day. One  designated support person (adult) may remain overnight.

## 2021-11-07 MED ORDER — CHLORHEXIDINE GLUCONATE 0.12 % MT SOLN
15.0000 mL | Freq: Once | OROMUCOSAL | Status: AC
  Administered 2021-11-08: 15 mL via OROMUCOSAL

## 2021-11-07 MED ORDER — FAMOTIDINE 20 MG PO TABS: 20.0000 mg | ORAL_TABLET | Freq: Once | ORAL | Status: DC

## 2021-11-07 MED ORDER — SODIUM CHLORIDE 0.9 % IV SOLN: INTRAVENOUS | Status: DC

## 2021-11-07 MED ORDER — ORAL CARE MOUTH RINSE: 15.0000 mL | Freq: Once | OROMUCOSAL | Status: AC

## 2021-11-07 MED ORDER — FAMOTIDINE 20 MG PO TABS
20.0000 mg | ORAL_TABLET | Freq: Once | ORAL | Status: AC
  Administered 2021-11-08: 20 mg via ORAL

## 2021-11-08 ENCOUNTER — Ambulatory Visit
Admission: RE | Admit: 2021-11-08 | Discharge: 2021-11-08 | Disposition: A | Discharge status: Home / Self Care | Payer: Medicare Other | Attending: Neurosurgery | Admitting: Neurosurgery

## 2021-11-08 ENCOUNTER — Ambulatory Visit: Payer: Medicare Other

## 2021-11-08 ENCOUNTER — Other Ambulatory Visit: Payer: Self-pay

## 2021-11-08 ENCOUNTER — Encounter
Admission: RE | Disposition: A | Discharge status: Home / Self Care | Payer: Self-pay | Source: Home / Self Care | Attending: Neurosurgery

## 2021-11-08 ENCOUNTER — Ambulatory Visit: Payer: Medicare Other | Admitting: Urgent Care

## 2021-11-08 ENCOUNTER — Encounter: Payer: Self-pay | Admitting: Neurosurgery

## 2021-11-08 DIAGNOSIS — N1831 Chronic kidney disease, stage 3a: Secondary | ICD-10-CM

## 2021-11-08 DIAGNOSIS — Z853 Personal history of malignant neoplasm of breast: Secondary | ICD-10-CM | POA: Insufficient documentation

## 2021-11-08 DIAGNOSIS — Z87891 Personal history of nicotine dependence: Secondary | ICD-10-CM | POA: Diagnosis not present

## 2021-11-08 DIAGNOSIS — Z9889 Other specified postprocedural states: Secondary | ICD-10-CM | POA: Diagnosis not present

## 2021-11-08 DIAGNOSIS — I130 Hypertensive heart and chronic kidney disease with heart failure and stage 1 through stage 4 chronic kidney disease, or unspecified chronic kidney disease: Secondary | ICD-10-CM | POA: Insufficient documentation

## 2021-11-08 DIAGNOSIS — R29898 Other symptoms and signs involving the musculoskeletal system: Secondary | ICD-10-CM | POA: Diagnosis not present

## 2021-11-08 DIAGNOSIS — M5416 Radiculopathy, lumbar region: Secondary | ICD-10-CM | POA: Insufficient documentation

## 2021-11-08 DIAGNOSIS — N183 Chronic kidney disease, stage 3 unspecified: Secondary | ICD-10-CM | POA: Diagnosis not present

## 2021-11-08 DIAGNOSIS — Z9011 Acquired absence of right breast and nipple: Secondary | ICD-10-CM | POA: Diagnosis not present

## 2021-11-08 DIAGNOSIS — Z01818 Encounter for other preprocedural examination: Secondary | ICD-10-CM

## 2021-11-08 DIAGNOSIS — E039 Hypothyroidism, unspecified: Secondary | ICD-10-CM | POA: Diagnosis not present

## 2021-11-08 DIAGNOSIS — I2510 Atherosclerotic heart disease of native coronary artery without angina pectoris: Secondary | ICD-10-CM | POA: Diagnosis not present

## 2021-11-08 DIAGNOSIS — E1122 Type 2 diabetes mellitus with diabetic chronic kidney disease: Secondary | ICD-10-CM | POA: Insufficient documentation

## 2021-11-08 DIAGNOSIS — Z981 Arthrodesis status: Secondary | ICD-10-CM | POA: Diagnosis not present

## 2021-11-08 DIAGNOSIS — I509 Heart failure, unspecified: Secondary | ICD-10-CM | POA: Insufficient documentation

## 2021-11-08 HISTORY — PX: LUMBAR LAMINECTOMY/DECOMPRESSION MICRODISCECTOMY: SHX5026

## 2021-11-08 LAB — URINALYSIS, ROUTINE W REFLEX MICROSCOPIC
Bilirubin Urine: NEGATIVE
Glucose, UA: NEGATIVE mg/dL
Hgb urine dipstick: NEGATIVE
Ketones, ur: NEGATIVE mg/dL
Nitrite: NEGATIVE
Protein, ur: 30 mg/dL — AB
Specific Gravity, Urine: 1.023 (ref 1.005–1.030)
pH: 5 (ref 5.0–8.0)

## 2021-11-08 LAB — TYPE AND SCREEN
ABO/RH(D): O NEG
Antibody Screen: NEGATIVE

## 2021-11-08 LAB — GLUCOSE, CAPILLARY
Glucose-Capillary: 136 mg/dL — ABNORMAL HIGH (ref 70–99)
Glucose-Capillary: 164 mg/dL — ABNORMAL HIGH (ref 70–99)

## 2021-11-08 SURGERY — LUMBAR LAMINECTOMY/DECOMPRESSION MICRODISCECTOMY 1 LEVEL
Anesthesia: General | Site: Back | Laterality: Right

## 2021-11-08 MED ORDER — SUCCINYLCHOLINE CHLORIDE 200 MG/10ML IV SOSY
PREFILLED_SYRINGE | INTRAVENOUS | Status: DC | PRN
  Administered 2021-11-08: 120 mg via INTRAVENOUS

## 2021-11-08 MED ORDER — KETAMINE HCL 10 MG/ML IJ SOLN
INTRAMUSCULAR | Status: DC | PRN
  Administered 2021-11-08: 30 mg via INTRAVENOUS

## 2021-11-08 MED ORDER — SENNA 8.6 MG PO TABS: 1.0000 | ORAL_TABLET | Freq: Every day | ORAL | 0 refills | Status: DC | PRN

## 2021-11-08 MED ORDER — CHLORHEXIDINE GLUCONATE 0.12 % MT SOLN
OROMUCOSAL | Status: AC
  Filled 2021-11-08: qty 15

## 2021-11-08 MED ORDER — PHENYLEPHRINE HCL-NACL 20-0.9 MG/250ML-% IV SOLN
INTRAVENOUS | Status: AC
  Filled 2021-11-08: qty 250

## 2021-11-08 MED ORDER — ONDANSETRON HCL 4 MG/2ML IJ SOLN: 4.0000 mg | Freq: Once | INTRAMUSCULAR | Status: DC | PRN

## 2021-11-08 MED ORDER — OXYCODONE-ACETAMINOPHEN 7.5-325 MG PO TABS: 1.0000 | ORAL_TABLET | ORAL | 0 refills | Status: DC | PRN

## 2021-11-08 MED ORDER — MIDAZOLAM HCL 2 MG/2ML IJ SOLN
INTRAMUSCULAR | Status: DC | PRN
  Administered 2021-11-08: 2 mg via INTRAVENOUS

## 2021-11-08 MED ORDER — PROPOFOL 10 MG/ML IV BOLUS
INTRAVENOUS | Status: DC | PRN
  Administered 2021-11-08: 150 mg via INTRAVENOUS

## 2021-11-08 MED ORDER — 0.9 % SODIUM CHLORIDE (POUR BTL) OPTIME
TOPICAL | Status: DC | PRN
  Administered 2021-11-08: 500 mL

## 2021-11-08 MED ORDER — HYDROMORPHONE HCL 1 MG/ML IJ SOLN
INTRAMUSCULAR | Status: DC | PRN
  Administered 2021-11-08: 1 mg via INTRAVENOUS

## 2021-11-08 MED ORDER — BUPIVACAINE-EPINEPHRINE (PF) 0.5% -1:200000 IJ SOLN
INTRAMUSCULAR | Status: DC | PRN
Start: 2021-11-08 — End: 2021-11-08
  Administered 2021-11-08: 3 mL

## 2021-11-08 MED ORDER — EPHEDRINE SULFATE (PRESSORS) 50 MG/ML IJ SOLN
INTRAMUSCULAR | Status: DC | PRN
  Administered 2021-11-08: 15 mg via INTRAVENOUS
  Administered 2021-11-08: 10 mg via INTRAVENOUS

## 2021-11-08 MED ORDER — KETAMINE HCL 50 MG/5ML IJ SOSY
PREFILLED_SYRINGE | INTRAMUSCULAR | Status: AC
  Filled 2021-11-08: qty 5

## 2021-11-08 MED ORDER — HYDROMORPHONE HCL 1 MG/ML IJ SOLN
INTRAMUSCULAR | Status: AC
  Filled 2021-11-08: qty 1

## 2021-11-08 MED ORDER — BUPIVACAINE-EPINEPHRINE (PF) 0.5% -1:200000 IJ SOLN
INTRAMUSCULAR | Status: AC
  Filled 2021-11-08: qty 30

## 2021-11-08 MED ORDER — GLYCOPYRROLATE 0.2 MG/ML IJ SOLN
INTRAMUSCULAR | Status: DC | PRN
  Administered 2021-11-08: .2 mg via INTRAVENOUS

## 2021-11-08 MED ORDER — CEFAZOLIN SODIUM-DEXTROSE 2-4 GM/100ML-% IV SOLN
2.0000 g | INTRAVENOUS | Status: AC
  Administered 2021-11-08: 2 g via INTRAVENOUS

## 2021-11-08 MED ORDER — FENTANYL CITRATE (PF) 100 MCG/2ML IJ SOLN
INTRAMUSCULAR | Status: AC
  Filled 2021-11-08: qty 2

## 2021-11-08 MED ORDER — PHENYLEPHRINE 80 MCG/ML (10ML) SYRINGE FOR IV PUSH (FOR BLOOD PRESSURE SUPPORT)
PREFILLED_SYRINGE | INTRAVENOUS | Status: DC | PRN
  Administered 2021-11-08 (×2): 80 ug via INTRAVENOUS
  Administered 2021-11-08: 160 ug via INTRAVENOUS

## 2021-11-08 MED ORDER — CEFAZOLIN SODIUM-DEXTROSE 2-4 GM/100ML-% IV SOLN
INTRAVENOUS | Status: AC
  Filled 2021-11-08: qty 100

## 2021-11-08 MED ORDER — FENTANYL CITRATE PF 50 MCG/ML IJ SOSY
25.0000 ug | PREFILLED_SYRINGE | INTRAMUSCULAR | Status: DC | PRN
  Administered 2021-11-08: 25 ug via INTRAVENOUS

## 2021-11-08 MED ORDER — DEXAMETHASONE SODIUM PHOSPHATE 10 MG/ML IJ SOLN
INTRAMUSCULAR | Status: DC | PRN
Start: 2021-11-08 — End: 2021-11-08
  Administered 2021-11-08: 10 mg via INTRAVENOUS

## 2021-11-08 MED ORDER — FENTANYL CITRATE PF 50 MCG/ML IJ SOSY
PREFILLED_SYRINGE | INTRAMUSCULAR | Status: AC
  Administered 2021-11-08: 25 ug via INTRAVENOUS
  Filled 2021-11-08: qty 1

## 2021-11-08 MED ORDER — SODIUM CHLORIDE (PF) 0.9 % IJ SOLN
INTRAMUSCULAR | Status: DC | PRN
  Administered 2021-11-08: 60 mL

## 2021-11-08 MED ORDER — LIDOCAINE HCL (CARDIAC) PF 100 MG/5ML IV SOSY
PREFILLED_SYRINGE | INTRAVENOUS | Status: DC | PRN
  Administered 2021-11-08: 100 mg via INTRAVENOUS

## 2021-11-08 MED ORDER — ONDANSETRON HCL 4 MG/2ML IJ SOLN
INTRAMUSCULAR | Status: DC | PRN
  Administered 2021-11-08: 4 mg via INTRAVENOUS

## 2021-11-08 MED ORDER — OXYCODONE-ACETAMINOPHEN 5-325 MG PO TABS
ORAL_TABLET | ORAL | Status: DC
Start: 2021-11-08 — End: 2021-11-08
  Filled 2021-11-08: qty 1

## 2021-11-08 MED ORDER — FAMOTIDINE 20 MG PO TABS
ORAL_TABLET | ORAL | Status: AC
  Filled 2021-11-08: qty 1

## 2021-11-08 MED ORDER — PROPOFOL 10 MG/ML IV BOLUS
INTRAVENOUS | Status: AC
  Filled 2021-11-08: qty 20

## 2021-11-08 MED ORDER — OXYCODONE-ACETAMINOPHEN 5-325 MG PO TABS
1.0000 | ORAL_TABLET | ORAL | Status: DC | PRN
Start: 2021-11-08 — End: 2021-11-08
  Administered 2021-11-08: 1 via ORAL

## 2021-11-08 MED ORDER — METHYLPREDNISOLONE ACETATE 40 MG/ML IJ SUSP
INTRAMUSCULAR | Status: AC
  Filled 2021-11-08: qty 1

## 2021-11-08 MED ORDER — GLYCOPYRROLATE 0.2 MG/ML IJ SOLN
INTRAMUSCULAR | Status: AC
  Filled 2021-11-08: qty 1

## 2021-11-08 MED ORDER — MIDAZOLAM HCL 2 MG/2ML IJ SOLN
INTRAMUSCULAR | Status: AC
  Filled 2021-11-08: qty 2

## 2021-11-08 MED ORDER — SURGIFLO WITH THROMBIN (HEMOSTATIC MATRIX KIT) OPTIME
TOPICAL | Status: DC | PRN
  Administered 2021-11-08: 1 via TOPICAL

## 2021-11-08 MED ORDER — SODIUM CHLORIDE FLUSH 0.9 % IV SOLN
INTRAVENOUS | Status: AC
  Filled 2021-11-08: qty 20

## 2021-11-08 MED ORDER — BUPIVACAINE HCL (PF) 0.5 % IJ SOLN
INTRAMUSCULAR | Status: AC
  Filled 2021-11-08: qty 30

## 2021-11-08 MED ORDER — PHENYLEPHRINE HCL-NACL 20-0.9 MG/250ML-% IV SOLN
INTRAVENOUS | Status: DC | PRN
  Administered 2021-11-08: 40 ug/min via INTRAVENOUS

## 2021-11-08 MED ORDER — FENTANYL CITRATE (PF) 100 MCG/2ML IJ SOLN
25.0000 ug | INTRAMUSCULAR | Status: DC | PRN
  Administered 2021-11-08: 25 ug via INTRAVENOUS

## 2021-11-08 MED ORDER — BUPIVACAINE LIPOSOME 1.3 % IJ SUSP
INTRAMUSCULAR | Status: AC
  Filled 2021-11-08: qty 20

## 2021-11-08 MED ORDER — PHENYLEPHRINE 80 MCG/ML (10ML) SYRINGE FOR IV PUSH (FOR BLOOD PRESSURE SUPPORT)
PREFILLED_SYRINGE | INTRAVENOUS | Status: AC
  Filled 2021-11-08: qty 10

## 2021-11-08 SURGICAL SUPPLY — 54 items
BUR NEURO DRILL SOFT 3.0X3.8M (BURR) ×2 IMPLANT
CHLORAPREP W/TINT 26 (MISCELLANEOUS) ×4 IMPLANT
CNTNR SPEC 2.5X3XGRAD LEK (MISCELLANEOUS) ×1
CONT SPEC 4OZ STER OR WHT (MISCELLANEOUS) ×1
CONTAINER SPEC 2.5X3XGRAD LEK (MISCELLANEOUS) ×1 IMPLANT
COUNTER NEEDLE 20/40 LG (NEEDLE) ×2 IMPLANT
CUP MEDICINE 2OZ PLAST GRAD ST (MISCELLANEOUS) ×4 IMPLANT
DERMABOND ADVANCED (GAUZE/BANDAGES/DRESSINGS) ×1
DERMABOND ADVANCED .7 DNX12 (GAUZE/BANDAGES/DRESSINGS) ×1 IMPLANT
DRAPE C ARM PK CFD 31 SPINE (DRAPES) ×2 IMPLANT
DRAPE LAPAROTOMY 100X77 ABD (DRAPES) ×2 IMPLANT
DRAPE MICROSCOPE SPINE 48X150 (DRAPES) ×2 IMPLANT
DRAPE SURG 17X11 SM STRL (DRAPES) ×2 IMPLANT
ELECT CAUTERY BLADE TIP 2.5 (TIP) ×2
ELECT EZSTD 165MM 6.5IN (MISCELLANEOUS)
ELECT REM PT RETURN 9FT ADLT (ELECTROSURGICAL) ×2
ELECTRODE CAUTERY BLDE TIP 2.5 (TIP) ×1 IMPLANT
ELECTRODE EZSTD 165MM 6.5IN (MISCELLANEOUS) IMPLANT
ELECTRODE REM PT RTRN 9FT ADLT (ELECTROSURGICAL) ×1 IMPLANT
GLOVE BIOGEL PI IND STRL 6.5 (GLOVE) ×1 IMPLANT
GLOVE BIOGEL PI IND STRL 8.5 (GLOVE) ×1 IMPLANT
GLOVE BIOGEL PI INDICATOR 6.5 (GLOVE) ×1
GLOVE BIOGEL PI INDICATOR 8.5 (GLOVE) ×1
GLOVE SURG SYN 6.5 ES PF (GLOVE) ×4 IMPLANT
GLOVE SURG SYN 6.5 PF PI (GLOVE) ×2 IMPLANT
GLOVE SURG SYN 8.5  E (GLOVE) ×3
GLOVE SURG SYN 8.5 E (GLOVE) ×3 IMPLANT
GLOVE SURG SYN 8.5 PF PI (GLOVE) ×3 IMPLANT
GOWN SRG LRG LVL 4 IMPRV REINF (GOWNS) ×1 IMPLANT
GOWN SRG XL LVL 3 NONREINFORCE (GOWNS) ×1 IMPLANT
GOWN STRL NON-REIN TWL XL LVL3 (GOWNS) ×1
GOWN STRL REIN LRG LVL4 (GOWNS) ×1
GRADUATE 1200CC STRL 31836 (MISCELLANEOUS) ×2 IMPLANT
KIT SPINAL PRONEVIEW (KITS) ×2 IMPLANT
MANIFOLD NEPTUNE II (INSTRUMENTS) ×2 IMPLANT
MARKER SKIN DUAL TIP RULER LAB (MISCELLANEOUS) ×4 IMPLANT
NDL SAFETY ECLIPSE 18X1.5 (NEEDLE) ×1 IMPLANT
NEEDLE HYPO 18GX1.5 SHARP (NEEDLE) ×1
NEEDLE HYPO 22GX1.5 SAFETY (NEEDLE) ×2 IMPLANT
NS IRRIG 500ML POUR BTL (IV SOLUTION) ×1 IMPLANT
PACK LAMINECTOMY NEURO (CUSTOM PROCEDURE TRAY) ×2 IMPLANT
PAD ARMBOARD 7.5X6 YLW CONV (MISCELLANEOUS) ×2 IMPLANT
SOLUTION IRRIG SURGIPHOR (IV SOLUTION) ×1 IMPLANT
SURGIFLO W/THROMBIN 8M KIT (HEMOSTASIS) ×2 IMPLANT
SUT DVC VLOC 3-0 CL 6 P-12 (SUTURE) ×2 IMPLANT
SUT VIC AB 0 CT1 27 (SUTURE) ×1
SUT VIC AB 0 CT1 27XCR 8 STRN (SUTURE) ×1 IMPLANT
SUT VIC AB 2-0 CT1 18 (SUTURE) ×2 IMPLANT
SYR 10ML LL (SYRINGE) ×2 IMPLANT
SYR 20ML LL LF (SYRINGE) ×2 IMPLANT
SYR 30ML LL (SYRINGE) ×4 IMPLANT
SYR 3ML LL SCALE MARK (SYRINGE) ×2 IMPLANT
TOWEL OR 17X26 4PK STRL BLUE (TOWEL DISPOSABLE) ×6 IMPLANT
TUBING CONNECTING 10 (TUBING) ×2 IMPLANT

## 2021-11-08 NOTE — Op Note (Signed)
Indications: Ms. Blowers presented with: m54.16 lumbar radiculopathy, r29.898 right leg weakness  She failed conservative management and had weakness on presentation prompting surgery.  Findings: large disc herniation  Preoperative Diagnosis: m54.16 lumbar radiculopathy, r29.898 right leg weakness Postoperative Diagnosis: same   EBL: 10 ml IVF: see AR ml Drains: none Disposition: Extubated and Stable to PACU Complications: none  No foley catheter was placed.   Preoperative Note:   Risks of surgery discussed include: infection, bleeding, stroke, coma, death, paralysis, CSF leak, nerve/spinal cord injury, numbness, tingling, weakness, complex regional pain syndrome, recurrent stenosis and/or disc herniation, vascular injury, development of instability, neck/back pain, need for further surgery, persistent symptoms, development of deformity, and the risks of anesthesia. The patient understood these risks and agreed to proceed.  Operative Note:   1) Right L5/S1 microdiscectomy  The patient was then brought from the preoperative center with intravenous access established.  The patient underwent general anesthesia and endotracheal tube intubation, and was then rotated on the [place] rail top where all pressure points were appropriately padded.  The skin was then thoroughly cleansed.  Perioperative antibiotic prophylaxis was administered.  Sterile prep and drapes were then applied and a timeout was then observed.  C-arm was brought into the field under sterile conditions, and the L5-S1 disc space identified and marked with an incision on the right 1cm lateral to midline.  Once this was complete a 2 cm incision was opened with the use of a #10 blade knife.  The Metrx tubes were sequentially advanced under lateral fluoroscopy until a 18 x 80 mm Metrx tube was placed over the facet and lamina and secured to the bed.    The microscope was then sterilely brought into the field and muscle  creep was hemostased with a bipolar and resected with a pituitary rongeur.  A Bovie extender was then used to expose the spinous process and lamina.  Careful attention was placed to not violate the facet capsule. A 3 mm matchstick drill bit was then used to make a hemi-laminotomy trough until the ligamentum flavum was exposed.  This was extended to the base of the spinous process.  Once this was complete and the underlying ligamentum flavum was visualized this was dissected with an up angle curette and resected with a #2 and #3 mm biting Kerrison.  The laminotomy opening was also expanded in similar fashion and hemostasis was obtained with Surgifoam and a patty as well as bone wax.  The rostral aspect of the caudal level of the lamina was also resected with a #2 biting Kerrison effort to further enhance exposure.  Once the underlying dura was visualized a Penfield 4 was then used to dissect and expose the traversing nerve root.  Once this was identified a nerve root retractor suction was used to mobilize this medially.  The venous plexus was hemostased with Surgifoam and light bipolar use.  A small penfied was then used to make a small annulotomy within the disc space and disc space contents were noted to come through the annulus.    The disc herniation was identified and dissected free using a balltip probe. The pituitary rongeur was used to remove the extruded disc fragments. Once the thecal sac and nerve root were noted to be relaxed and under less tension the ball-tipped feeler was passed along the foramen distally to to ensure no residual compression was noted.    The area was irrigated. The tube system was then removed under microscopic visualization and hemostasis was obtained  with a bipolar.    The fascial layer was reapproximated with the use of a 0- Vicryl suture.  Subcutaneous tissue layer was reapproximated using 2-0 Vicryl suture.  3-0 monocryl was used on the skin. The skin was then cleansed and  Dermabond was used to close the skin opening.  Patient was then rotated back to the preoperative bed awakened from anesthesia and taken to recovery all counts are correct in this case.   I performed the entire procedure with the assistance of Cooper Render PA as an Pensions consultant.  Meade Maw MD

## 2021-11-08 NOTE — Discharge Summary (Incomplete)
Discharge Summary  Patient: Kendra Walton HWE:[id number] DOB: August 07, 1950 PCP: Glean Hess, MD  Date of admission: 11/08/2021  Date of discharge:   Discharge Condition:   Discharge Diagnoses: Active Problems:   * No active hospital problems. *  Resolved Problems:   * No resolved hospital problems. [facility] Course: No notes on file Assessment and Plan: No notes have been filed under this hospital service. Service: Hospitalist     {Tip this will not be part of the note when signed There is no height or weight on file to calculate BMI. , ,  (Optional):26781}   Consultants: *** Procedures: ***  Exam: There were no vitals filed for this visit. ***    Imaging Studies: DG Lumbar Spine 2-3 Views  Result Date: 11/08/2021 CLINICAL DATA:  L5-S1 micro discectomy. EXAM: LUMBAR SPINE - 2-3 VIEW; DG C-ARM 1-60 MIN-NO REPORT Radiation exposure index: 12.988 mGy. COMPARISON:  Oct 25, 2021. FINDINGS: Two intraoperative fluoroscopic images demonstrate surgical probe directed toward posterior portion of L5-S1. IMPRESSION: Fluoroscopic guidance provided during lower lumbar surgery. Electronically Signed   By: Marijo Conception M.D.   On: 11/08/2021 15:24   MR LUMBAR SPINE WO CONTRAST  Result Date: 10/25/2021 CLINICAL DATA:  Central and right low back pain, right hip buttock and leg pain EXAM: MRI LUMBAR SPINE WITHOUT CONTRAST TECHNIQUE: Multiplanar, multisequence MR imaging of the lumbar spine was performed. No intravenous contrast was administered. COMPARISON:  No prior lumbar spine imaging correlation is made with 02/06/2019 CT abdomen FINDINGS: Segmentation:  Standard. Alignment: Mild dextrocurvature. Trace retrolisthesis of L1 on L2. Trace anterolisthesis of L4 on L5. Vertebrae:  No acute fracture or suspicious osseous lesion. Conus medullaris and cauda equina: Conus extends to the L2 level. Conus and cauda equina appear normal. Paraspinal and other soft tissues: Right  adrenal lesion, which measures up to 7.1 x 7.1 cm in the axial plane (series 8, image 2), previously up to 4.8 x 3.8 cm on 02/06/2019. atrophy of the inferior paraspinous musculature. Disc levels: T12-L1: No significant disc bulge. No spinal canal stenosis or neural foraminal narrowing. L1-L2: Trace retrolisthesis and minimal disc bulge. No spinal canal stenosis or neural foraminal narrowing. L2-L3: Minimal disc bulge. Borderline spinal canal stenosis. No neural foraminal narrowing. L3-L4: Mild disc bulge. No spinal canal stenosis or neural foraminal narrowing. L4-L5: Trace anterolisthesis with disc unroofing. Severe left and moderate to severe right facet arthropathy. Narrowing of the lateral recesses. Mild spinal canal stenosis. No neural foraminal narrowing. L5-S1: Mild disc bulge with superimposed right subarticular disc extrusion with 14 mm of caudal migration, which effaces the right lateral recess and contacts and compresses the descending right S1 nerve roots. Moderate facet arthropathy. No spinal canal stenosis. Mild right neural foraminal narrowing. IMPRESSION: 1. L5-S1 right subarticular disc extrusion with caudal migration, which contacts and compresses the descending right S1 nerve roots. At this level there is also mild right neural foraminal narrowing. 2. L4-L5 mild spinal canal stenosis. Severe facet arthropathy at this level canal so be a cause of back pain 3. 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 A cystic and solid mass is favored given its interval growth, and a dedicated adrenal protocol MRI is recommended. These results will be called to the ordering clinician or representative by the Radiologist Assistant, and communication documented in the PACS or [employer]. Electronically Signed   By: Merilyn Baba M.D.   On: 10/25/2021 19:18   DG C-Arm  1-60 Min-No Report  Result Date: 11/08/2021 Fluoroscopy was utilized by the requesting physician.  No  radiographic interpretation.    Microbiology: Results for orders placed or performed during the hospital encounter of 09/19/21  Surgical PCR Screen     Status: None   Collection Time: 09/19/21 12:35 PM   Specimen: Nasal Mucosa; Nasal Swab  Result Value Ref Range Status   MRSA, PCR NEGATIVE NEGATIVE Final   Staphylococcus aureus NEGATIVE NEGATIVE Final    Comment: (NOTE) The Xpert SA Assay (FDA approved for NASAL specimens in patients 85 years of age and older), is one component of a comprehensive surveillance program. It is not intended to diagnose infection nor to guide or monitor treatment. Performed at [facility], [address] [place], [place] [address]     Labs:  CBC: No results for input(s): WBC, NEUTROABS, HGB, HCT, MCV, PLT in the last 168 hours. Basic Metabolic Panel: No results for input(s): NA, K, CL, CO2, GLUCOSE, BUN, CREATININE, CALCIUM, MG, PHOS in the last 168 hours. Liver Function Tests: No results for input(s): AST, ALT, ALKPHOS, BILITOT, PROT, ALBUMIN in the last 168 hours. CBG: Recent Labs  Lab 11/08/21 1027 11/08/21 1559  GLUCAP 136* 164*     Author: Loleta Dicker, PA

## 2021-11-08 NOTE — Discharge Instructions (Addendum)
Your surgeon has performed an operation on your lumbar spine (low back) to relieve pressure on one or more nerves. Many times, patients feel better immediately after surgery and can "overdo it." Even if you feel well, it is important that you follow these activity guidelines. If you do not let your back heal properly from the surgery, you can increase the chance of a disc herniation and/or return of your symptoms. The following are instructions to help in your recovery once you have been discharged from the hospital.  * Do not take anti-inflammatory medications for 3 days after surgery (naproxen [Aleve], ibuprofen [Advil, Motrin], celecoxib [Celebrex], etc.)  Activity    No bending, lifting, or twisting ("BLT"). Avoid lifting objects heavier than 10 pounds (gallon milk jug).  Where possible, avoid household activities that involve lifting, bending, pushing, or pulling such as laundry, vacuuming, grocery shopping, and childcare. Try to arrange for help from friends and family for these activities while your back heals.  Increase physical activity slowly as tolerated.  Taking short walks is encouraged, but avoid strenuous exercise. Do not jog, run, bicycle, lift weights, or participate in any other exercises unless specifically allowed by your doctor. Avoid prolonged sitting, including car rides.  Talk to your doctor before resuming sexual activity.  You should not drive until cleared by your doctor.  Until released by your doctor, you should not return to work or school.  You should rest at home and let your body heal.   You may shower two days after your surgery.  After showering, lightly dab your incision dry. Do not take a tub bath or go swimming for 3 weeks, or until approved by your doctor at your follow-up appointment.  If you smoke, we strongly recommend that you quit.  Smoking has been proven to interfere with normal healing in your back and will dramatically reduce the success rate of  your surgery. Please contact [facility] (800-QUIT-NOW) and use the resources at www.[facility].com for assistance in stopping smoking.  Surgical Incision   If you have a dressing on your incision, you may remove it three days after your surgery. Keep your incision area clean and dry.  If you have staples or stitches on your incision, you should have a follow up scheduled for removal. If you do not have staples or stitches, you will have steri-strips (small pieces of surgical tape) or Dermabond glue. The steri-strips/glue should begin to peel away within about a week (it is fine if the steri-strips fall off before then). If the strips are still in place one week after your surgery, you may gently remove them.  Diet            You may return to your usual diet. Be sure to stay hydrated.  When to Contact us  Although your surgery and recovery will likely be uneventful, you may have some residual numbness, aches, and pains in your back and/or legs. This is normal and should improve in the next few weeks.  However, should you experience any of the following, contact us immediately: New numbness or weakness Pain that is progressively getting worse, and is not relieved by your pain medications or rest Bleeding, redness, swelling, pain, or drainage from surgical incision Chills or flu-like symptoms Fever greater than 101.0 F (38.3 C) Problems with bowel or bladder functions Difficulty breathing or shortness of breath Warmth, tenderness, or swelling in your calf  Contact Information During office hours (Monday-Friday 9 am to 5 pm), please call your physician  at [phone] After hours and weekends, please call [phone] and speak with the answering service, who will contact the doctor on call.  If that fails, call the Deputy Operator at [phone] and ask for the Neurosurgery Resident On Call  For a life-threatening emergency, call [place]   The  drugs that you were given will stay in your system until tomorrow so for the next 24 hours you should not:  Drive an automobile Make any legal decisions Drink any alcoholic beverage   You may resume regular meals tomorrow.  Today it is better to start with liquids and gradually work up to solid foods.  You may eat anything you prefer, but it is better to start with liquids, then soup and crackers, and gradually work up to solid foods.   Please notify your doctor immediately if you have any unusual bleeding, trouble breathing, redness and pain at the surgery site, drainage, fever, or pain not relieved by medication.    Additional Instructions:        Please contact your physician with any problems or Same Day Surgery at [phone], Monday through Friday 6 am to 4 pm, or [place] at [facility] number at [phone].

## 2021-11-08 NOTE — Progress Notes (Signed)
Pharmacy Antibiotic Note  Kendra Walton is a 71 y.o. female admitted on (Not on file) with surgical prophylaxis.  Pharmacy has been consulted for Cefazolin dosing.  Plan: TBW = 100.2 kg   Cefazolin 2 gm IV X 1 60 min pre-op ordered for 5/24 @ 0500.      No data recorded.  No results for input(s): WBC, CREATININE, LATICACIDVEN, VANCOTROUGH, VANCOPEAK, VANCORANDOM, GENTTROUGH, GENTPEAK, GENTRANDOM, TOBRATROUGH, TOBRAPEAK, TOBRARND, AMIKACINPEAK, AMIKACINTROU, AMIKACIN in the last 168 hours.  Estimated Creatinine Clearance: 52.1 mL/min (A) (by C-G formula based on SCr of 1.19 mg/dL (H)).    Allergies  Allergen Reactions   Shellfish Allergy Hives and Itching    Antimicrobials this admission:   >>    >>   Dose adjustments this admission:   Microbiology results:  BCx:   UCx:    Sputum:    MRSA PCR:   Thank you for allowing pharmacy to be a part of this patient's care.  Kendra Walton D 11/08/2021 12:07 AM

## 2021-11-08 NOTE — Transfer of Care (Signed)
Immediate Anesthesia Transfer of Care Note  Patient: Kendra Walton  Procedure(s) Performed: RIGHT L5-S1 MICRODISCECTOMY (Right: Back)  Patient Location: PACU  Anesthesia Type:General  Level of Consciousness: awake, alert  and oriented  Airway & Oxygen Therapy: Patient Spontanous Breathing and Patient connected to face mask oxygen  Post-op Assessment: Report given to RN, Post -op Vital signs reviewed and stable and Patient moving all extremities  Post vital signs: Reviewed and stable  Last Vitals:  Vitals Value Taken Time  BP 85/42 11/08/21 1602  Temp    Pulse 111 11/08/21 1602  Resp 14 11/08/21 1602  SpO2 95 % 11/08/21 1602    Last Pain:  Vitals:   11/08/21 1041  PainSc: 2          Complications: No notable events documented.

## 2021-11-08 NOTE — H&P (Signed)
I have reviewed and confirmed my history and physical from 10/30/21 with no additions or changes. Plan for R L5-S1 microdiscectomy.  Risks and benefits reviewed.  Heart sounds normal no MRG. Chest Clear to Auscultation Bilaterally.

## 2021-11-08 NOTE — Anesthesia Preprocedure Evaluation (Signed)
Anesthesia Evaluation  Patient identified by MRN, date of birth, ID band Patient awake    Reviewed: Allergy & Precautions, H&P , NPO status , Patient's Chart, lab work & pertinent test results, reviewed documented beta blocker date and time   History of Anesthesia Complications Negative for: history of anesthetic complications  Airway Mallampati: II  TM Distance: >3 FB Neck ROM: full    Dental  (+) Dental Advidsory Given, Edentulous Upper, Missing, Partial Lower   Pulmonary neg pulmonary ROS, former smoker,    Pulmonary exam normal breath sounds clear to auscultation       Cardiovascular Exercise Tolerance: Good hypertension, (-) angina+ CAD and +CHF (no episodes since TAVR)  (-) Past MI and (-) Cardiac Stents Normal cardiovascular exam(-) dysrhythmias + Valvular Problems/Murmurs (s/p TAVR in 2020) AS  Rhythm:regular Rate:Normal     Neuro/Psych negative neurological ROS  negative psych ROS   GI/Hepatic negative GI ROS, Neg liver ROS,   Endo/Other  diabetesHypothyroidism   Renal/GU CRFRenal disease  negative genitourinary   Musculoskeletal   Abdominal   Peds  Hematology negative hematology ROS (+)   Anesthesia Other Findings Past Medical History: 09/15/2015: Absolute anemia     Comment:  Patient declines colonoscopy  04/05/2017: Aortic stenosis, moderate     Comment:  a.) s/p TAVR 08/18/2018; LEFT axillary approach No date: Arthritis 2010: Breast cancer, right ([place])     Comment:  a.) s/p RIGHT mastectomy; no chemotherpay or XRT 08/06/2018: CAD (coronary artery disease)     Comment:  a.) R/LHC 08/06/2018: EF 35%; 20% stenosis oRI; severe               AS (MPG 48.2 mmHg; AVA (VTI) = 0.58 cm); mean PA = 36               mmHg, mean PCWP = 27 mmHg, PVR 1.78 WU, CO 5.06 L/min, CI              2.25 L/min/m No date: CHF (congestive heart failure) ([place])     Comment:  a.)  TTE 07/29/2018: EF 35%; moderate global HK;                moderate LVH; mild LA dilation; trivial TR/PR, mild               AR/MR; moderate AS (MPG 48.2 mmHg); G2DD. b.) R/LHC               08/06/2018; EF 35%; mean PCWP 27 mmHg. c.)  TTE               10/20/2019: EF >55%; mild LVH; trivial MR/TR, mild PR,               prostatic AoV. d.)  TTE 08/17/2021: EF >55%; mild LVH;               mild LA dilation; trivial MR/TR/PR; prostatic AoV (MPG 12              mmHg); G1DD. No date: CKD (chronic kidney disease), stage III ([place]) No date: Ectopic pregnancy, tubal 08/18/2018: History of transcatheter aortic valve replacement (TAVR)     Comment:  a.) 26 mm Medtronic Corevalve Evolute No date: HLD (hyperlipidemia) No date: Hypertension No date: Hypothyroidism 09/15/2015: Leukocytosis     Comment:  Hematology eval - likely benign; follow up planned  No date: Neuropathy 04/13/2016: Post-menopausal bleeding     Comment:  US showed thickened endometrium; biopsy recommended but  patient declined 02/06/2019: Right adrenal mass [facility])     Comment:  a.) CT CAP 08/14/2018: measured 4.3 x 3.3 cm. b.) CT abd              02/06/2019: measured 4.7 x 3.7 cm. c.) MRI lumbar spine               10/25/2021: interval increase to 7.1 x 7.1 cm. No date: T2DM (type 2 diabetes mellitus) ([place])  ECHO 3/23: NORMAL LEFT VENTRICULAR SYSTOLIC FUNCTION  WITH MILD LVH  NORMAL RIGHT VENTRICULAR SYSTOLIC FUNCTION  TRIVIAL REGURGITATION NOTED (See above)  NO VALVULAR STENOSIS  ESTIMATED LVEF >55%  Aortic: NORMAL GRADIENTS  AOV: BIOPROSTHETIC VALVE: MEDTRONIC COREVALVE EVOLUTE 31m  Mitral: TRACE MR  Tricuspid: TRIVIAL TR  Pulmonic: TRIVIAL PI  MILD LAE  Leaflets: BIOPROSTHETIC  Closest EF: >55% (Estimated)  LVH: MILD LVH  AVS: BIOPROSTHETIC AoV  Mitral: TRIVIAL MR  Tricuspid: TRIVIAL TR    Reproductive/Obstetrics negative OB ROS                             Anesthesia Physical Anesthesia Plan  ASA: 3  Anesthesia  Plan: General   Post-op Pain Management:    Induction: Intravenous  PONV Risk Score and Plan: 3 and Ondansetron, Dexamethasone, Propofol infusion and TIVA  Airway Management Planned: Oral ETT  Additional Equipment:   Intra-op Plan:   Post-operative Plan: Extubation in OR  Informed Consent: I have reviewed the patients History and Physical, chart, labs and discussed the procedure including the risks, benefits and alternatives for the proposed anesthesia with the patient or authorized representative who has indicated his/her understanding and acceptance.     Dental Advisory Given  Plan Discussed with: Anesthesiologist, CRNA and Surgeon  Anesthesia Plan Comments:         Anesthesia Quick Evaluation

## 2021-11-08 NOTE — Anesthesia Procedure Notes (Signed)
Procedure Name: Intubation Date/Time: 11/08/2021 2:38 PM Performed by: Esaw Grandchild, CRNA Pre-anesthesia Checklist: Patient identified, Emergency Drugs available, Suction available and Patient being monitored Patient Re-evaluated:Patient Re-evaluated prior to induction Oxygen Delivery Method: Circle system utilized Preoxygenation: Pre-oxygenation with 100% oxygen Induction Type: IV induction Ventilation: Mask ventilation without difficulty Laryngoscope Size: Miller and 2 Grade View: Grade I Tube type: Oral Tube size: 7.0 mm Number of attempts: 1 Airway Equipment and Method: Stylet, Oral airway and Bite block Placement Confirmation: ETT inserted through vocal cords under direct vision, positive ETCO2 and breath sounds checked- equal and bilateral Secured at: 21 cm Tube secured with: Tape Dental Injury: Teeth and Oropharynx as per pre-operative assessment

## 2021-11-09 ENCOUNTER — Encounter: Payer: Self-pay | Admitting: Neurosurgery

## 2021-11-12 NOTE — Anesthesia Postprocedure Evaluation (Signed)
Anesthesia Post Note  Patient: Kendra Walton  Procedure(s) Performed: RIGHT L5-S1 MICRODISCECTOMY (Right: Back)  Patient location during evaluation: PACU Anesthesia Type: General Level of consciousness: awake and alert Pain management: pain level controlled Vital Signs Assessment: post-procedure vital signs reviewed and stable Respiratory status: spontaneous breathing, nonlabored ventilation, respiratory function stable and patient connected to nasal cannula oxygen Cardiovascular status: blood pressure returned to baseline and stable Postop Assessment: no apparent nausea or vomiting Anesthetic complications: no   No notable events documented.   Last Vitals:  Vitals:   11/08/21 1643 11/08/21 1730  BP: (!) 148/81 120/72  Pulse: (!) 102 98  Resp: 17 15  Temp: (!) 36.1 C   SpO2: 97% 100%    Last Pain:  Vitals:   11/08/21 1643  TempSrc: Temporal  PainSc: 2                  Martha Clan

## 2021-11-19 ENCOUNTER — Other Ambulatory Visit: Payer: Self-pay | Admitting: Internal Medicine

## 2021-11-19 DIAGNOSIS — I10 Essential (primary) hypertension: Secondary | ICD-10-CM

## 2021-11-21 NOTE — Telephone Encounter (Signed)
Requested Prescriptions  Pending Prescriptions Disp Refills  . lisinopril-hydrochlorothiazide (ZESTORETIC) 10-12.5 MG tablet [Pharmacy Med Name: LISINOPRIL-HCTZ 10/12.5MG TABLETS] 90 tablet 1    Sig: TAKE 1 TABLET BY MOUTH DAILY     Cardiovascular:  ACEI + Diuretic Combos Failed - 11/19/2021  5:44 PM      Failed - K in normal range and within 180 days    Potassium  Date Value Ref Range Status  10/27/2021 3.4 (L) 3.5 - 5.1 mmol/L Final         Failed - Cr in normal range and within 180 days    Creatinine, Ser  Date Value Ref Range Status  10/27/2021 1.19 (H) 0.44 - 1.00 mg/dL Final         Passed - Na in normal range and within 180 days    Sodium  Date Value Ref Range Status  10/27/2021 139 135 - 145 mmol/L Final  06/20/2021 141 134 - 144 mmol/L Final         Passed - eGFR is 30 or above and within 180 days    GFR calc Af Amer  Date Value Ref Range Status  05/09/2020 65 >59 mL/min/1.73 Final    Comment:    **In accordance with recommendations from the NKF-ASN Task force,**   [facility] is in the process of updating its eGFR calculation to the   2021 CKD-EPI creatinine equation that estimates kidney function   without a race variable.    GFR, Estimated  Date Value Ref Range Status  10/27/2021 48 (L) >60 mL/min Final    Comment:    (NOTE) Calculated using the CKD-EPI Creatinine Equation (2021)    eGFR  Date Value Ref Range Status  06/20/2021 53 (L) >59 mL/min/1.73 Final         Passed - Patient is not pregnant      Passed - Last BP in normal range    BP Readings from Last 1 Encounters:  11/08/21 120/72         Passed - Valid encounter within last 6 months    Recent Outpatient Visits          5 months ago Type 2 diabetes mellitus with stage 3a chronic kidney disease, without long-term current use of insulin [facility])   [facility] Glean Hess, MD   9 months ago Annual physical exam   [facility] Glean Hess, MD   1 year ago Type II  diabetes mellitus with complication [facility])   [facility] Glean Hess, MD   1 year ago Type II diabetes mellitus with complication [facility])   [facility] Glean Hess, MD   1 year ago Annual physical exam   [facility] Glean Hess, MD      Future Appointments            In 3 months [facility] Jesse Sans, MD [facility]

## 2021-12-06 ENCOUNTER — Ambulatory Visit (INDEPENDENT_AMBULATORY_CARE_PROVIDER_SITE_OTHER): Payer: Medicare Other | Admitting: Internal Medicine

## 2021-12-06 ENCOUNTER — Encounter: Payer: Self-pay | Admitting: Internal Medicine

## 2021-12-06 VITALS — BP 94/68 | HR 93 | Ht 68.0 in | Wt 236.0 lb

## 2021-12-06 DIAGNOSIS — N1831 Chronic kidney disease, stage 3a: Secondary | ICD-10-CM

## 2021-12-06 DIAGNOSIS — M170 Bilateral primary osteoarthritis of knee: Secondary | ICD-10-CM | POA: Diagnosis not present

## 2021-12-06 DIAGNOSIS — I10 Essential (primary) hypertension: Secondary | ICD-10-CM | POA: Diagnosis not present

## 2021-12-06 DIAGNOSIS — E118 Type 2 diabetes mellitus with unspecified complications: Secondary | ICD-10-CM | POA: Diagnosis not present

## 2021-12-06 DIAGNOSIS — E1122 Type 2 diabetes mellitus with diabetic chronic kidney disease: Secondary | ICD-10-CM

## 2021-12-06 LAB — POCT GLYCOSYLATED HEMOGLOBIN (HGB A1C): Hemoglobin A1C: 6 % — AB (ref 4.0–5.6)

## 2021-12-06 MED ORDER — METFORMIN HCL ER 500 MG PO TB24: ORAL_TABLET | ORAL | 1 refills | Status: DC

## 2021-12-06 MED ORDER — TRAMADOL HCL 50 MG PO TABS: 100.0000 mg | ORAL_TABLET | Freq: Three times a day (TID) | ORAL | 1 refills | Status: DC

## 2021-12-06 NOTE — Progress Notes (Signed)
Date:  12/06/2021   Name:  Kendra Walton   DOB:  08/01/1950   MRN:  [id number]   Chief Complaint: Diabetes and Hypertension  Diabetes She presents for her follow-up diabetic visit. She has type 2 diabetes mellitus. Her disease course has been stable. Pertinent negatives for hypoglycemia include no headaches, nervousness/anxiousness or tremors. Pertinent negatives for diabetes include no chest pain, no fatigue, no polydipsia and no polyuria. Current diabetic treatment includes oral agent (monotherapy) (metformin). She is compliant with treatment all of the time.  Hypertension This is a chronic problem. The problem is controlled. Pertinent negatives include no chest pain, headaches, palpitations or shortness of breath. Past treatments include ACE inhibitors and diuretics.  Back surgery - recovering well from micro-discectomy one month ago.  Was on percocet but finished those this past weekend.  Now needs a refill on Tramadol. Anticipating knee surgery in the near future.  Lab Results  Component Value Date   NA 139 10/27/2021   K 3.4 (L) 10/27/2021   CO2 26 10/27/2021   GLUCOSE 120 (H) 10/27/2021   BUN 32 (H) 10/27/2021   CREATININE 1.19 (H) 10/27/2021   CALCIUM 9.4 10/27/2021   EGFR 53 (L) 06/20/2021   GFRNONAA 48 (L) 10/27/2021   Lab Results  Component Value Date   CHOL 146 02/15/2021   HDL 39 (L) 02/15/2021   LDLCALC 72 02/15/2021   TRIG 212 (H) 02/15/2021   CHOLHDL 3.7 02/15/2021   Lab Results  Component Value Date   TSH 7.050 (H) 01/04/2020   Lab Results  Component Value Date   HGBA1C 6.0 (A) 12/06/2021   Lab Results  Component Value Date   WBC 11.2 (H) 10/27/2021   HGB 11.5 (L) 10/27/2021   HCT 37.3 10/27/2021   MCV 84.0 10/27/2021   PLT 262 10/27/2021   Lab Results  Component Value Date   ALT 11 09/19/2021   AST 13 (L) 09/19/2021   ALKPHOS 70 09/19/2021   BILITOT 0.4 09/19/2021   No results found for: "25OHVITD2", "25OHVITD3", "VD25OH"    Review of Systems  Constitutional:  Negative for appetite change, fatigue, fever and unexpected weight change.  HENT:  Negative for tinnitus and trouble swallowing.   Eyes:  Negative for visual disturbance.  Respiratory:  Negative for cough, chest tightness and shortness of breath.   Cardiovascular:  Negative for chest pain, palpitations and leg swelling.  Gastrointestinal:  Negative for abdominal pain.  Endocrine: Negative for polydipsia and polyuria.  Genitourinary:  Negative for dysuria and hematuria.  Musculoskeletal:  Positive for arthralgias, back pain and myalgias.  Neurological:  Negative for tremors, numbness and headaches.  Psychiatric/Behavioral:  Negative for dysphoric mood and sleep disturbance. The patient is not nervous/anxious.     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   Obesity (BMI 30-39.9) 07/09/2018   Hearing decreased 12/13/2015   Hyperlipidemia associated with type 2 diabetes mellitus ([place]) 09/14/2015   History of breast cancer in female 04/07/2015   Acquired hypothyroidism 10/04/2014   Diabetic peripheral neuropathy ([place]) 10/04/2014   Essential (primary) hypertension 10/04/2014   Arthritis of knee, degenerative 10/04/2014   DM (diabetes mellitus), type 2 with neurological complications ([place]) 40/98/1191    Allergies  Allergen Reactions   Shellfish Allergy Hives and Itching  Past Surgical History:  Procedure Laterality Date   CESAREAN SECTION     CHOLECYSTECTOMY  2005   ECTOPIC PREGNANCY SURGERY     LUMBAR LAMINECTOMY/DECOMPRESSION MICRODISCECTOMY Right 11/08/2021   Procedure: RIGHT L5-S1 MICRODISCECTOMY;  Surgeon: Meade Maw, MD;  Location: [facility];  Service: Neurosurgery;  Laterality: Right;    MASTECTOMY, PARTIAL Right 2010   REDUCTION MAMMAPLASTY Left 2011   RIGHT/LEFT HEART CATH AND CORONARY ANGIOGRAPHY Bilateral 08/06/2018   Procedure: RIGHT/LEFT HEART CATH AND CORONARY ANGIOGRAPHY;  Surgeon: Teodoro Spray, MD;  Location: [facility];  Service: Cardiovascular;  Laterality: Bilateral;   TRANSCATHETER AORTIC VALVE REPLACEMENT, TRANSAPICAL  08/18/2018   Procedure: TRANSCATHETER AORTIC VALVE REPLACEMENT; Location: [facility]; Surgeon: Sharmon Leyden, MD    Social History   Tobacco Use   Smoking status: Former    Packs/day: 1.00    Years: 40.00    Total pack years: 40.00    Types: Cigarettes    Quit date: 06/18/2012    Years since quitting: 9.4   Smokeless tobacco: Never  Vaping Use   Vaping Use: Never used  Substance Use Topics   Alcohol use: No    Alcohol/week: 0.0 standard drinks of alcohol   Drug use: No     Medication list has been reviewed and updated.  Current Meds  Medication Sig   atorvastatin (LIPITOR) 20 MG tablet Take 1 tablet (20 mg total) by mouth at bedtime.   glucose blood (ONE TOUCH ULTRA TEST) test strip Use to test BS twice a day   Lancets (ONETOUCH DELICA PLUS [id number]) MISC USE 1 EACH TWICE DAILY   levothyroxine (SYNTHROID) 150 MCG tablet Take 75-150 mcg by mouth See admin instructions. Take 150 mcg daily except Sundays take 75 mcg   lisinopril-hydrochlorothiazide (ZESTORETIC) 10-12.5 MG tablet TAKE 1 TABLET BY MOUTH DAILY   methocarbamol (ROBAXIN) 750 MG tablet Take 750 mg by mouth 4 (four) times daily.   Polyvinyl Alcohol-Povidone (REFRESH OP) Place 1 drop into both eyes daily.   pregabalin (LYRICA) 50 MG capsule Take 1 capsule (50 mg total) by mouth 4 (four) times daily. (Patient taking differently: Take 100 mg by mouth 3 (three) times daily.)   senna (SENOKOT) 8.6 MG TABS tablet Take 1 tablet (8.6 mg total) by mouth daily as needed for mild constipation.   tizanidine (ZANAFLEX) 2 MG capsule Take 2-4 mg by mouth every 8 (eight) hours as  needed for muscle spasms.   [DISCONTINUED] metFORMIN (GLUCOPHAGE-XR) 500 MG 24 hr tablet TAKE 2 TABLETS(1000 MG) BY MOUTH TWICE DAILY       12/06/2021   10:12 AM 06/20/2021    9:41 AM 02/15/2021    9:19 AM 09/13/2020    8:57 AM  GAD 7 : Generalized Anxiety Score  Nervous, Anxious, on Edge 0 0 0 0  Control/stop worrying 0 0 0 0  Worry too much - different things 0 0 0 0  Trouble relaxing 0 0 0 0  Restless 0 0 0 0  Easily annoyed or irritable 0 0 0 0  Afraid - awful might happen 0 0 0 0  Total GAD 7 Score 0 0 0 0  Anxiety Difficulty Not difficult at all Not difficult at all  Not difficult at all       12/06/2021   10:12 AM  Depression screen PHQ 2/9  Decreased Interest 0  Down, Depressed, Hopeless 0  PHQ - 2 Score 0  Altered sleeping 0  Tired, decreased energy 0  Change in appetite 0  Feeling bad or failure about yourself  0  Trouble concentrating 0  Moving slowly or fidgety/restless 0  Suicidal thoughts 0  PHQ-9 Score 0  Difficult doing work/chores Not difficult at all    BP Readings from Last 3 Encounters:  12/06/21 94/68  11/08/21 120/72  10/27/21 (!) 144/66    Physical Exam Vitals and nursing note reviewed.  Constitutional:      General: She is not in acute distress.    Appearance: She is well-developed.  HENT:     Head: Normocephalic and atraumatic.  Cardiovascular:     Rate and Rhythm: Normal rate and regular rhythm.  Pulmonary:     Effort: Pulmonary effort is normal. No respiratory distress.     Breath sounds: No wheezing or rhonchi.  Musculoskeletal:     Cervical back: Normal range of motion and neck supple.     Right lower leg: No edema.     Left lower leg: No edema.  Skin:    General: Skin is warm and dry.     Findings: No rash.  Neurological:     Mental Status: She is alert and oriented to person, place, and time.  Psychiatric:        Mood and Affect: Mood normal.        Behavior: Behavior normal.     Wt Readings from Last 3 Encounters:   12/06/21 236 lb (107 kg)  11/03/21 221 lb (100.2 kg)  10/27/21 235 lb (106.6 kg)    BP 94/68   Pulse 93   Ht 5' 8"  (1.727 m)   Wt 236 lb (107 kg)   SpO2 95%   BMI 35.88 kg/m   Assessment and Plan: 1. Essential (primary) hypertension Clinically stable exam with well controlled BP. Tolerating medications without side effects at this time. Pt to continue current regimen and low sodium diet; benefits of regular exercise as able discussed.  2. Type 2 diabetes mellitus with stage 3a chronic kidney disease, without long-term current use of insulin ([place]) Clinically stable by exam and report without s/s of hypoglycemia. DM complicated by hypertension and dyslipidemia. Tolerating medications well without side effects or other concerns. Losing weight steadily. - POCT glycosylated hemoglobin (Hb A1C)= 6.0 - metFORMIN (GLUCOPHAGE-XR) 500 MG 24 hr tablet; TAKE 2 TABLETS(1000 MG) BY MOUTH TWICE DAILY  Dispense: 360 tablet; Refill: 1  3. Primary osteoarthritis of both knees Will refill Tramadol now that she is off of narcotics. Follow up with Ortho for possible knee replacement - traMADol (ULTRAM) 50 MG tablet; Take 2 tablets (100 mg total) by mouth 3 (three) times daily.  Dispense: 180 tablet; Refill: 1  4. Type II diabetes mellitus with complication (HCC) Neuropathy and renal disease stable   Partially dictated using Editor, commissioning. Any errors are unintentional.  Halina Maidens, MD [facility]  12/06/2021

## 2021-12-13 ENCOUNTER — Other Ambulatory Visit: Payer: Self-pay | Admitting: Orthopedic Surgery

## 2021-12-21 ENCOUNTER — Encounter: Payer: Self-pay | Admitting: Neurosurgery

## 2021-12-21 ENCOUNTER — Ambulatory Visit: Payer: Medicare Other | Admitting: Neurosurgery

## 2021-12-21 VITALS — BP 130/72 | Temp 97.8°F | Ht 68.0 in | Wt 235.2 lb

## 2021-12-21 DIAGNOSIS — M5416 Radiculopathy, lumbar region: Secondary | ICD-10-CM

## 2021-12-21 NOTE — Progress Notes (Signed)
   DOS: 11/08/21 right L5-S1 microdiscectomy   HISTORY OF PRESENT ILLNESS: 12/21/2021 [name] is status post right-sided L5-S1 microdiscectomy.  She is doing extremely well.  She has dull achy back pain, but her leg pain is completely gone.  She is very happy with her improvement.Marland Kitchen   PHYSICAL EXAMINATION:   Vitals:   12/21/21 0951  BP: 130/72  Temp: 97.8 F (36.6 C)   General: Patient is well developed, well nourished, calm, collected, and in no apparent distress.  NEUROLOGICAL:  General: In no acute distress.  Awake, alert, oriented to person, place, and time. Pupils equal round and reactive to light.   Strength:  Side Iliopsoas Quads Hamstring PF DF EHL  R '5 5 5 5 5 5  '$ L '5 5 5 5 5 5   '$ Incision c/d/i   ROS (Neurologic): Negative except as noted above  IMAGING: No interval imaging to review   ASSESSMENT/PLAN:  Kendra Walton is doing well after microdiscectomy.  We reviewed activity limitations.  She is now on a 25 pound lifting limit for the next 6 weeks.  She is having her knee replaced in 2 weeks.  She will be back on limitations per Dr. Rudene Christians after that.  She will be off limitations from my standpoint by the end of August.  We will set up a telephone appointment in 6 to 8 weeks to monitor her progress.  I spent a total of 10 in face-to-face and non-face-to-face activities related to this patient's care today.   Meade Maw MD, [facility] Department of Neurosurgery

## 2021-12-26 ENCOUNTER — Encounter
Admission: RE | Admit: 2021-12-26 | Discharge: 2021-12-26 | Disposition: A | Discharge status: Home / Self Care | Payer: Medicare Other | Source: Ambulatory Visit | Attending: Orthopedic Surgery | Admitting: Orthopedic Surgery

## 2021-12-26 DIAGNOSIS — Z01818 Encounter for other preprocedural examination: Secondary | ICD-10-CM | POA: Insufficient documentation

## 2021-12-26 DIAGNOSIS — Z01810 Encounter for preprocedural cardiovascular examination: Secondary | ICD-10-CM | POA: Diagnosis not present

## 2021-12-26 DIAGNOSIS — Z01812 Encounter for preprocedural laboratory examination: Secondary | ICD-10-CM

## 2021-12-26 LAB — CBC WITH DIFFERENTIAL/PLATELET
Abs Immature Granulocytes: 0.04 10*3/uL (ref 0.00–0.07)
Basophils Absolute: 0.1 10*3/uL (ref 0.0–0.1)
Basophils Relative: 1 %
Eosinophils Absolute: 0.3 10*3/uL (ref 0.0–0.5)
Eosinophils Relative: 3 %
HCT: 32.2 % — ABNORMAL LOW (ref 36.0–46.0)
Hemoglobin: 10 g/dL — ABNORMAL LOW (ref 12.0–15.0)
Immature Granulocytes: 0 %
Lymphocytes Relative: 19 %
Lymphs Abs: 2.1 10*3/uL (ref 0.7–4.0)
MCH: 26.7 pg (ref 26.0–34.0)
MCHC: 31.1 g/dL (ref 30.0–36.0)
MCV: 85.9 fL (ref 80.0–100.0)
Monocytes Absolute: 0.8 10*3/uL (ref 0.1–1.0)
Monocytes Relative: 7 %
Neutro Abs: 7.6 10*3/uL (ref 1.7–7.7)
Neutrophils Relative %: 70 %
Platelets: 269 10*3/uL (ref 150–400)
RBC: 3.75 MIL/uL — ABNORMAL LOW (ref 3.87–5.11)
RDW: 14.3 % (ref 11.5–15.5)
WBC: 10.9 10*3/uL — ABNORMAL HIGH (ref 4.0–10.5)
nRBC: 0 % (ref 0.0–0.2)

## 2021-12-26 LAB — URINALYSIS, ROUTINE W REFLEX MICROSCOPIC
Bilirubin Urine: NEGATIVE
Glucose, UA: NEGATIVE mg/dL
Hgb urine dipstick: NEGATIVE
Ketones, ur: 5 mg/dL — AB
Nitrite: NEGATIVE
Protein, ur: 30 mg/dL — AB
Specific Gravity, Urine: 1.025 (ref 1.005–1.030)
pH: 5 (ref 5.0–8.0)

## 2021-12-26 LAB — TYPE AND SCREEN
ABO/RH(D): O NEG
Antibody Screen: NEGATIVE

## 2021-12-26 LAB — COMPREHENSIVE METABOLIC PANEL
ALT: 64 U/L — ABNORMAL HIGH (ref 0–44)
AST: 62 U/L — ABNORMAL HIGH (ref 15–41)
Albumin: 3.8 g/dL (ref 3.5–5.0)
Alkaline Phosphatase: 71 U/L (ref 38–126)
Anion gap: 10 (ref 5–15)
BUN: 23 mg/dL (ref 8–23)
CO2: 24 mmol/L (ref 22–32)
Calcium: 9.2 mg/dL (ref 8.9–10.3)
Chloride: 106 mmol/L (ref 98–111)
Creatinine, Ser: 1.07 mg/dL — ABNORMAL HIGH (ref 0.44–1.00)
GFR, Estimated: 55 mL/min — ABNORMAL LOW (ref >60)
Glucose, Bld: 140 mg/dL — ABNORMAL HIGH (ref 70–99)
Potassium: 4.3 mmol/L (ref 3.5–5.1)
Sodium: 140 mmol/L (ref 135–145)
Total Bilirubin: 0.5 mg/dL (ref 0.3–1.2)
Total Protein: 7.4 g/dL (ref 6.5–8.1)

## 2021-12-26 LAB — SURGICAL PCR SCREEN
MRSA, PCR: NEGATIVE
Staphylococcus aureus: NEGATIVE

## 2021-12-26 NOTE — Patient Instructions (Addendum)
Your procedure is scheduled on: Thursday, July 20 Report to the Registration Desk on the 1st floor of the [facility]. To find out your arrival time, please call [phone] between 1PM - 3PM on: Wednesday, July 19 If your arrival time is 6:00 am, do not arrive prior to that time as the [facility] entrance doors do not open until 6:00 am.  REMEMBER: Instructions that are not followed completely may result in serious medical risk, up to and including death; or upon the discretion of your surgeon and anesthesiologist your surgery may need to be rescheduled.  Do not eat food after midnight the night before surgery.  No gum chewing, lozengers or hard candies.  You may however, drink water up to 2 hours before you are scheduled to arrive for your surgery. Do not drink anything within 2 hours of your scheduled arrival time.  In addition, your doctor has ordered for you to drink the provided  Gatorade G2 Drinking this carbohydrate drink up to two hours before surgery helps to reduce insulin resistance and improve patient outcomes. Please complete drinking 2 hours prior to scheduled arrival time.  TAKE THESE MEDICATIONS THE MORNING OF SURGERY WITH A SIP OF WATER:  Levothyroxine Lyrica Tramadol if needed for pain  Stop metformin 2 day prior to surgery. Last day to take metformin is Monday, July 17. Resume AFTER surgery.  One week prior to surgery: starting July 13 Stop Anti-inflammatories (NSAIDS) such as Advil, Aleve, Ibuprofen, Motrin, Naproxen, Naprosyn and Aspirin based products such as Excedrin, Goodys Powder, BC Powder. Stop ANY OVER THE COUNTER supplements until after surgery. You may however, continue to take Tylenol if needed for pain up until the day of surgery.  No Alcohol for 24 hours before or after surgery.  No Smoking including e-cigarettes for 24 hours prior to surgery.  No chewable tobacco products for at least 6 hours prior to surgery.  No nicotine patches on the day  of surgery.  Do not use any "recreational" drugs for at least a week prior to your surgery.  Please be advised that the combination of cocaine and anesthesia may have negative outcomes, up to and including death. If you test positive for cocaine, your surgery will be cancelled.  On the morning of surgery brush your teeth with toothpaste and water, you may rinse your mouth with mouthwash if you wish. Do not swallow any toothpaste or mouthwash.  Use CHG Soap as directed on instruction sheet.  Do not wear jewelry, make-up, hairpins, clips or nail polish.  Do not wear lotions, powders, or perfumes.   Do not shave body from the neck down 48 hours prior to surgery just in case you cut yourself which could leave a site for infection.  Also, freshly shaved skin may become irritated if using the CHG soap.  Contact lenses, hearing aids and dentures may not be worn into surgery.  Do not bring valuables to the hospital. [facility] is not responsible for any missing/lost belongings or valuables.   Notify your doctor if there is any change in your medical condition (cold, fever, infection).  Wear comfortable clothing (specific to your surgery type) to the hospital.  After surgery, you can help prevent lung complications by doing breathing exercises.  Take deep breaths and cough every 1-2 hours. Your doctor may order a device called an Incentive Spirometer to help you take deep breaths.  If you are being admitted to the hospital overnight, leave your suitcase in the car. After surgery  it may be brought to your room.  If you are being discharged the day of surgery, you will not be allowed to drive home. You will need a responsible adult (18 years or older) to drive you home and stay with you that night.   If you are taking public transportation, you will need to have a responsible adult (18 years or older) with you. Please confirm with your physician that it is acceptable to use public  transportation.   Please call the [facility] at [phone] if you have any questions about these instructions.  Surgery Visitation Policy:  Patients undergoing a surgery or procedure may have two family members or support persons with them as long as the person is not COVID-19 positive or experiencing its symptoms.   Inpatient Visitation:    Visiting hours are 7 a.m. to 8 p.m. Up to four visitors are allowed at one time in a patient room, including children. The visitors may rotate out with other people during the day. One designated support person (adult) may remain overnight.   Preparing for Surgery with [place] (CHG) Soap    Before surgery, you can play an important role by reducing the number of germs on your skin.  CHG (Chlorhexidine gluconate) soap is an antiseptic cleanser which kills germs and bonds with the skin to continue killing germs even after washing.  Please do not use if you have an allergy to CHG or antibacterial soaps. If your skin becomes reddened/irritated stop using the CHG.  1. Shower the NIGHT BEFORE SURGERY and the MORNING OF SURGERY with CHG soap.  2. If you choose to wash your hair, wash your hair first as usual with your normal shampoo.  3. After shampooing, rinse your hair and body thoroughly to remove the shampoo.  4. Use CHG as you would any other liquid soap. You can apply CHG directly to the skin and wash gently with a scrungie or a clean washcloth.  5. Apply the CHG soap to your body only from the neck down. Do not use on open wounds or open sores. Avoid contact with your eyes, ears, mouth, and genitals (private parts). Wash face and genitals (private parts) with your normal soap.  6. Wash thoroughly, paying special attention to the area where your surgery will be performed.  7. Thoroughly rinse your body with warm water.  8. Do not shower/wash with your normal soap after using and rinsing off the CHG  soap.  9. Pat yourself dry with a clean towel.  10. Wear clean pajamas to bed the night before surgery.  12. Place clean sheets on your bed the night of your first shower and do not sleep with pets.  13. Shower again with the CHG soap on the day of surgery prior to arriving at the hospital.  14. Do not apply any deodorants/lotions/powders.  15. Please wear clean clothes to the hospital.

## 2021-12-27 DIAGNOSIS — M1711 Unilateral primary osteoarthritis, right knee: Secondary | ICD-10-CM | POA: Diagnosis not present

## 2021-12-27 NOTE — Progress Notes (Signed)
Perioperative Services  Pre-Admission/Anesthesia Testing Clinical Review  Date: 01/03/22  Patient Demographics:  Name: Kendra Walton DOB:   1950/06/22 MRN:   [id number]  Planned Surgical Procedure(s):    Case: [id number] Date/Time: 01/04/22 1001   Procedure: TOTAL KNEE ARTHROPLASTY (Right: Knee)   Anesthesia type: Choice   Pre-op diagnosis: Primary osteoarthritis of right knee M17.11   Location: [facility] OR ROOM 01 / [place] [facility]S FOR ANESTHESIA GROUP   Surgeons: Hessie Knows, MD   NOTE: Available PAT nursing documentation and vital signs have been reviewed. Clinical nursing staff has updated patient's PMH/PSHx, current medication list, and drug allergies/intolerances to ensure comprehensive history available to assist in medical decision making as it pertains to the aforementioned surgical procedure and anticipated anesthetic course. Extensive review of available clinical information performed.  PMH and PSHx updated with any diagnoses/procedures that  may have been inadvertently omitted during her intake with the pre-admission testing department's nursing staff.  Clinical Discussion:  Kendra Walton is a 71 y.o. female who is submitted for pre-surgical anesthesia review and clearance prior to her undergoing the above procedure. . Patient is a Former Smoker (40 pack years; quit 06/2012). Pertinent PMH includes: non-obstructive coronary artery calcification, severe aortic valve stenosis (s/p TAVR), CHF, HTN, HLD, T2DM, hypothyroidism, CKD-III,  absolute anemia, leukocytosis, neuropathy, RIGHT breast cancer (s/p mastectomy), enlarging RIGHT adrenal mass.    Patient is followed by cardiology Ubaldo Glassing, MD). She was last seen in the cardiology clinic on 02/12/2019; notes reviewed. At the time of her clinic visit, the patient denied any chest pain, shortness of breath, PND, orthopnea, palpitations, significant peripheral edema, vertiginous symptoms, or presyncope/syncope.  Patient  with a PMH significant for cardiovascular diagnoses.   Diagnostic right/left heart catheterization performed on 08/06/2018 revealing no significant obstructive CAD; 20% stenosis of the ostial ramus intermedius. Ejection fraction reduced at 35%.  There was severe aortic valve stenosis with a mean gradient of 48.2 mmHg; AVA (VTI) = 0.58 cm. Mean PA = 36 mmHg, mean PCWP = 27 mmHg, PVR 1.78 WU, CO 5.06 L/min, CI 2.25 L/min/m. Patient referred to CVTS for consideration of AVR.   Patient underwent TAVR on 08/18/2018 at [facility].  Procedure performed via a LEFT axillary approach.  26 mm Medtronic Corevalve Evolute placed.   Blood pressure  mildly elevated at 148/77 on prescribed ACEi and diuretic therapies. She is on a statin for her HLD diagnosis and further ASCVD prevention. T2DM well controlled with diet and lifestyle modifications alone. Last HgbA1c was 6.0% when checked on 12/06/2021. Functional capacity, as defined by DASI, is documented as being >/= 4 METS. No changes were made her daily medication regimen. Patient to follow up with outpatient cardiology in 1 year.   Since that time, patient has followed up for recommended imaging only. She has not been seen back in the cardiology clinic. In preparation for her procedure, patient was sent for repeat noninvasive cardiovascular testing in order to better risk stratify her for the upcoming procedure.   Last TTE was performed on 08/17/2021 revealing normal left ventricular systolic function with mild concentric LVH. Diastolic Doppler parameters consistent with abnormal relaxation (G1DD).  Left atrium mildly enlarged.  There was trivial mitral, tricuspid, and pulmonary valve regurgitation. Prosthetic aortic valve well-seated with normal function; mean gradient 12.0 mmHg.  Kendra Walton is scheduled for an elective RIGHT TOTAL KNEE ARTHROPLASTY on 01/04/2022 with Dr. Hessie Knows, MD. Given patient's past medical history  significant for cardiovascular diagnoses, presurgical cardiac clearance was  sought by the PAT team. Patient previously cleared for neurosurgical procedure back in 10/2021. She did well intraoperatively, with no documented complications. She has continued to do well postoperatively. She denies any angina/anginal equivalent symptoms. Based on previous clearance, per cardiology, "this patient is optimized for surgery and may proceed with the planned procedural course with a LOW risk of significant perioperative cardiovascular complications". In review of her medication reconciliation, it is noted the patient is on daily antiplatelet therapy. Given cardiovascular history, patient to continue her daily low dose ASA throughout her perioperative course.    Patient denies previous perioperative complications with anesthesia in the past. In review of the available records, it is noted that patient underwent a general anesthetic course here at [facility] (ASA III) in 10/2021 without documented complications.      12/26/2021   11:21 AM 12/21/2021    9:51 AM 12/06/2021   10:00 AM  Vitals with BMI  Height 5' 8"  5' 8"  5' 8"   Weight 238 lbs 235 lbs 3 oz 236 lbs  BMI 36.2 57.01 77.93  Systolic  903 94  Diastolic  72 68  Pulse   93    Providers/Specialists:   NOTE: Primary physician provider listed below. Patient may have been seen by APP or partner within same practice.   PROVIDER ROLE / SPECIALTY LAST Fabio Bering, MD Orthopedics(Surgeon) 12/27/2021  Glean Hess, MD Primary Care Provider 12/06/2021  Bartholome Bill, MD Cardiology 02/12/2019  Mee Hives, MD Endocrinology 02/13/2021  Meade Maw, MD Neurosurgery 12/21/2021   Allergies:  Shellfish allergy  Current Home Medications:   No current facility-administered medications for this encounter.    atorvastatin (LIPITOR) 20 MG tablet   glucose blood (ONE TOUCH ULTRA TEST) test strip   Lancets  (ONETOUCH DELICA PLUS [id number]) MISC   levothyroxine (SYNTHROID) 150 MCG tablet   lisinopril-hydrochlorothiazide (ZESTORETIC) 10-12.5 MG tablet   metFORMIN (GLUCOPHAGE-XR) 500 MG 24 hr tablet   methocarbamol (ROBAXIN) 750 MG tablet   Polyvinyl Alcohol-Povidone (REFRESH OP)   pregabalin (LYRICA) 50 MG capsule   senna (SENOKOT) 8.6 MG TABS tablet   traMADol (ULTRAM) 50 MG tablet   History:   Past Medical History:  Diagnosis Date   Absolute anemia 09/15/2015   Patient declines colonoscopy    Aortic stenosis, moderate 04/05/2017   a.) s/p TAVR 08/18/2018; LEFT axillary approach   Arthritis    Breast cancer, right ([place]) 2010   a.) s/p RIGHT mastectomy; no chemotherpay or XRT   CAD (coronary artery disease) 08/06/2018   a.) R/LHC 08/06/2018: EF 35%; 20% stenosis oRI; severe AS (MPG 48.2 mmHg; AVA (VTI) = 0.58 cm); mean PA = 36 mmHg, mean PCWP = 27 mmHg, PVR 1.78 WU, CO 5.06 L/min, CI 2.25 L/min/m   CHF (congestive heart failure) ([place])    a.)  TTE 07/29/2018: EF 35%; moderate global HK; moderate LVH; mild LA dilation; trivial TR/PR, mild AR/MR; moderate AS (MPG 48.2 mmHg); G2DD. b.) R/LHC 08/06/2018; EF 35%; mean PCWP 27 mmHg. c.)  TTE 10/20/2019: EF >55%; mild LVH; trivial MR/TR, mild PR, prostatic AoV. d.)  TTE 08/17/2021: EF >55%; mild LVH; mild LA dilation; trivial MR/TR/PR; prostatic AoV (MPG 12 mmHg); G1DD.   CKD (chronic kidney disease), stage III (HCC)    Ectopic pregnancy, tubal    History of transcatheter aortic valve replacement (TAVR) 08/18/2018   a.) 26 mm Medtronic Corevalve Evolute   HLD (hyperlipidemia)    Hypertension    Hypothyroidism  Leukocytosis 09/15/2015   Hematology eval - likely benign; follow up planned    Neuropathy    Post-menopausal bleeding 04/13/2016   US showed thickened endometrium; biopsy recommended but patient declined   Right adrenal mass ([place]) 02/06/2019   a.) CT CAP 08/14/2018: measured 4.3 x 3.3 cm. b.) CT abd 02/06/2019: measured 4.7 x 3.7  cm. c.) MRI lumbar spine 10/25/2021: interval increase to 7.1 x 7.1 cm.   T2DM (type 2 diabetes mellitus) ([place])    Past Surgical History:  Procedure Laterality Date   APPENDECTOMY     CATARACT EXTRACTION W/ INTRAOCULAR LENS  IMPLANT, BILATERAL  2010   CESAREAN SECTION  1983   CHOLECYSTECTOMY  2005   ECTOPIC PREGNANCY SURGERY     LUMBAR LAMINECTOMY/DECOMPRESSION MICRODISCECTOMY Right 11/08/2021   Procedure: RIGHT L5-S1 MICRODISCECTOMY;  Surgeon: Meade Maw, MD;  Location: [facility] ORS;  Service: Neurosurgery;  Laterality: Right;   MASTECTOMY, PARTIAL Right 2010   REDUCTION MAMMAPLASTY Left 2011   RIGHT/LEFT HEART CATH AND CORONARY ANGIOGRAPHY Bilateral 08/06/2018   Procedure: RIGHT/LEFT HEART CATH AND CORONARY ANGIOGRAPHY;  Surgeon: Teodoro Spray, MD;  Location: [facility];  Service: Cardiovascular;  Laterality: Bilateral;   TRANSCATHETER AORTIC VALVE REPLACEMENT, TRANSAPICAL  08/18/2018   Procedure: TRANSCATHETER AORTIC VALVE REPLACEMENT; Location: [facility]; Surgeon: Sharmon Leyden, MD   Family History  Problem Relation Age of Onset   Hypertension Brother    Diabetes Brother    Breast cancer Neg Hx    Social History   Tobacco Use   Smoking status: Former    Packs/day: 1.00    Years: 40.00    Total pack years: 40.00    Types: Cigarettes    Quit date: 06/18/2012    Years since quitting: 9.5   Smokeless tobacco: Never  Vaping Use   Vaping Use: Never used  Substance Use Topics   Alcohol use: No    Alcohol/week: 0.0 standard drinks of alcohol   Drug use: No    Pertinent Clinical Results:  LABS: Labs reviewed: Acceptable for surgery.  Lab Results  Component Value Date   WBC 10.9 (H) 12/26/2021   HGB 10.0 (L) 12/26/2021   HCT 32.2 (L) 12/26/2021   MCV 85.9 12/26/2021   PLT 269 12/26/2021    Lab Results  Component Value Date   NA 140 12/26/2021   K 4.3 12/26/2021   CO2 24 12/26/2021   GLUCOSE 140 (H) 12/26/2021   BUN 23 12/26/2021   CREATININE 1.07 (H)  12/26/2021   CALCIUM 9.2 12/26/2021   EGFR 53 (L) 06/20/2021   GFRNONAA 55 (L) 12/26/2021   Lab Results  Component Value Date   HGBA1C 6.0 (A) 12/06/2021    Date: 12/26/2021 Time ECG obtained: 1136 AM Rate: 58 bpm Rhythm: Normal sinus rhythm Axis (leads I and aVF): Normal Intervals: PR 166 ms. QRS 102 ms. QTc 437 ms. ST segment and T wave changes: Non-specific ST abnormality noted. Evidence of an age undetermined anterior infarct present Comparison: No significant changes when compared to tracing obtained on 08/03/2021.    IMAGING / PROCEDURES: MR LUMBAR SPINE WO CONTRAST performed on 10/25/2021 L5-S1 right subarticular disc extrusion with caudal migration, which contacts and compresses the descending right S1 nerve roots. At this level there is also mild right neural foraminal narrowing. L4-L5 mild spinal canal stenosis. Severe facet arthropathy at this level canal so be a cause of back pain 7.1 cm right adrenal mass, which previously measured up to 4.8 cm on 02/09/2019. A cystic and solid  mass is favored given its interval growth, and a dedicated adrenal protocol MRI is recommended.  TRANSTHORACIC ECHOCARDIOGRAM performed on 08/17/2021 LVEF >55% Normal left ventricular systolic function with mild LVH LA mildly enlarged Diastolic Doppler parameters consistent with abnormal relaxation (G1DD). Normal right ventricular systolic function Trivial MR, TR, PR Mild AR No valvular stenosis Well-seated and normal functioning prosthetic aortic valve No evidence of a pericardial effusion  CT RIGHT KNEE WITHOUT CONTRAST performed on 06/28/2021 Moderate to severe tricompartmental osteoarthritis of the right knee most pronounced within the medial compartment with joint space narrowing, subchondral sclerosis, and marginal osteophyte formation. Trace knee joint effusion  MR KNEE RIGHT WO CONTRAST performed on 05/10/2021 Tears of the medial meniscus body and posterior horn. Degenerative  changes of the medial compartment and patellofemoral joint as described. Small joint effusion.  DIAGNOSTIC RADIOGRAPHS OF RIGHT KNEE 4+ VIEWS performed on 04/25/2021 Medial compartment narrowing with no complete bone-on-bone pathology The PA flexed view shows more narrowing medially with still no bone-on-bone pathology   RIGHT/LEFT HEART CATHETERIZATION AND CORONARY ANGIOGRAPHY performed on 08/06/2018 Nonobstructive CAD 20% stenosis of the ostial ramus intermedius LVEF 35% Severe aortic valve stenosis with a mean pressure gradient of 48.2 mmHg; AVA (VTI) = 0.58 cm Mean pulmonary wedge pressure 27 mmHg Refer to CVTS for consideration of AVR  Impression and Plan:  Kendra Walton has been referred for pre-anesthesia review and clearance prior to her undergoing the planned anesthetic and procedural courses. Available labs, pertinent testing, and imaging results were personally reviewed by me. This patient has been appropriately cleared by cardiology with an overall LOW risk of significant perioperative cardiovascular complications.  Based on clinical review performed today (01/03/22), barring any significant acute changes in the patient's overall condition, it is anticipated that she will be able to proceed with the planned surgical intervention. Any acute changes in clinical condition may necessitate her procedure being postponed and/or cancelled. Patient will meet with anesthesia team (MD and/or CRNA) on the day of her procedure for preoperative evaluation/assessment. Questions regarding anesthetic course will be fielded at that time.   Pre-surgical instructions were reviewed with the patient during her PAT appointment and questions were fielded by PAT clinical staff. Patient was advised that if any questions or concerns arise prior to her procedure then she should return a call to PAT and/or her surgeon's office to discuss.  Honor Loh, MSN, APRN, FNP-C, CEN [facility]  Peri-operative Services Nurse Practitioner Phone: [phone] Fax: [phone] 01/03/22 4:56 PM  NOTE: This note has been prepared using Dragon dictation software. Despite my best ability to proofread, there is always the potential that unintentional transcriptional errors may still occur from this process.

## 2022-01-04 ENCOUNTER — Ambulatory Visit: Payer: Medicare Other | Admitting: Urgent Care

## 2022-01-04 ENCOUNTER — Encounter
Admission: RE | Disposition: A | Discharge status: Home Health | Payer: Self-pay | Source: Ambulatory Visit | Attending: Orthopedic Surgery

## 2022-01-04 ENCOUNTER — Encounter: Payer: Self-pay | Admitting: Orthopedic Surgery

## 2022-01-04 ENCOUNTER — Observation Stay: Payer: Medicare Other

## 2022-01-04 ENCOUNTER — Other Ambulatory Visit: Payer: Self-pay

## 2022-01-04 ENCOUNTER — Observation Stay
Admission: RE | Admit: 2022-01-04 | Discharge: 2022-01-05 | Disposition: A | Discharge status: Home Health | Payer: Medicare Other | Source: Ambulatory Visit | Attending: Orthopedic Surgery | Admitting: Orthopedic Surgery

## 2022-01-04 DIAGNOSIS — I5023 Acute on chronic systolic (congestive) heart failure: Secondary | ICD-10-CM | POA: Insufficient documentation

## 2022-01-04 DIAGNOSIS — Z79890 Hormone replacement therapy (postmenopausal): Secondary | ICD-10-CM | POA: Insufficient documentation

## 2022-01-04 DIAGNOSIS — Z96651 Presence of right artificial knee joint: Secondary | ICD-10-CM

## 2022-01-04 DIAGNOSIS — N1831 Chronic kidney disease, stage 3a: Secondary | ICD-10-CM | POA: Insufficient documentation

## 2022-01-04 DIAGNOSIS — I130 Hypertensive heart and chronic kidney disease with heart failure and stage 1 through stage 4 chronic kidney disease, or unspecified chronic kidney disease: Secondary | ICD-10-CM | POA: Insufficient documentation

## 2022-01-04 DIAGNOSIS — I2510 Atherosclerotic heart disease of native coronary artery without angina pectoris: Secondary | ICD-10-CM | POA: Diagnosis not present

## 2022-01-04 DIAGNOSIS — E1122 Type 2 diabetes mellitus with diabetic chronic kidney disease: Secondary | ICD-10-CM | POA: Diagnosis not present

## 2022-01-04 DIAGNOSIS — Z471 Aftercare following joint replacement surgery: Secondary | ICD-10-CM | POA: Diagnosis not present

## 2022-01-04 DIAGNOSIS — Z7982 Long term (current) use of aspirin: Secondary | ICD-10-CM | POA: Diagnosis not present

## 2022-01-04 DIAGNOSIS — Z79899 Other long term (current) drug therapy: Secondary | ICD-10-CM | POA: Insufficient documentation

## 2022-01-04 DIAGNOSIS — Z01812 Encounter for preprocedural laboratory examination: Secondary | ICD-10-CM

## 2022-01-04 DIAGNOSIS — E039 Hypothyroidism, unspecified: Secondary | ICD-10-CM | POA: Diagnosis not present

## 2022-01-04 DIAGNOSIS — Z87891 Personal history of nicotine dependence: Secondary | ICD-10-CM | POA: Diagnosis not present

## 2022-01-04 DIAGNOSIS — M1711 Unilateral primary osteoarthritis, right knee: Principal | ICD-10-CM | POA: Insufficient documentation

## 2022-01-04 DIAGNOSIS — Z7984 Long term (current) use of oral hypoglycemic drugs: Secondary | ICD-10-CM | POA: Diagnosis not present

## 2022-01-04 DIAGNOSIS — M25461 Effusion, right knee: Secondary | ICD-10-CM | POA: Diagnosis not present

## 2022-01-04 DIAGNOSIS — Z853 Personal history of malignant neoplasm of breast: Secondary | ICD-10-CM | POA: Insufficient documentation

## 2022-01-04 HISTORY — PX: TOTAL KNEE ARTHROPLASTY: SHX125

## 2022-01-04 LAB — GLUCOSE, CAPILLARY
Glucose-Capillary: 145 mg/dL — ABNORMAL HIGH (ref 70–99)
Glucose-Capillary: 120 mg/dL — ABNORMAL HIGH (ref 70–99)
Glucose-Capillary: 117 mg/dL — ABNORMAL HIGH (ref 70–99)
Glucose-Capillary: 133 mg/dL — ABNORMAL HIGH (ref 70–99)
Glucose-Capillary: 97 mg/dL (ref 70–99)

## 2022-01-04 LAB — CBC
HCT: 28.9 % — ABNORMAL LOW (ref 36.0–46.0)
Hemoglobin: 9 g/dL — ABNORMAL LOW (ref 12.0–15.0)
MCH: 26.5 pg (ref 26.0–34.0)
MCHC: 31.1 g/dL (ref 30.0–36.0)
MCV: 85.3 fL (ref 80.0–100.0)
Platelets: 259 10*3/uL (ref 150–400)
RBC: 3.39 MIL/uL — ABNORMAL LOW (ref 3.87–5.11)
RDW: 14 % (ref 11.5–15.5)
WBC: 10.5 10*3/uL (ref 4.0–10.5)
nRBC: 0 % (ref 0.0–0.2)

## 2022-01-04 LAB — CREATININE, SERUM
Creatinine, Ser: 1 mg/dL (ref 0.44–1.00)
GFR, Estimated: 59 mL/min — ABNORMAL LOW (ref >60)

## 2022-01-04 SURGERY — ARTHROPLASTY, KNEE, TOTAL
Anesthesia: Spinal | Site: Knee | Laterality: Right

## 2022-01-04 MED ORDER — PROPOFOL 1000 MG/100ML IV EMUL
INTRAVENOUS | Status: AC
  Filled 2022-01-04: qty 100

## 2022-01-04 MED ORDER — ACETAMINOPHEN 325 MG PO TABS: 325.0000 mg | ORAL_TABLET | Freq: Four times a day (QID) | ORAL | Status: DC | PRN

## 2022-01-04 MED ORDER — PREGABALIN 50 MG PO CAPS: 100.0000 mg | ORAL_CAPSULE | Freq: Four times a day (QID) | ORAL | Status: DC

## 2022-01-04 MED ORDER — ALUM & MAG HYDROXIDE-SIMETH 200-200-20 MG/5ML PO SUSP: 30.0000 mL | ORAL | Status: DC | PRN

## 2022-01-04 MED ORDER — PROPOFOL 10 MG/ML IV BOLUS
INTRAVENOUS | Status: DC | PRN
  Administered 2022-01-04: 20 mg via INTRAVENOUS

## 2022-01-04 MED ORDER — INSULIN ASPART 100 UNIT/ML IJ SOLN
0.0000 [IU] | Freq: Three times a day (TID) | INTRAMUSCULAR | Status: DC
  Administered 2022-01-04 – 2022-01-05 (×2): 2 [IU] via SUBCUTANEOUS
  Filled 2022-01-04 (×2): qty 1

## 2022-01-04 MED ORDER — HYDROMORPHONE HCL 1 MG/ML IJ SOLN: 0.5000 mg | INTRAMUSCULAR | Status: DC | PRN

## 2022-01-04 MED ORDER — OXYCODONE HCL 5 MG/5ML PO SOLN: 5.0000 mg | Freq: Once | ORAL | Status: DC | PRN

## 2022-01-04 MED ORDER — SODIUM CHLORIDE FLUSH 0.9 % IV SOLN
INTRAVENOUS | Status: AC
Start: 2022-01-04 — End: ?
  Filled 2022-01-04: qty 80

## 2022-01-04 MED ORDER — BUPIVACAINE HCL (PF) 0.5 % IJ SOLN
INTRAMUSCULAR | Status: DC | PRN
  Administered 2022-01-04: 2.5 mL via INTRATHECAL

## 2022-01-04 MED ORDER — PHENYLEPHRINE HCL-NACL 20-0.9 MG/250ML-% IV SOLN
INTRAVENOUS | Status: DC | PRN
  Administered 2022-01-04: 25 ug/min via INTRAVENOUS

## 2022-01-04 MED ORDER — OXYCODONE HCL 5 MG PO TABS
10.0000 mg | ORAL_TABLET | ORAL | Status: DC | PRN
  Administered 2022-01-05 (×2): 15 mg via ORAL
  Filled 2022-01-04 (×3): qty 3

## 2022-01-04 MED ORDER — DOCUSATE SODIUM 100 MG PO CAPS
100.0000 mg | ORAL_CAPSULE | Freq: Two times a day (BID) | ORAL | Status: DC
  Administered 2022-01-04 – 2022-01-05 (×3): 100 mg via ORAL
  Filled 2022-01-04 (×3): qty 1

## 2022-01-04 MED ORDER — HYDROCHLOROTHIAZIDE 12.5 MG PO TABS
12.5000 mg | ORAL_TABLET | Freq: Every day | ORAL | Status: DC
Start: 2022-01-04 — End: 2022-01-05
  Administered 2022-01-04 – 2022-01-05 (×2): 12.5 mg via ORAL
  Filled 2022-01-04 (×2): qty 1

## 2022-01-04 MED ORDER — ACETAMINOPHEN 500 MG PO TABS
1000.0000 mg | ORAL_TABLET | Freq: Four times a day (QID) | ORAL | Status: AC
  Administered 2022-01-04 (×2): 1000 mg via ORAL
  Filled 2022-01-04 (×2): qty 2

## 2022-01-04 MED ORDER — OXYCODONE HCL 5 MG PO TABS
5.0000 mg | ORAL_TABLET | ORAL | Status: DC | PRN
  Administered 2022-01-04: 10 mg via ORAL
  Administered 2022-01-04: 5 mg via ORAL
  Filled 2022-01-04: qty 2

## 2022-01-04 MED ORDER — PHENYLEPHRINE HCL (PRESSORS) 10 MG/ML IV SOLN
INTRAVENOUS | Status: DC | PRN
  Administered 2022-01-04 (×2): 160 ug via INTRAVENOUS
  Administered 2022-01-04: 80 ug via INTRAVENOUS

## 2022-01-04 MED ORDER — CHLORHEXIDINE GLUCONATE 0.12 % MT SOLN
OROMUCOSAL | Status: AC
  Administered 2022-01-04: 15 mL via OROMUCOSAL
  Filled 2022-01-04: qty 15

## 2022-01-04 MED ORDER — BUPIVACAINE HCL (PF) 0.5 % IJ SOLN
INTRAMUSCULAR | Status: AC
  Filled 2022-01-04: qty 10

## 2022-01-04 MED ORDER — METHOCARBAMOL 1000 MG/10ML IJ SOLN: 500.0000 mg | Freq: Four times a day (QID) | INTRAVENOUS | Status: DC | PRN

## 2022-01-04 MED ORDER — METOCLOPRAMIDE HCL 5 MG/ML IJ SOLN: 5.0000 mg | Freq: Three times a day (TID) | INTRAMUSCULAR | Status: DC | PRN

## 2022-01-04 MED ORDER — POLYVINYL ALCOHOL 1.4 % OP SOLN
1.0000 [drp] | Freq: Every day | OPHTHALMIC | Status: DC
  Administered 2022-01-04 – 2022-01-05 (×2): 1 [drp] via OPHTHALMIC
  Filled 2022-01-04: qty 15

## 2022-01-04 MED ORDER — MORPHINE SULFATE (PF) 10 MG/ML IV SOLN
INTRAVENOUS | Status: AC
  Filled 2022-01-04: qty 1

## 2022-01-04 MED ORDER — FAMOTIDINE 20 MG PO TABS
ORAL_TABLET | ORAL | Status: AC
  Administered 2022-01-04: 20 mg via ORAL
  Filled 2022-01-04: qty 1

## 2022-01-04 MED ORDER — FAMOTIDINE 20 MG PO TABS: 20.0000 mg | ORAL_TABLET | Freq: Once | ORAL | Status: AC

## 2022-01-04 MED ORDER — DROPERIDOL 2.5 MG/ML IJ SOLN: 0.6250 mg | Freq: Once | INTRAMUSCULAR | Status: DC | PRN

## 2022-01-04 MED ORDER — MENTHOL 3 MG MT LOZG: 1.0000 | LOZENGE | OROMUCOSAL | Status: DC | PRN

## 2022-01-04 MED ORDER — KETAMINE HCL 50 MG/5ML IJ SOSY
PREFILLED_SYRINGE | INTRAMUSCULAR | Status: AC
  Filled 2022-01-04: qty 5

## 2022-01-04 MED ORDER — PROPOFOL 500 MG/50ML IV EMUL
INTRAVENOUS | Status: DC | PRN
  Administered 2022-01-04: 100 ug/kg/min via INTRAVENOUS

## 2022-01-04 MED ORDER — SENNOSIDES-DOCUSATE SODIUM 8.6-50 MG PO TABS: 1.0000 | ORAL_TABLET | Freq: Every evening | ORAL | Status: DC | PRN

## 2022-01-04 MED ORDER — MAGNESIUM HYDROXIDE 400 MG/5ML PO SUSP
30.0000 mL | Freq: Every day | ORAL | Status: DC
  Administered 2022-01-04: 30 mL via ORAL
  Filled 2022-01-04: qty 30

## 2022-01-04 MED ORDER — PROMETHAZINE HCL 25 MG/ML IJ SOLN: 6.2500 mg | INTRAMUSCULAR | Status: DC | PRN

## 2022-01-04 MED ORDER — MIDAZOLAM HCL 2 MG/2ML IJ SOLN
INTRAMUSCULAR | Status: AC
  Filled 2022-01-04: qty 2

## 2022-01-04 MED ORDER — LEVOTHYROXINE SODIUM 50 MCG PO TABS: 75.0000 ug | ORAL_TABLET | ORAL | Status: DC

## 2022-01-04 MED ORDER — ORAL CARE MOUTH RINSE: 15.0000 mL | OROMUCOSAL | Status: DC | PRN

## 2022-01-04 MED ORDER — OXYCODONE HCL 5 MG PO TABS: 5.0000 mg | ORAL_TABLET | Freq: Once | ORAL | Status: DC | PRN

## 2022-01-04 MED ORDER — CEFAZOLIN SODIUM-DEXTROSE 2-4 GM/100ML-% IV SOLN
2.0000 g | INTRAVENOUS | Status: AC
  Administered 2022-01-04: 2 g via INTRAVENOUS

## 2022-01-04 MED ORDER — ONDANSETRON HCL 4 MG/2ML IJ SOLN: 4.0000 mg | Freq: Four times a day (QID) | INTRAMUSCULAR | Status: DC | PRN

## 2022-01-04 MED ORDER — NEOMYCIN-POLYMYXIN B GU 40-200000 IR SOLN
Status: AC
  Filled 2022-01-04: qty 20

## 2022-01-04 MED ORDER — SURGIPHOR WOUND IRRIGATION SYSTEM - OPTIME: TOPICAL | Status: DC | PRN

## 2022-01-04 MED ORDER — METHOCARBAMOL 500 MG PO TABS
500.0000 mg | ORAL_TABLET | Freq: Four times a day (QID) | ORAL | Status: DC | PRN
  Administered 2022-01-05: 500 mg via ORAL
  Filled 2022-01-04: qty 1

## 2022-01-04 MED ORDER — ACETAMINOPHEN 10 MG/ML IV SOLN: 1000.0000 mg | Freq: Once | INTRAVENOUS | Status: DC | PRN

## 2022-01-04 MED ORDER — METOCLOPRAMIDE HCL 5 MG PO TABS: 5.0000 mg | ORAL_TABLET | Freq: Three times a day (TID) | ORAL | Status: DC | PRN

## 2022-01-04 MED ORDER — ENOXAPARIN SODIUM 30 MG/0.3ML IJ SOSY
30.0000 mg | PREFILLED_SYRINGE | Freq: Two times a day (BID) | INTRAMUSCULAR | Status: DC
  Administered 2022-01-05: 30 mg via SUBCUTANEOUS
  Filled 2022-01-04: qty 0.3

## 2022-01-04 MED ORDER — BUPIVACAINE LIPOSOME 1.3 % IJ SUSP
INTRAMUSCULAR | Status: DC | PRN
  Administered 2022-01-04: 92 mL via INTRAMUSCULAR

## 2022-01-04 MED ORDER — LEVOTHYROXINE SODIUM 25 MCG PO TABS: 75.0000 ug | ORAL_TABLET | ORAL | Status: DC

## 2022-01-04 MED ORDER — PANTOPRAZOLE SODIUM 40 MG PO TBEC
40.0000 mg | DELAYED_RELEASE_TABLET | Freq: Every day | ORAL | Status: DC
  Administered 2022-01-04 – 2022-01-05 (×2): 40 mg via ORAL
  Filled 2022-01-04 (×2): qty 1

## 2022-01-04 MED ORDER — SODIUM CHLORIDE 0.9 % IV SOLN: INTRAVENOUS | Status: DC

## 2022-01-04 MED ORDER — METFORMIN HCL ER 500 MG PO TB24
1000.0000 mg | ORAL_TABLET | Freq: Two times a day (BID) | ORAL | Status: DC
  Administered 2022-01-04 – 2022-01-05 (×2): 1000 mg via ORAL
  Filled 2022-01-04 (×3): qty 2

## 2022-01-04 MED ORDER — MIDAZOLAM HCL 5 MG/5ML IJ SOLN
INTRAMUSCULAR | Status: DC | PRN
  Administered 2022-01-04 (×2): 1 mg via INTRAVENOUS

## 2022-01-04 MED ORDER — CEFAZOLIN SODIUM-DEXTROSE 2-4 GM/100ML-% IV SOLN
2.0000 g | Freq: Four times a day (QID) | INTRAVENOUS | Status: AC
  Administered 2022-01-04 (×2): 2 g via INTRAVENOUS
  Filled 2022-01-04 (×2): qty 100

## 2022-01-04 MED ORDER — BISACODYL 5 MG PO TBEC: 5.0000 mg | DELAYED_RELEASE_TABLET | Freq: Every day | ORAL | Status: DC | PRN

## 2022-01-04 MED ORDER — SODIUM CHLORIDE 0.9 % IR SOLN: Status: DC | PRN

## 2022-01-04 MED ORDER — BUPIVACAINE-EPINEPHRINE (PF) 0.25% -1:200000 IJ SOLN
INTRAMUSCULAR | Status: AC
  Filled 2022-01-04: qty 60

## 2022-01-04 MED ORDER — KETAMINE HCL 10 MG/ML IJ SOLN
INTRAMUSCULAR | Status: DC | PRN
  Administered 2022-01-04 (×3): 10 mg via INTRAVENOUS

## 2022-01-04 MED ORDER — CHLORHEXIDINE GLUCONATE 0.12 % MT SOLN: 15.0000 mL | Freq: Once | OROMUCOSAL | Status: AC

## 2022-01-04 MED ORDER — LISINOPRIL-HYDROCHLOROTHIAZIDE 10-12.5 MG PO TABS: 1.0000 | ORAL_TABLET | Freq: Every day | ORAL | Status: DC

## 2022-01-04 MED ORDER — ONDANSETRON HCL 4 MG PO TABS: 4.0000 mg | ORAL_TABLET | Freq: Four times a day (QID) | ORAL | Status: DC | PRN

## 2022-01-04 MED ORDER — CEFAZOLIN SODIUM-DEXTROSE 2-4 GM/100ML-% IV SOLN
INTRAVENOUS | Status: AC
  Filled 2022-01-04: qty 100

## 2022-01-04 MED ORDER — PHENOL 1.4 % MT LIQD
1.0000 | OROMUCOSAL | Status: DC | PRN
Start: 2022-01-04 — End: 2022-01-05

## 2022-01-04 MED ORDER — ORAL CARE MOUTH RINSE: 15.0000 mL | Freq: Once | OROMUCOSAL | Status: AC

## 2022-01-04 MED ORDER — PREGABALIN 50 MG PO CAPS
100.0000 mg | ORAL_CAPSULE | Freq: Every morning | ORAL | Status: DC
  Administered 2022-01-05: 100 mg via ORAL
  Filled 2022-01-04: qty 2

## 2022-01-04 MED ORDER — FLEET ENEMA 7-19 GM/118ML RE ENEM: 1.0000 | ENEMA | Freq: Once | RECTAL | Status: DC | PRN

## 2022-01-04 MED ORDER — LISINOPRIL 10 MG PO TABS
10.0000 mg | ORAL_TABLET | Freq: Every day | ORAL | Status: DC
Start: 2022-01-04 — End: 2022-01-05
  Administered 2022-01-04 – 2022-01-05 (×2): 10 mg via ORAL
  Filled 2022-01-04 (×2): qty 1

## 2022-01-04 MED ORDER — DIPHENHYDRAMINE HCL 12.5 MG/5ML PO ELIX: 12.5000 mg | ORAL_SOLUTION | ORAL | Status: DC | PRN

## 2022-01-04 MED ORDER — LEVOTHYROXINE SODIUM 50 MCG PO TABS
150.0000 ug | ORAL_TABLET | ORAL | Status: DC
  Administered 2022-01-05: 150 ug via ORAL
  Filled 2022-01-04: qty 3

## 2022-01-04 MED ORDER — ATORVASTATIN CALCIUM 20 MG PO TABS
20.0000 mg | ORAL_TABLET | Freq: Every day | ORAL | Status: DC
Start: 2022-01-04 — End: 2022-01-05
  Administered 2022-01-04: 20 mg via ORAL
  Filled 2022-01-04: qty 1

## 2022-01-04 MED ORDER — FENTANYL CITRATE (PF) 100 MCG/2ML IJ SOLN: 25.0000 ug | INTRAMUSCULAR | Status: DC | PRN

## 2022-01-04 MED ORDER — KETOROLAC TROMETHAMINE 30 MG/ML IJ SOLN
INTRAMUSCULAR | Status: AC
  Filled 2022-01-04: qty 1

## 2022-01-04 MED ORDER — TRAMADOL HCL 50 MG PO TABS
50.0000 mg | ORAL_TABLET | Freq: Four times a day (QID) | ORAL | Status: DC
  Administered 2022-01-04 – 2022-01-05 (×3): 50 mg via ORAL
  Filled 2022-01-04 (×3): qty 1

## 2022-01-04 MED ORDER — PREGABALIN 50 MG PO CAPS
50.0000 mg | ORAL_CAPSULE | Freq: Two times a day (BID) | ORAL | Status: DC
  Administered 2022-01-04 (×2): 50 mg via ORAL
  Filled 2022-01-04 (×2): qty 1

## 2022-01-04 MED ORDER — PHENYLEPHRINE 80 MCG/ML (10ML) SYRINGE FOR IV PUSH (FOR BLOOD PRESSURE SUPPORT)
PREFILLED_SYRINGE | INTRAVENOUS | Status: AC
  Filled 2022-01-04: qty 10

## 2022-01-04 MED ORDER — ZOLPIDEM TARTRATE 5 MG PO TABS
5.0000 mg | ORAL_TABLET | Freq: Every evening | ORAL | Status: DC | PRN
  Administered 2022-01-05: 5 mg via ORAL
  Filled 2022-01-04: qty 1

## 2022-01-04 MED ORDER — BUPIVACAINE LIPOSOME 1.3 % IJ SUSP
INTRAMUSCULAR | Status: AC
  Filled 2022-01-04: qty 40

## 2022-01-04 SURGICAL SUPPLY — 72 items
BLADE SAGITTAL 25.0X1.19X90 (BLADE) ×2 IMPLANT
BLADE SAW 90X13X1.19 OSCILLAT (BLADE) ×2 IMPLANT
BLOCK CUTTING FEMUR 3 RT MED (MISCELLANEOUS) ×1 IMPLANT
BLOCK CUTTING FEMUR 3+ RT MED (MISCELLANEOUS) ×1 IMPLANT
BLOCK CUTTING TIBIAL 3 RT (MISCELLANEOUS) ×1 IMPLANT
BLOCK CUTTING TIBIAL 4 RT MIS (MISCELLANEOUS) ×1 IMPLANT
BNDG ELASTIC 6X5.8 VLCR STR LF (GAUZE/BANDAGES/DRESSINGS) ×2 IMPLANT
CANISTER WOUND CARE 500ML ATS (WOUND CARE) ×2 IMPLANT
CEMENT HV SMART SET (Cement) ×4 IMPLANT
CHLORAPREP W/TINT 26 (MISCELLANEOUS) ×4 IMPLANT
COOLER POLAR GLACIER W/PUMP (MISCELLANEOUS) ×2 IMPLANT
CUFF TOURN SGL QUICK 24 (TOURNIQUET CUFF)
CUFF TOURN SGL QUICK 34 (TOURNIQUET CUFF)
CUFF TRNQT CYL 24X4X16.5-23 (TOURNIQUET CUFF) IMPLANT
CUFF TRNQT CYL 34X4.125X (TOURNIQUET CUFF) IMPLANT
DRAPE 3/4 80X56 (DRAPES) ×4 IMPLANT
DRSG MEPILEX SACRM 8.7X9.8 (GAUZE/BANDAGES/DRESSINGS) ×2 IMPLANT
ELECT CAUTERY BLADE 6.4 (BLADE) ×2 IMPLANT
ELECT REM PT RETURN 9FT ADLT (ELECTROSURGICAL) ×2
ELECTRODE REM PT RTRN 9FT ADLT (ELECTROSURGICAL) ×1 IMPLANT
FEMORAL COMP SZ3P RT SPHERE (Femur) ×1 IMPLANT
FEMUR BONE MODEL (MISCELLANEOUS) ×1 IMPLANT
GAUZE 4X4 16PLY ~~LOC~~+RFID DBL (SPONGE) ×2 IMPLANT
GAUZE XEROFORM 1X8 LF (GAUZE/BANDAGES/DRESSINGS) ×1 IMPLANT
GLOVE BIOGEL PI IND STRL 9 (GLOVE) ×1 IMPLANT
GLOVE BIOGEL PI INDICATOR 9 (GLOVE) ×1
GLOVE SURG ORTHO 8.0 STRL STRW (GLOVE) ×2 IMPLANT
GLOVE SURG SYN 9.0  PF PI (GLOVE) ×1
GLOVE SURG SYN 9.0 PF PI (GLOVE) ×1 IMPLANT
GLOVE SURG UNDER LTX SZ8 (GLOVE) ×2 IMPLANT
GOWN SRG 2XL LVL 4 RGLN SLV (GOWNS) ×1 IMPLANT
GOWN STRL NON-REIN 2XL LVL4 (GOWNS) ×1
GOWN STRL REUS W/ TWL LRG LVL3 (GOWN DISPOSABLE) ×1 IMPLANT
GOWN STRL REUS W/ TWL XL LVL3 (GOWN DISPOSABLE) ×1 IMPLANT
GOWN STRL REUS W/TWL LRG LVL3 (GOWN DISPOSABLE) ×1
GOWN STRL REUS W/TWL XL LVL3 (GOWN DISPOSABLE) ×1
HOLDER FOLEY CATH W/STRAP (MISCELLANEOUS) ×2 IMPLANT
HOOD PEEL AWAY FLYTE STAYCOOL (MISCELLANEOUS) ×4 IMPLANT
INSERT TIBIAL SZ 3 HT 10 R (Insert) ×1 IMPLANT
IV NS IRRIG 3000ML ARTHROMATIC (IV SOLUTION) ×2 IMPLANT
KIT PREVENA INCISION MGT20CM45 (CANNISTER) ×2 IMPLANT
KIT TURNOVER KIT A (KITS) ×2 IMPLANT
MANIFOLD NEPTUNE II (INSTRUMENTS) ×4 IMPLANT
NDL SAFETY ECLIPSE 18X1.5 (NEEDLE) ×1 IMPLANT
NDL SPNL 20GX3.5 QUINCKE YW (NEEDLE) ×1 IMPLANT
NEEDLE HYPO 18GX1.5 SHARP (NEEDLE) ×1
NEEDLE SPNL 20GX3.5 QUINCKE YW (NEEDLE) ×2 IMPLANT
NS IRRIG 1000ML POUR BTL (IV SOLUTION) ×2 IMPLANT
PACK TOTAL KNEE (MISCELLANEOUS) ×2 IMPLANT
PAD WRAPON POLAR KNEE (MISCELLANEOUS) ×1 IMPLANT
PATELLA RESURFACING MEDACTA 02 (Bone Implant) ×1 IMPLANT
PULSAVAC PLUS IRRIG FAN TIP (DISPOSABLE) ×2
SCALPEL PROTECTED #10 DISP (BLADE) ×4 IMPLANT
SOLUTION IRRIG SURGIPHOR (IV SOLUTION) ×2 IMPLANT
STAPLER SKIN PROX 35W (STAPLE) ×2 IMPLANT
STEM EXTENSION 11MMX30MM (Stem) ×1 IMPLANT
SUCTION FRAZIER HANDLE 10FR (MISCELLANEOUS) ×1
SUCTION TUBE FRAZIER 10FR DISP (MISCELLANEOUS) ×1 IMPLANT
SUT DVC 2 QUILL PDO  T11 36X36 (SUTURE) ×1
SUT DVC 2 QUILL PDO T11 36X36 (SUTURE) ×1 IMPLANT
SUT DVC VLOC 3-0 CL 6 P-12 (SUTURE) ×1 IMPLANT
SUT ETHIBOND 2 V 37 (SUTURE) IMPLANT
SUT V-LOC 90 ABS DVC 3-0 CL (SUTURE) ×1 IMPLANT
SYR 20ML LL LF (SYRINGE) ×2 IMPLANT
SYR 50ML LL SCALE MARK (SYRINGE) ×4 IMPLANT
TIP FAN IRRIG PULSAVAC PLUS (DISPOSABLE) ×1 IMPLANT
TOWEL OR 17X26 4PK STRL BLUE (TOWEL DISPOSABLE) ×1 IMPLANT
TOWER CARTRIDGE SMART MIX (DISPOSABLE) ×2 IMPLANT
TRAY FOLEY MTR SLVR 16FR STAT (SET/KITS/TRAYS/PACK) ×2 IMPLANT
TRAY TIB FX RT SZ 3 (Joint) ×1 IMPLANT
WATER STERILE IRR 1000ML POUR (IV SOLUTION) ×2 IMPLANT
WRAPON POLAR PAD KNEE (MISCELLANEOUS) ×2

## 2022-01-04 NOTE — Anesthesia Postprocedure Evaluation (Signed)
Anesthesia Post Note  Patient: Kendra Walton  Procedure(s) Performed: TOTAL KNEE ARTHROPLASTY (Right: Knee)  Patient location during evaluation: PACU Anesthesia Type: Combined General/Spinal Level of consciousness: awake and alert Pain management: pain level controlled Vital Signs Assessment: post-procedure vital signs reviewed and stable Respiratory status: spontaneous breathing, nonlabored ventilation and respiratory function stable Cardiovascular status: blood pressure returned to baseline and stable Postop Assessment: no apparent nausea or vomiting Anesthetic complications: no   No notable events documented.   Last Vitals:  Vitals:   01/04/22 1038 01/04/22 1238  BP: 130/89 114/84  Pulse: 69 73  Resp: 14 16  Temp: 36.4 C 36.7 C  SpO2: 98%     Last Pain:  Vitals:   01/04/22 1409  TempSrc:   PainSc: [place]

## 2022-01-04 NOTE — Transfer of Care (Signed)
Immediate Anesthesia Transfer of Care Note  Patient: Kendra Walton  Procedure(s) Performed: TOTAL KNEE ARTHROPLASTY (Right: Knee)  Patient Location: PACU  Anesthesia Type:General and Spinal  Level of Consciousness: drowsy  Airway & Oxygen Therapy: Patient Spontanous Breathing and Patient connected to face mask oxygen  Post-op Assessment: Report given to RN and Post -op Vital signs reviewed and stable  Post vital signs: Reviewed and stable  Last Vitals:  Vitals Value Taken Time  BP 114/57 01/04/22 0922  Temp    Pulse 89 01/04/22 0924  Resp 15 01/04/22 0924  SpO2 97 % 01/04/22 0924  Vitals shown include unvalidated device data.  Last Pain:  Vitals:   01/04/22 0636  TempSrc: Temporal  PainSc: 0-No pain         Complications: No notable events documented.

## 2022-01-04 NOTE — Anesthesia Procedure Notes (Signed)
Spinal  Patient location during procedure: OR Start time: 01/04/2022 7:23 AM End time: 01/04/2022 7:33 AM Reason for block: surgical anesthesia Staffing Performed: anesthesiologist  Anesthesiologist: Iran Ouch, MD Performed by: Lia Foyer, CRNA Authorized by: Iran Ouch, MD   Preanesthetic Checklist Completed: patient identified, IV checked, site marked, risks and benefits discussed, surgical consent, monitors and equipment checked, pre-op evaluation and timeout performed Spinal Block Patient position: sitting Prep: DuraPrep Patient monitoring: heart rate, cardiac monitor, continuous pulse ox and blood pressure Approach: midline Location: L3-4 Injection technique: single-shot Needle Needle type: Pencan  Needle gauge: 24 G Needle length: 9 cm Assessment Sensory level: T4 Events: CSF return

## 2022-01-04 NOTE — TOC Progression Note (Signed)
Transition of Care [facility]) - Progression Note    Patient Details  Name: Kendra Walton MRN: [id number] Date of Birth: 1950-07-15  Transition of Care [facility]) CM/SW [place], RN Phone Number: 01/04/2022, 10:59 AM  Clinical Narrative:    Accepted by [facility]        Expected Discharge Plan and Services                                                 Social Determinants of Health (SDOH) Interventions    Readmission Risk Interventions     No data to display

## 2022-01-04 NOTE — Op Note (Signed)
01/04/2022  9:24 AM  PATIENT:  Kendra Walton   MRN: [id number]  PRE-OPERATIVE DIAGNOSIS:  Primary localized osteoarthritis of right knee   POST-OPERATIVE DIAGNOSIS:  Same   PROCEDURE:  Procedure(s): Right TOTAL KNEE ARTHROPLASTY   SURGEON: Laurene Footman, MD   ASSISTANTS: Rachelle Hora, PA-C   ANESTHESIA:   spinal   EBL: 150   BLOOD ADMINISTERED:none   DRAINS:  Incisional wound VAC     LOCAL MEDICATIONS USED:  MARCAINE    and OTHER Toradol morphine and Exparel   SPECIMEN:  No Specimen   DISPOSITION OF SPECIMEN:  N/A   COUNTS:  YES   TOURNIQUET: 23 minutes at 300 mm Hg   IMPLANTS: Medacta  GMK sphere system with right 3+ femur, right 3 tibia with short stem and 10 mm insert.  Size 2 patella, all components cemented.   DICTATION: Viviann Spare Dictation   patient was brought to the operating room and spinal anesthesia was obtained.  After prepping and draping the right leg in sterile fashion, and after patient identification and timeout procedures were completed, a midline skin incision was made followed by medial parapatellar arthrotomy with severe medial compartment osteoarthritis, severe patellofemoral arthritis and moderate lateral compartment arthritis, partial synovectomy was also carried out.   The ACL and PCL and fat pad were excised along with anterior horns of the meniscus. The proximal tibia cutting guide from  the [facility] system was applied and the proximal tibia cut carried out.  The distal femoral cut was carried out in a similar fashion     The 3+ femoral cutting guide applied with anterior posterior and chamfer cuts made.  The posterior horns of the menisci were removed at this point.   Injection of the above medication was carried out after the femoral and tibial cuts were carried out.  The 3 baseplate trial was placed pinned into position and proximal tibial preparation carried out with drilling hand reaming and the keel punch followed by placement of the 3+  femur and sizing the tibial insert size 10 millimeter gave the best fit with stability and full extension.  The distal femoral drill holes were made in the notch cut for the trochlear groove was then carried out with trials were then removed the patella was cut using the patellar cutting guide and it sized to a size 2 after drill holes have been made tourniquet was raised at this point.  The knee was irrigated with pulsatile lavage and the bony surfaces dried the tibial component was cemented into place first.  Excess cement was removed and the polyethylene insert placed with a torque screw placed with a torque screwdriver tightened.  The distal femoral component was placed and the knee was held in extension as the patellar button was clamped into place.  After the cement was set, excess cement was removed and the knee was again irrigated thoroughly thoroughly irrigated.  The tourniquet was let down and hemostasis checked with electrocautery. The arthrotomy was repaired with a heavy Quill suture,  followed by 3-0 V lock subcuticular closure, skin staples followed by incisional wound VAC and Polar Care.Marland Kitchen   PLAN OF CARE: Admit for overnight observation   PATIENT DISPOSITION:  PACU - hemodynamically stable.

## 2022-01-04 NOTE — Anesthesia Preprocedure Evaluation (Addendum)
Anesthesia Evaluation  Patient identified by MRN, date of birth, ID band Patient awake    Reviewed: Allergy & Precautions, H&P , NPO status , Patient's Chart, lab work & pertinent test results, reviewed documented beta blocker date and time   History of Anesthesia Complications Negative for: history of anesthetic complications  Airway Mallampati: II  TM Distance: >3 FB Neck ROM: full    Dental  (+) Dental Advidsory Given, Edentulous Upper, Missing, Partial Lower   Pulmonary neg pulmonary ROS, former smoker,    Pulmonary exam normal breath sounds clear to auscultation       Cardiovascular Exercise Tolerance: Good hypertension, Pt. on medications (-) angina+ CAD and +CHF (no episodes since TAVR)  (-) Past MI and (-) Cardiac Stents (-) dysrhythmias + Valvular Problems/Murmurs (s/p TAVR in 2020)  Rhythm:regular Rate:Normal + Systolic murmurs 42/70/6237: EF >55%; mild LVH; mild LA dilation; trivial MR/TR/PR; prostatic AoV (MPG 12 mmHg); G1DD.  R/LHC 08/06/2018: EF 35%; 20% stenosis oRI; severe AS (MPG 48.2 mmHg; AVA (VTI) = 0.58 cm); mean PA = 36 mmHg, mean PCWP = 27 mmHg, PVR 1.78 WU, CO 5.06 L/min, CI 2.25 L/min/m   Neuro/Psych Peripheral nueropathy  Neuromuscular disease negative psych ROS   GI/Hepatic negative GI ROS, Neg liver ROS,   Endo/Other  diabetes, Type 2Hypothyroidism   Renal/GU CRFRenal disease  negative genitourinary   Musculoskeletal  (+) Arthritis ,   Abdominal   Peds  Hematology  (+) Blood dyscrasia, anemia ,   Anesthesia Other Findings Past Medical History: 09/15/2015: Absolute anemia     Comment:  Patient declines colonoscopy  04/05/2017: Aortic stenosis, moderate     Comment:  a.) s/p TAVR 08/18/2018; LEFT axillary approach No date: Arthritis 2010: Breast cancer, right ([place])     Comment:  a.) s/p RIGHT mastectomy; no chemotherpay or XRT 08/06/2018: CAD (coronary artery disease)     Comment:   a.) R/LHC 08/06/2018: EF 35%; 20% stenosis oRI; severe               AS (MPG 48.2 mmHg; AVA (VTI) = 0.58 cm); mean PA = 36               mmHg, mean PCWP = 27 mmHg, PVR 1.78 WU, CO 5.06 L/min, CI              2.25 L/min/m No date: CHF (congestive heart failure) ([place])     Comment:  a.)  TTE 07/29/2018: EF 35%; moderate global HK;               moderate LVH; mild LA dilation; trivial TR/PR, mild               AR/MR; moderate AS (MPG 48.2 mmHg); G2DD. b.) R/LHC               08/06/2018; EF 35%; mean PCWP 27 mmHg. c.)  TTE               10/20/2019: EF >55%; mild LVH; trivial MR/TR, mild PR,               prostatic AoV. d.)  TTE 08/17/2021: EF >55%; mild LVH;               mild LA dilation; trivial MR/TR/PR; prostatic AoV (MPG 12              mmHg); G1DD. No date: CKD (chronic kidney disease), stage III ([place]) No date: Ectopic pregnancy, tubal 08/18/2018: History of transcatheter aortic valve replacement (  TAVR)     Comment:  a.) 26 mm Medtronic Corevalve Evolute No date: HLD (hyperlipidemia) No date: Hypertension No date: Hypothyroidism 09/15/2015: Leukocytosis     Comment:  Hematology eval - likely benign; follow up planned  No date: Neuropathy 04/13/2016: Post-menopausal bleeding     Comment:  US showed thickened endometrium; biopsy recommended but               patient declined 02/06/2019: Right adrenal mass ([place])     Comment:  a.) CT CAP 08/14/2018: measured 4.3 x 3.3 cm. b.) CT abd              02/06/2019: measured 4.7 x 3.7 cm. c.) MRI lumbar spine               10/25/2021: interval increase to 7.1 x 7.1 cm. No date: T2DM (type 2 diabetes mellitus) ([place])  ECHO 3/23: NORMAL LEFT VENTRICULAR SYSTOLIC FUNCTION  WITH MILD LVH  NORMAL RIGHT VENTRICULAR SYSTOLIC FUNCTION  TRIVIAL REGURGITATION NOTED (See above)  NO VALVULAR STENOSIS  ESTIMATED LVEF >55%  Aortic: NORMAL GRADIENTS  AOV: BIOPROSTHETIC VALVE: MEDTRONIC COREVALVE EVOLUTE 107m  Mitral: TRACE MR  Tricuspid: TRIVIAL TR   Pulmonic: TRIVIAL PI  MILD LAE  Leaflets: BIOPROSTHETIC  Closest EF: >55% (Estimated)  LVH: MILD LVH  AVS: BIOPROSTHETIC AoV  Mitral: TRIVIAL MR  Tricuspid: TRIVIAL TR    Reproductive/Obstetrics negative OB ROS                           Anesthesia Physical  Anesthesia Plan  ASA: 3  Anesthesia Plan: General/Spinal   Post-op Pain Management: Regional block* and Ofirmev IV (intra-op)*   Induction: Intravenous  PONV Risk Score and Plan: 3 and Ondansetron, Dexamethasone, TIVA and Propofol infusion  Airway Management Planned: Natural Airway  Additional Equipment:   Intra-op Plan:   Post-operative Plan:   Informed Consent: I have reviewed the patients History and Physical, chart, labs and discussed the procedure including the risks, benefits and alternatives for the proposed anesthesia with the patient or authorized representative who has indicated his/her understanding and acceptance.     Dental Advisory Given  Plan Discussed with: Anesthesiologist, CRNA and Surgeon  Anesthesia Plan Comments:        Anesthesia Quick Evaluation

## 2022-01-04 NOTE — Evaluation (Signed)
Physical Therapy Evaluation Patient Details Name: Kendra Walton MRN: [id number] DOB: 16-Jul-1950 Today's Date: 01/04/2022  History of Present Illness  admitted for acute hospitalization s/p R TKA, WBAT (01/04/22)  Clinical Impression  Patient resting in bed upon arrival to room; husband at bedside.  Patient alert and oriented, agreeable to participation with session with min encouragement from therapist.  R knee pain rated 6/10 (FACES scale); meds requested and administered per RN during session.  R LE with fair/good post-op strength (3-/5) and ROM (0-86 degrees); fair/good ability to isolate/activate R quads with supine therex.   Able to complete bed mobility with close sup; sit/stand, basic transfers and bed/chair with RW, cga/min assist.  Does require extensive cuing for hand/foot placement and overall technique; slightly elevated surface for RLE pain control with transitional movements.  Additional gait distance deferred due to pain; will continue to assess/progress in subsequent sessions as appropriate. Would benefit from skilled PT to address above deficits and promote optimal return to PLOF.; Recommend transition to HHPT upon discharge from acute hospitalization.        Recommendations for follow up therapy are one component of a multi-disciplinary discharge planning process, led by the attending physician.  Recommendations may be updated based on patient status, additional functional criteria and insurance authorization.  Follow Up Recommendations Home health PT      Assistance Recommended at Discharge Intermittent Supervision/Assistance  Patient can return home with the following  A little help with walking and/or transfers;A little help with bathing/dressing/bathroom    Equipment Recommendations Rolling walker (2 wheels)  Recommendations for Other Services       Functional Status Assessment Patient has had a recent decline in their functional status and demonstrates the  ability to make significant improvements in function in a reasonable and predictable amount of time.     Precautions / Restrictions Precautions Precautions: Fall Restrictions Weight Bearing Restrictions: Yes RLE Weight Bearing: Weight bearing as tolerated      Mobility  Bed Mobility Overal bed mobility: Needs Assistance Bed Mobility: Supine to Sit     Supine to sit: Supervision     General bed mobility comments: indep mobilizes bilat LEs    Transfers Overall transfer level: Needs assistance Equipment used: Rolling walker (2 wheels) Transfers: Sit to/from Stand Sit to Stand: Min assist, Mod assist, From elevated surface           General transfer comment: cuing for hand placement and overall technique    Ambulation/Gait               General Gait Details: deferred due to pain in R LE  Stairs            Wheelchair Mobility    Modified Rankin (Stroke Patients Only)       Balance Overall balance assessment: Needs assistance Sitting-balance support: No upper extremity supported, Feet supported Sitting balance-Leahy Scale: Good     Standing balance support: Bilateral upper extremity supported Standing balance-Leahy Scale: Fair                               Pertinent Vitals/Pain Pain Assessment Pain Assessment: Faces Faces Pain Scale: Hurts even more Pain Location: R knee Pain Descriptors / Indicators: Aching, Grimacing, Guarding Pain Intervention(s): Limited activity within patient's tolerance, Monitored during session, Repositioned    Home Living Family/patient expects to be discharged to:: Private residence Living Arrangements: Spouse/significant other Available Help at Discharge: Family;Available PRN/intermittently Type of  Home: House Home Access: Stairs to enter Entrance Stairs-Rails: Right;Left (too wide) Entrance Stairs-Number of Steps: 5+1+1   Home Layout: One level Home Equipment: Rollator (4 wheels);BSC/3in1;Cane  - single point      Prior Function Prior Level of Function : Independent/Modified Independent             Mobility Comments: Mod indep with SPC for household and community mobilization; denies fall history.  Enjoys Careers information officer        Extremity/Trunk Assessment   Upper Extremity Assessment Upper Extremity Assessment: Overall WFL for tasks assessed    Lower Extremity Assessment Lower Extremity Assessment:  (R LE grossly 3-/5 throughout; L LE grossly WFL)       Communication   Communication: No difficulties  Cognition Arousal/Alertness: Awake/alert Behavior During Therapy: WFL for tasks assessed/performed Overall Cognitive Status: Within Functional Limits for tasks assessed                                          General Comments      Exercises Total Joint Exercises Goniometric ROM: R knee: 0-86 degrees Other Exercises Other Exercises: Supine R LE therex, 1x10, active ROM: ankle pumps, quad sets, SAQs, heel slides, hip abduct/adduct, SLR.  Fair/good R quad activation and isolated control.   Assessment/Plan    PT Assessment Patient needs continued PT services  PT Problem List Decreased strength;Decreased range of motion;Decreased activity tolerance;Decreased balance;Decreased mobility;Decreased knowledge of use of DME;Decreased safety awareness;Decreased knowledge of precautions;Cardiopulmonary status limiting activity;Pain       PT Treatment Interventions DME instruction;Gait training;Stair training;Functional mobility training;Therapeutic activities;Therapeutic exercise;Balance training;Patient/family education    PT Goals (Current goals can be found in the Care Plan section)  Acute Rehab PT Goals Patient Stated Goal: to go home PT Goal Formulation: With patient/family Time For Goal Achievement: 01/18/22 Potential to Achieve Goals: Good    Frequency BID     Co-evaluation               AM-PAC PT "6 Clicks"  Mobility  Outcome Measure Help needed turning from your back to your side while in a flat bed without using bedrails?: None Help needed moving from lying on your back to sitting on the side of a flat bed without using bedrails?: A Little Help needed moving to and from a bed to a chair (including a wheelchair)?: A Little Help needed standing up from a chair using your arms (e.g., wheelchair or bedside chair)?: A Little Help needed to walk in hospital room?: A Lot Help needed climbing 3-5 steps with a railing? : A Lot 6 Click Score: 17    End of Session Equipment Utilized During Treatment: Gait belt Activity Tolerance: Patient tolerated treatment well;Patient limited by pain Patient left: in chair;with call bell/phone within reach;with chair alarm set Nurse Communication: Mobility status PT Visit Diagnosis: Muscle weakness (generalized) (M62.81);Difficulty in walking, not elsewhere classified (R26.2);Other abnormalities of gait and mobility (R26.89);Pain Pain - Right/Left: Right Pain - part of body: Knee    Time: 1342-1425 PT Time Calculation (min) (ACUTE ONLY): 43 min   Charges:   PT Evaluation $PT Eval Moderate Complexity: 1 Mod PT Treatments $Therapeutic Exercise: 8-22 mins       Dennies Coate H. Owens Shark, PT, DPT, NCS 01/04/22, 2:54 PM [phone]

## 2022-01-04 NOTE — H&P (Signed)
Chief Complaint  Patient presents with   Right Knee - Follow-up, Pain    History of the Present Illness: Kendra Walton is a 71 y.o. female here today.   The patient presents for history and physical for right total knee arthroplasty on 01/04/2022. She was previously scheduled for a right total knee arthroplasty, and then had an attack of sciatica and had a large herniated disc. Subsequently, she had back surgery with resolution of symptoms. She now comes in for H and P for her right total knee arthroplasty.  The patient states her back is doing well, but she has normal back pain. She states she could not sit still after her right total knee arthroplasty. She states she takes methocarbamol 1 time a day. The patient states she had blood work done yesterday, and it looked like her hemoglobin was very low. Prior x-rays show complete loss of medial joint space, particularly on the PA flexion view. There is also significant patellofemoral degenerative change with large osteophytes on the femoral trochlea, and small osteophytes in the back of the tibia and femur. She uses a cane only when she is out of the house, but not around the house. She states she has had injections for her right knee in the past until they stopped working. Her primary care doctor is Halina Maidens, MD. Her aorta was replaced at [facility] due to stenosis.  The patient states her diabetes is under control. Her last A1c was 6. She has never had a history of blood clots. She has upper dentures.  I have reviewed past medical, surgical, social and family history, and allergies as documented in the EMR.  Past Medical History: Past Medical History:  Diagnosis Date   Bradycardia 08/21/2018   Breast cancer (CMS-HCC) 2010  s/p right mastectomy (no chemo or radiation)   CHF (congestive heart failure), NYHA class III, acute on chronic, systolic (CMS-HCC) 9/62/9528   Diabetes mellitus type 2, uncomplicated (CMS-HCC)   Hyperlipidemia    Hypertension   Neuropathy   Nonrheumatic aortic valve stenosis 08/14/2018   Osteoarthritis   Thyroid disease   Past Surgical History: Past Surgical History:  Procedure Laterality Date   MASTECTOMY PARTIAL Right 2010   TRANSCATHETER AORTIC VALVE REPLACEMENT N/A 08/18/2018  Procedure: TRANSCATHETER AORTIC VALVE REPLACEMENT (TAVR / TAVI) WITH PROSTHETIC VALVE; OPEN left AXILLARY ARTERY APPROACH, CoreValve, Evolut R, commercial; Surgeon: Laretta Alstrom, MD; Location: [facility]PERATING RO[facility]; Service: Cardiothoracic; Laterality: N/A;   TRANSCATHETER AORTIC VALVE REPLACEMENT N/A 08/18/2018  Procedure: TRANSCATHETER AORTIC VALVE REPLACEMENT (TAVR / TAVI) WITH PROSTHETIC VALVE; OPEN left AXILLARY ARTERY APPROACH, CoreValve, Evolut R, commercial; Surgeon: Joline Maxcy, MD; Location: [facility]; Service: General Surgery; Laterality: N/A;   Right L5-S1 microdiscectomy 11/08/2021  Dr [facility]ade Maw [facility]   c section   CHOLECYSTECTOMY   Past Family History: Family History  Problem Relation Age of Onset   Emphysema Mother   Alcohol abuse Father   Alcohol abuse Brother   Medications: Current Outpatient Medications Ordered in Epic  Medication Sig Dispense Refill   aspirin 81 MG EC tablet Take 81 mg by mouth once daily   atorvastatin (LIPITOR) 10 MG tablet Take 10 mg by mouth daily with dinner   blood glucose diagnostic test strip Use 1 strip 3 (three) times daily Use as instructed.   carboxymethylcellulose sodium 0.25 % Apply 1 drop to eye 2 (two) times daily   folic acid/multivit-min/lutein (CENTRUM SILVER ORAL) Take 1 tablet by mouth once daily   hydrocortisone 2.5 %  cream Apply topically   ibuprofen (MOTRIN) 200 MG tablet Take by mouth once daily as needed   ketoconazole (NIZORAL) 2 % cream Apply topically   levothyroxine (SYNTHROID) 150 MCG tablet Take 1 tablet (150 mcg total) by mouth every morning before breakfast (0630) And 1/2 tablet on Sundays. 90 tablet 0    lisinopriL-hydrochlorothiazide (ZESTORETIC) 10-12.5 mg tablet Take 1 tablet by mouth once daily   metFORMIN (GLUCOPHAGE) 500 MG tablet Take 1 tablet (500 mg total) by mouth 2 (two) times daily with meals If blood sugar trending above 180, please increase to full dose 1,'000mg'$  twice a day. 0   methocarbamoL (ROBAXIN) 750 MG tablet Take 1 tablet (750 mg total) by mouth 4 (four) times daily 60 tablet 1   pregabalin (LYRICA) 50 MG capsule Take 2 capsules (100 mg total) by mouth 3 (three) times daily 90 capsule 0   SENNA 8.6 mg tablet   traMADoL (ULTRAM) 50 mg tablet Take 100 mg by mouth every 8 (eight) hours as needed   No current Epic-ordered facility-administered medications on file.   Allergies: Allergies  Allergen Reactions   Shrimp Hives and Itching    Body mass index is 36.04 kg/m.  Review of Systems: A comprehensive 14 point ROS was performed, reviewed, and the pertinent orthopaedic findings are documented in the HPI.  Vitals:  12/27/21 1341  BP: 128/70    General Physical Examination:   General/Constitutional: No apparent distress: well-nourished and well developed. Eyes: Pupils equal, round with synchronous movement. Lungs: Clear to auscultation HEENT: Remarkable for operative dentures. Vascular: No edema, swelling or tenderness, except as noted in detailed exam. Cardiac: Heart rate and rhythm is regular. Integumentary: No impressive skin lesions present, except as noted in detailed exam. Neuro/Psych: Normal mood and affect, oriented to person, place and time.  On exam, right knee range of motion of 5-115 degrees. Marked crepitation of the patellofemoral joint. Right hip has 10 degrees internal rotation and 60 degrees external. Antalgic gait.  Radiographs: No new imaging studies were obtained today.  Assessment: ICD-10-CM  1. Primary osteoarthritis of right knee M17.11  2. Severe obesity (BMI 35.0-39.9) with comorbidity (CMS-HCC) E66.01   Plan: The patient has  clinical findings of severe osteoarthritis of the right medial compartment and patellofemoral.  We discussed the patient's prior x-ray findings. I recommend right total knee arthroplasty. We had an extensive discussion today. I explained the surgery and postoperative course in detail.  We will schedule the patient for right total knee arthroplasty in the near future.  Surgical Risks: The nature of the condition and the proposed procedure has been reviewed in detail with the patient. Surgical versus non-surgical options and prognosis for recovery have been reviewed and the inherent risks and benefits of each have been discussed including the risks of infection, bleeding, injury to nerves/blood vessels/tendons, incomplete relief of symptoms, persisting pain and/or stiffness, loss of function, complex regional pain syndrome, failure of the procedure, as appropriate.  Teeth:upper dentures  Document Attestation: IMaylon Cos, have reviewed and updated documentation for [facility], MD, utilizing [employer] D[employer].   Electronically signed by Maylon Cos at 12/28/2021 3:20 PM EDT  Reviewed  H+P. No changes noted.

## 2022-01-05 ENCOUNTER — Encounter: Payer: Self-pay | Admitting: Orthopedic Surgery

## 2022-01-05 DIAGNOSIS — I5023 Acute on chronic systolic (congestive) heart failure: Secondary | ICD-10-CM | POA: Diagnosis not present

## 2022-01-05 DIAGNOSIS — E039 Hypothyroidism, unspecified: Secondary | ICD-10-CM | POA: Diagnosis not present

## 2022-01-05 DIAGNOSIS — Z853 Personal history of malignant neoplasm of breast: Secondary | ICD-10-CM | POA: Diagnosis not present

## 2022-01-05 DIAGNOSIS — Z7982 Long term (current) use of aspirin: Secondary | ICD-10-CM | POA: Diagnosis not present

## 2022-01-05 DIAGNOSIS — N1831 Chronic kidney disease, stage 3a: Secondary | ICD-10-CM | POA: Diagnosis not present

## 2022-01-05 DIAGNOSIS — Z7984 Long term (current) use of oral hypoglycemic drugs: Secondary | ICD-10-CM | POA: Diagnosis not present

## 2022-01-05 DIAGNOSIS — Z79899 Other long term (current) drug therapy: Secondary | ICD-10-CM | POA: Diagnosis not present

## 2022-01-05 DIAGNOSIS — I2510 Atherosclerotic heart disease of native coronary artery without angina pectoris: Secondary | ICD-10-CM | POA: Diagnosis not present

## 2022-01-05 DIAGNOSIS — E1122 Type 2 diabetes mellitus with diabetic chronic kidney disease: Secondary | ICD-10-CM | POA: Diagnosis not present

## 2022-01-05 DIAGNOSIS — M1711 Unilateral primary osteoarthritis, right knee: Secondary | ICD-10-CM | POA: Diagnosis not present

## 2022-01-05 DIAGNOSIS — I130 Hypertensive heart and chronic kidney disease with heart failure and stage 1 through stage 4 chronic kidney disease, or unspecified chronic kidney disease: Secondary | ICD-10-CM | POA: Diagnosis not present

## 2022-01-05 LAB — GLUCOSE, CAPILLARY
Glucose-Capillary: 149 mg/dL — ABNORMAL HIGH (ref 70–99)
Glucose-Capillary: 137 mg/dL — ABNORMAL HIGH (ref 70–99)

## 2022-01-05 LAB — BASIC METABOLIC PANEL
Anion gap: 8 (ref 5–15)
BUN: 20 mg/dL (ref 8–23)
CO2: 26 mmol/L (ref 22–32)
Calcium: 9 mg/dL (ref 8.9–10.3)
Chloride: 105 mmol/L (ref 98–111)
Creatinine, Ser: 1.05 mg/dL — ABNORMAL HIGH (ref 0.44–1.00)
GFR, Estimated: 56 mL/min — ABNORMAL LOW (ref >60)
Glucose, Bld: 151 mg/dL — ABNORMAL HIGH (ref 70–99)
Potassium: 4.9 mmol/L (ref 3.5–5.1)
Sodium: 139 mmol/L (ref 135–145)

## 2022-01-05 LAB — CBC
HCT: 30.5 % — ABNORMAL LOW (ref 36.0–46.0)
Hemoglobin: 9.5 g/dL — ABNORMAL LOW (ref 12.0–15.0)
MCH: 26.4 pg (ref 26.0–34.0)
MCHC: 31.1 g/dL (ref 30.0–36.0)
MCV: 84.7 fL (ref 80.0–100.0)
Platelets: 305 10*3/uL (ref 150–400)
RBC: 3.6 MIL/uL — ABNORMAL LOW (ref 3.87–5.11)
RDW: 13.8 % (ref 11.5–15.5)
WBC: 11.4 10*3/uL — ABNORMAL HIGH (ref 4.0–10.5)
nRBC: 0 % (ref 0.0–0.2)

## 2022-01-05 MED ORDER — DOCUSATE SODIUM 100 MG PO CAPS: 100.0000 mg | ORAL_CAPSULE | Freq: Two times a day (BID) | ORAL | 0 refills | Status: DC

## 2022-01-05 MED ORDER — TRAMADOL HCL 50 MG PO TABS: 50.0000 mg | ORAL_TABLET | Freq: Four times a day (QID) | ORAL | 0 refills | Status: DC | PRN

## 2022-01-05 MED ORDER — ENOXAPARIN SODIUM 40 MG/0.4ML IJ SOSY: 40.0000 mg | PREFILLED_SYRINGE | INTRAMUSCULAR | 0 refills | Status: DC

## 2022-01-05 MED ORDER — OXYCODONE HCL 5 MG PO TABS: 5.0000 mg | ORAL_TABLET | ORAL | 0 refills | Status: DC | PRN

## 2022-01-05 NOTE — Progress Notes (Signed)
Physical Therapy Treatment Patient Details Name: Kendra Walton MRN: [id number] DOB: Apr 19, 1951 Today's Date: 01/05/2022   History of Present Illness admitted for acute hospitalization s/p R TKA, WBAT (01/04/22)    PT Comments    Pt received seated in recliner upon arrival to room and pt agreeable to therapy.  Pt noting significant amount of pain still, but able notes that she felt better when upright in standing.  Pt notes she is determined to ambulate today.  Pt performed well with transfers and was able to ambulate ~100 ft towards the gym to perform stair training.  Pt required seated rest break as she was SOB once making it to the gym.  Pt did well on steps, and then attempted to ambulate back to the room.  Pt ambulated another 40 ft before becoming too fatigued to continue.  Pt wheeled in recliner back to room with all needs met, ice applied, and call bell within reach.  Pt noted to be able to ambulate household distances necessary for d/c to home.  Pt will benefit from having HHPT for ongoing rehabilitation process to address remaining deficits.     Recommendations for follow up therapy are one component of a multi-disciplinary discharge planning process, led by the attending physician.  Recommendations may be updated based on patient status, additional functional criteria and insurance authorization.  Follow Up Recommendations  Home health PT     Assistance Recommended at Discharge Intermittent Supervision/Assistance  Patient can return home with the following A little help with walking and/or transfers;A little help with bathing/dressing/bathroom   Equipment Recommendations  Rolling walker (2 wheels)    Recommendations for Other Services       Precautions / Restrictions Precautions Precautions: Fall Restrictions Weight Bearing Restrictions: Yes RLE Weight Bearing: Weight bearing as tolerated     Mobility  Bed Mobility Overal bed mobility: Needs Assistance Bed  Mobility: Supine to Sit     Supine to sit: Supervision, HOB elevated          Transfers Overall transfer level: Needs assistance Equipment used: Rolling walker (2 wheels) Transfers: Sit to/from Stand Sit to Stand: Min assist, Mod assist           General transfer comment: step by step vcs for technique    Ambulation/Gait Ambulation/Gait assistance: Min guard Gait Distance (Feet): 100 Feet Assistive device: Rolling walker (2 wheels) Gait Pattern/deviations: Step-through pattern, Decreased step length - left, Decreased stance time - right Gait velocity: decreased   Pre-gait activities: weight-shifts and marching in place. General Gait Details: slow, requires verbal cues to stay close to the walker at all times.   Stairs             Wheelchair Mobility    Modified Rankin (Stroke Patients Only)       Balance Overall balance assessment: Needs assistance Sitting-balance support: No upper extremity supported, Feet supported Sitting balance-Leahy Scale: Good     Standing balance support: Bilateral upper extremity supported, During functional activity Standing balance-Leahy Scale: Fair                              Cognition Arousal/Alertness: Awake/alert Behavior During Therapy: WFL for tasks assessed/performed Overall Cognitive Status: Within Functional Limits for tasks assessed  Exercises      General Comments        Pertinent Vitals/Pain Pain Assessment Pain Assessment: 0-10 Pain Score: 4  Pain Location: R knee Pain Descriptors / Indicators: Aching, Grimacing, Guarding Pain Intervention(s): Limited activity within patient's tolerance, Monitored during session, Premedicated before session, RN gave pain meds during session, Ice applied    Home Living                          Prior Function            PT Goals (current goals can now be found in the care plan  section) Acute Rehab PT Goals Patient Stated Goal: to go home PT Goal Formulation: With patient/family Time For Goal Achievement: 01/18/22 Potential to Achieve Goals: Good Progress towards PT goals: Progressing toward goals    Frequency    BID      PT Plan      Co-evaluation              AM-PAC PT "6 Clicks" Mobility   Outcome Measure  Help needed turning from your back to your side while in a flat bed without using bedrails?: None Help needed moving from lying on your back to sitting on the side of a flat bed without using bedrails?: A Little Help needed moving to and from a bed to a chair (including a wheelchair)?: A Little Help needed standing up from a chair using your arms (e.g., wheelchair or bedside chair)?: A Little Help needed to walk in hospital room?: A Lot Help needed climbing 3-5 steps with a railing? : A Lot 6 Click Score: 17    End of Session Equipment Utilized During Treatment: Gait belt Activity Tolerance: Patient tolerated treatment well;Patient limited by pain Patient left: in chair;with call bell/phone within reach;with chair alarm set Nurse Communication: Mobility status PT Visit Diagnosis: Muscle weakness (generalized) (M62.81);Difficulty in walking, not elsewhere classified (R26.2);Other abnormalities of gait and mobility (R26.89);Pain Pain - Right/Left: Right Pain - part of body: Knee     Time: 6578-4696 PT Time Calculation (min) (ACUTE ONLY): 72 min  Charges:  $Gait Training: 23-37 mins $Therapeutic Exercise: 8-22 mins $Therapeutic Activity: 8-22 mins                     Gwenlyn Saran, PT, DPT 01/05/22, 2:02 PM

## 2022-01-05 NOTE — Discharge Summary (Signed)
Physician Discharge Summary  Patient ID: Kendra Walton MRN: [id number] DOB/AGE: 1950/12/20 71 y.o.  Admit date: 01/04/2022 Discharge date: 01/05/2022  Admission Diagnoses:  S/P TKR (total knee replacement) using cement, right [Z96.651]   Discharge Diagnoses: Patient Active Problem List   Diagnosis Date Noted   S/P TKR (total knee replacement) using cement, right 01/04/2022   Type 2 diabetes mellitus with stage 3a chronic kidney disease, without long-term current use of insulin ([place]) 06/20/2021   Aortic atherosclerosis ([place]) 01/03/2020   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   Obesity (BMI 30-39.9) 07/09/2018   Hearing decreased 12/13/2015   Hyperlipidemia associated with type 2 diabetes mellitus ([place]) 09/14/2015   History of breast cancer in female 04/07/2015   Acquired hypothyroidism 10/04/2014   Diabetic peripheral neuropathy ([place]) 10/04/2014   Essential (primary) hypertension 10/04/2014   Arthritis of knee, degenerative 10/04/2014   DM (diabetes mellitus), type 2 with neurological complications ([place]) 31/51/7616    Past Medical History:  Diagnosis Date   Absolute anemia 09/15/2015   Patient declines colonoscopy    Aortic stenosis, moderate 04/05/2017   a.) s/p TAVR 08/18/2018; LEFT axillary approach   Arthritis    Breast cancer, right ([place]) 2010   a.) s/p RIGHT mastectomy; no chemotherpay or XRT   CAD (coronary artery disease) 08/06/2018   a.) R/LHC 08/06/2018: EF 35%; 20% stenosis oRI; severe AS (MPG 48.2 mmHg; AVA (VTI) = 0.58 cm); mean PA = 36 mmHg, mean PCWP = 27 mmHg, PVR 1.78 WU, CO 5.06 L/min, CI 2.25 L/min/m   CHF (congestive heart failure) ([place])    a.)  TTE 07/29/2018: EF 35%; moderate global HK; moderate LVH; mild LA dilation; trivial TR/PR, mild AR/MR; moderate AS (MPG 48.2 mmHg); G2DD. b.) R/LHC 08/06/2018; EF 35%; mean PCWP 27 mmHg. c.)  TTE 10/20/2019: EF >55%; mild LVH; trivial MR/TR, mild PR, prostatic AoV. d.)  TTE  08/17/2021: EF >55%; mild LVH; mild LA dilation; trivial MR/TR/PR; prostatic AoV (MPG 12 mmHg); G1DD.   CKD (chronic kidney disease), stage III (H[facility])    Ectopic pregnancy, tubal    History of transcatheter aortic valve replacement (TAVR) 08/18/2018   a.) 26 mm Medtronic Corevalve Evolute   HLD (hyperlipidemia)    Hypertension    Hypothyroidism    Leukocytosis 09/15/2015   Hematology eval - likely benign; follow up planned    Neuropathy    Post-menopausal bleeding 04/13/2016   US showed thickened endometrium; biopsy recommended but patient declined   Right adrenal mass ([place]) 02/06/2019   a.) CT CAP 08/14/2018: measured 4.3 x 3.3 cm. b.) CT abd 02/06/2019: measured 4.7 x 3.7 cm. c.) MRI lumbar spine 10/25/2021: interval increase to 7.1 x 7.1 cm.   T2DM (type 2 diabetes mellitus) ([place])      Transfusion: none   Consultants (if any):   Discharged Condition: Improved  Hospital Course: Kendra Walton is an 71 y.o. female who was admitted 01/04/2022 with a diagnosis of S/P TKR (total knee replacement) using cement, right and went to the operating room on 01/04/2022 and underwent the above named procedures.    Surgeries: Procedure(s): TOTAL KNEE ARTHROPLASTY on 01/04/2022 Patient tolerated the surgery well. Taken to PACU where she was stabilized and then transferred to the orthopedic floor.  Started on Lovenox 30 mg q 12 hrs. Foot pumps applied bilaterally at 80 mm. Heels elevated on bed with rolled towels. No evidence of DVT. Negative [place]. Physical therapy started on day #1 for gait training and transfer. OT started day #  1 for ADL and assisted devices.  Patient's foley was d/c on day #1. Patient's IV was d/c on day #2.  On post op day #3 patient was stable and ready for discharge to home with HHPT.    She was given perioperative antibiotics:  Anti-infectives (From admission, onward)    Start     Dose/Rate Route Frequency Ordered Stop   01/04/22 1200  ceFAZolin (ANCEF) IVPB  2g/100 mL premix        2 g 200 mL/hr over 30 Minutes Intravenous Every 6 hours 01/04/22 1042 01/04/22 1806   01/04/22 0615  ceFAZolin (ANCEF) IVPB 2g/100 mL premix        2 g 200 mL/hr over 30 Minutes Intravenous On call to O.R. 01/04/22 7408 01/04/22 0740   01/04/22 0611  ceFAZolin (ANCEF) 2-4 GM/100ML-% IVPB       Note to Pharmacy: [place] Nail E: cabinet override      01/04/22 0611 01/04/22 0751     .  She was given sequential compression devices, early ambulation, and Lovenox TEDs for DVT prophylaxis.  She benefited maximally from the hospital stay and there were no complications.    Recent vital signs:  Vitals:   01/05/22 0924 01/05/22 1048  BP: (!) 95/58 (!) 150/56  Pulse: 99 91  Resp: 18 17  Temp: 98.2 F (36.8 C)   SpO2: 98%     Recent laboratory studies:  Lab Results  Component Value Date   HGB 9.5 (L) 01/05/2022   HGB 9.0 (L) 01/04/2022   HGB 10.0 (L) 12/26/2021   Lab Results  Component Value Date   WBC 11.4 (H) 01/05/2022   PLT 305 01/05/2022   No results found for: "INR" Lab Results  Component Value Date   NA 139 01/05/2022   K 4.9 01/05/2022   CL 105 01/05/2022   CO2 26 01/05/2022   BUN 20 01/05/2022   CREATININE 1.05 (H) 01/05/2022   GLUCOSE 151 (H) 01/05/2022    Discharge Medications:   Allergies as of 01/05/2022       Reactions   Shellfish Allergy Hives, Itching        Medication List     TAKE these medications    atorvastatin 20 MG tablet Commonly known as: LIPITOR Take 1 tablet (20 mg total) by mouth at bedtime.   docusate sodium 100 MG capsule Commonly known as: COLACE Take 1 capsule (100 mg total) by mouth 2 (two) times daily.   enoxaparin 40 MG/0.4ML injection Commonly known as: LOVENOX Inject 0.4 mLs (40 mg total) into the skin daily for 14 days.   glucose blood test strip Commonly known as: ONE TOUCH ULTRA TEST Use to test BS twice a day   levothyroxine 150 MCG tablet Commonly known as: SYNTHROID Take  75-150 mcg by mouth See admin instructions. Take 150 mcg daily except Sundays take 75 mcg   lisinopril-hydrochlorothiazide 10-12.5 MG tablet Commonly known as: ZESTORETIC TAKE 1 TABLET BY MOUTH DAILY   metFORMIN 500 MG 24 hr tablet Commonly known as: GLUCOPHAGE-XR TAKE 2 TABLETS(1000 MG) BY MOUTH TWICE DAILY   methocarbamol 750 MG tablet Commonly known as: ROBAXIN Take 750 mg by mouth daily.   OneTouch Delica Plus [id number] Misc USE 1 EACH TWICE DAILY   oxyCODONE 5 MG immediate release tablet Commonly known as: Oxy IR/ROXICODONE Take 1-2 tablets (5-10 mg total) by mouth every 4 (four) hours as needed for moderate pain (pain score 4-6).   pregabalin 50 MG capsule Commonly known as: LYRICA Take  1 capsule (50 mg total) by mouth 4 (four) times daily. What changed:  how much to take additional instructions   REFRESH OP Place 1 drop into both eyes daily.   senna 8.6 MG Tabs tablet Commonly known as: SENOKOT Take 1 tablet (8.6 mg total) by mouth daily as needed for mild constipation.   traMADol 50 MG tablet Commonly known as: ULTRAM Take 1 tablet (50 mg total) by mouth every 6 (six) hours as needed. What changed:  how much to take when to take this reasons to take this               Durable Medical Equipment  (From admission, onward)           Start     Ordered   01/04/22 1043  DME Walker rolling  Once       Question Answer Comment  Walker: With 5 Inch Wheels   Patient needs a walker to treat with the following condition S/P TKR (total knee replacement) using cement, right      01/04/22 1042   01/04/22 1043  DME 3 n 1  Once        01/04/22 1042   01/04/22 1043  DME Bedside commode  Once       Question:  Patient needs a bedside commode to treat with the following condition  Answer:  S/P TKR (total knee replacement) using cement, right   01/04/22 1042            Diagnostic Studies: DG Knee 1-2 Views Right  Result Date: 01/04/2022 CLINICAL DATA:   Status post total right knee arthroplasty. EXAM: RIGHT KNEE - 1-2 VIEW COMPARISON:  CT right knee 06/28/2021 FINDINGS: Status post total right knee arthroplasty. No perihardware lucency is seen to indicate hardware failure or loosening. Expected postoperative changes including intra-articular air and a small joint effusion. Anterior surgical skin staples. No acute fracture or dislocation. IMPRESSION: Status post total right knee arthroplasty without evidence of hardware failure. Electronically Signed   By: Yvonne Kendall M.D.   On: 01/04/2022 09:57    Disposition:        Signed: Dorise Hiss CHRISTOPHER 01/05/2022, 12:44 PM

## 2022-01-05 NOTE — Plan of Care (Signed)
  Problem: Education: Goal: Ability to describe self-care measures that may prevent or decrease complications (Diabetes Survival Skills Education) will improve Outcome: Adequate for Discharge Goal: Individualized Educational Video(s) Outcome: Adequate for Discharge   Problem: Coping: Goal: Ability to adjust to condition or change in health will improve Outcome: Adequate for Discharge   Problem: Fluid Volume: Goal: Ability to maintain a balanced intake and output will improve Outcome: Adequate for Discharge   Problem: Health Behavior/Discharge Planning: Goal: Ability to identify and utilize available resources and services will improve Outcome: Adequate for Discharge Goal: Ability to manage health-related needs will improve Outcome: Adequate for Discharge   Problem: Metabolic: Goal: Ability to maintain appropriate glucose levels will improve Outcome: Adequate for Discharge   Problem: Nutritional: Goal: Maintenance of adequate nutrition will improve Outcome: Adequate for Discharge Goal: Progress toward achieving an optimal weight will improve Outcome: Adequate for Discharge   Problem: Skin Integrity: Goal: Risk for impaired skin integrity will decrease Outcome: Adequate for Discharge   Problem: Tissue Perfusion: Goal: Adequacy of tissue perfusion will improve Outcome: Adequate for Discharge   Problem: Education: Goal: Knowledge of General Education information will improve Description: Including pain rating scale, medication(s)/side effects and non-pharmacologic comfort measures Outcome: Adequate for Discharge   Problem: Health Behavior/Discharge Planning: Goal: Ability to manage health-related needs will improve Outcome: Adequate for Discharge   Problem: Clinical Measurements: Goal: Ability to maintain clinical measurements within normal limits will improve Outcome: Adequate for Discharge Goal: Will remain free from infection Outcome: Adequate for Discharge Goal:  Diagnostic test results will improve Outcome: Adequate for Discharge Goal: Respiratory complications will improve Outcome: Adequate for Discharge Goal: Cardiovascular complication will be avoided Outcome: Adequate for Discharge   Problem: Activity: Goal: Risk for activity intolerance will decrease Outcome: Adequate for Discharge   Problem: Nutrition: Goal: Adequate nutrition will be maintained Outcome: Adequate for Discharge   Problem: Coping: Goal: Level of anxiety will decrease Outcome: Adequate for Discharge   Problem: Elimination: Goal: Will not experience complications related to bowel motility Outcome: Adequate for Discharge Goal: Will not experience complications related to urinary retention Outcome: Adequate for Discharge   Problem: Pain Managment: Goal: General experience of comfort will improve Outcome: Adequate for Discharge   Problem: Safety: Goal: Ability to remain free from injury will improve Outcome: Adequate for Discharge   Problem: Skin Integrity: Goal: Risk for impaired skin integrity will decrease Outcome: Adequate for Discharge   Problem: Education: Goal: Knowledge of the prescribed therapeutic regimen will improve Outcome: Adequate for Discharge Goal: Individualized Educational Video(s) Outcome: Adequate for Discharge   Problem: Activity: Goal: Ability to avoid complications of mobility impairment will improve Outcome: Adequate for Discharge Goal: Range of joint motion will improve Outcome: Adequate for Discharge   Problem: Clinical Measurements: Goal: Postoperative complications will be avoided or minimized Outcome: Adequate for Discharge   Problem: Pain Management: Goal: Pain level will decrease with appropriate interventions Outcome: Adequate for Discharge   Problem: Skin Integrity: Goal: Will show signs of wound healing Outcome: Adequate for Discharge

## 2022-01-05 NOTE — Discharge Instructions (Signed)

## 2022-01-05 NOTE — Progress Notes (Signed)
Spoke with the patient  She has a Corporate investment banker and a 3 in 1 She will need a Rolling walker, to be delivered to the bedside by [employer] She is set up with [facility] for [facility] Her husband provides transportation She can afford her medication

## 2022-01-05 NOTE — Progress Notes (Signed)
Patient discharging home, husband to transport patient home. Instructions given to patient, verbalized understanding. Waiting on walker to be delivered.

## 2022-01-05 NOTE — Evaluation (Signed)
Occupational Therapy Evaluation Patient Details Name: Kendra Walton MRN: [id number] DOB: 1951-01-12 Today's Date: 01/05/2022   History of Present Illness admitted for acute hospitalization s/p R TKA, WBAT (01/04/22)   Clinical Impression   Pt seen for OT evaluation this date, POD#1 from above surgery. Pt is alert and oriented x4, agreeable to evaluation. PTA pt was MOD I-indepin all ADL/IADL, amb with SPC home and community distances. Pt provided education re: polar care mgt, falls prevention strategies, home/routines modifications, DME/AE for LB bathing and dressing tasks, and compression stocking mgt. MIN-MOD A required for STS with step by step vcs required for body mechanics with RW. Pt amb to bathroom with RW with supervision-CGA. Pain is a limiting factor in STS complexion on this date. Pt presents with deficits in strength, endurance, activity tolerance all affecting safe and optimal ADL completion. Pt would benefit from skilled OT services including additional instruction in dressing techniques with or without assistive devices for dressing and bathing skills to support recall and carryover prior to discharge and ultimately to maximize safety, independence, and minimize falls risk and caregiver burden. Recommend HHOT following dischargers.      Recommendations for follow up therapy are one component of a multi-disciplinary discharge planning process, led by the attending physician.  Recommendations may be updated based on patient status, additional functional criteria and insurance authorization.   Follow Up Recommendations  Home health OT    Assistance Recommended at Discharge Intermittent Supervision/Assistance  Patient can return home with the following A little help with walking and/or transfers;A little help with bathing/dressing/bathroom    Functional Status Assessment  Patient has had a recent decline in their functional status and demonstrates the ability to make  significant improvements in function in a reasonable and predictable amount of time.  Equipment Recommendations  BSC/3in1    Recommendations for Other Services       Precautions / Restrictions Precautions Precautions: Fall Restrictions Weight Bearing Restrictions: Yes RLE Weight Bearing: Weight bearing as tolerated      Mobility Bed Mobility Overal bed mobility: Needs Assistance Bed Mobility: Supine to Sit     Supine to sit: Supervision, HOB elevated          Transfers Overall transfer level: Needs assistance Equipment used: Rolling walker (2 wheels) Transfers: Sit to/from Stand Sit to Stand: Min assist, Mod assist           General transfer comment: step by step vcs for technique      Balance Overall balance assessment: Needs assistance Sitting-balance support: No upper extremity supported, Feet supported Sitting balance-Leahy Scale: Good     Standing balance support: Bilateral upper extremity supported, During functional activity Standing balance-Leahy Scale: Fair                             ADL either performed or assessed with clinical judgement   ADL Overall ADL's : Needs assistance/impaired Eating/Feeding: Sitting;Set up   Grooming: Wash/dry hands;Wash/dry face;Sitting;Standing;Supervision/safety;Set up Grooming Details (indicate cue type and reason): supervision for standing, SET UP sitting             Lower Body Dressing: Maximal assistance   Toilet Transfer: Min guard;Minimal assistance;BSC/3in1;Ambulation;Rolling walker (2 wheels)   Toileting- Clothing Manipulation and Hygiene: Maximal assistance;Sit to/from stand Toileting - Clothing Manipulation Details (indicate cue type and reason): peri care     Functional mobility during ADLs: Min guard;Rolling walker (2 wheels) (approx 25' to bathroom)  Vision Patient Visual Report: No change from baseline       Perception     Praxis      Pertinent Vitals/Pain Pain  Assessment Pain Assessment: 0-10 Pain Score: 4  Pain Location: R knee Pain Descriptors / Indicators: Aching, Grimacing, Guarding Pain Intervention(s): Limited activity within patient's tolerance, Monitored during session, Premedicated before session, Repositioned     Hand Dominance     Extremity/Trunk Assessment Upper Extremity Assessment Upper Extremity Assessment: Overall WFL for tasks assessed   Lower Extremity Assessment Lower Extremity Assessment: RLE deficits/detail;Defer to PT evaluation RLE Deficits / Details: s/p R TKA   Cervical / Trunk Assessment Cervical / Trunk Assessment: Normal   Communication Communication Communication: No difficulties   Cognition Arousal/Alertness: Awake/alert Behavior During Therapy: WFL for tasks assessed/performed Overall Cognitive Status: Within Functional Limits for tasks assessed                                       General Comments  after mobility BP was 95/58 HR 103 no nausea/vomiting,she was shaky in her hands however; wound vac intact throughout    Exercises     Shoulder Instructions      Home Living Family/patient expects to be discharged to:: Private residence Living Arrangements: Spouse/significant other Available Help at Discharge: Family;Available PRN/intermittently Type of Home: House Home Access: Stairs to enter [employer] of Steps: 5+1+1 Entrance Stairs-Rails: Right;Left Home Layout: One level     Bathroom Shower/Tub: Tub/shower unit;Walk-in shower     Bathroom Accessibility: Yes   Home Equipment: Rollator (4 wheels);BSC/3in1;Cane - single point;Shower seat          Prior Functioning/Environment Prior Level of Function : Independent/Modified Independent             Mobility Comments: MOD I with SPC for household/community distances ADLs Comments: MOD I in ADL/IADL        OT Problem List: Decreased strength;Decreased activity tolerance;Impaired balance (sitting  and/or standing);Decreased knowledge of use of DME or AE      OT Treatment/Interventions: Self-care/ADL training;Patient/family education;Therapeutic exercise;Manual therapy;Balance training;Energy conservation;Therapeutic activities;DME and/or AE instruction    OT Goals(Current goals can be found in the care plan section) Acute Rehab OT Goals Patient Stated Goal: go home OT Goal Formulation: With patient/family Time For Goal Achievement: 01/19/22 Potential to Achieve Goals: Good ADL Goals Pt Will Perform Lower Body Dressing: with modified independence;with adaptive equipment Pt Will Transfer to Toilet: ambulating;bedside commode Pt Will Perform Toileting - Clothing Manipulation and hygiene: with modified independence;sit to/from stand  OT Frequency: Min 2X/week    Co-evaluation              AM-PAC OT "6 Clicks" Daily Activity     Outcome Measure Help from another person eating meals?: None Help from another person taking care of personal grooming?: None Help from another person toileting, which includes using toliet, bedpan, or urinal?: A Lot Help from another person bathing (including washing, rinsing, drying)?: A Lot Help from another person to put on and taking off regular upper body clothing?: A Little Help from another person to put on and taking off regular lower body clothing?: A Lot 6 Click Score: 17   End of Session Equipment Utilized During Treatment: Gait belt;Rolling walker (2 wheels) Nurse Communication: Mobility status  Activity Tolerance: Patient tolerated treatment well Patient left: in chair;with call bell/phone within reach;with chair alarm set;with family/visitor present  OT  Visit Diagnosis: Unsteadiness on feet (R26.81)                Time: 7672-0947 OT Time Calculation (min): 28 min Charges:  OT General Charges $OT Visit: 1 Visit OT Evaluation $OT Eval Low Complexity: 1 Low  Shanon Payor, OTD OTR/L  01/05/22, 9:55 AM

## 2022-01-05 NOTE — Progress Notes (Signed)
Subjective: 1 Day Post-Op Procedure(s) (LRB): TOTAL KNEE ARTHROPLASTY (Right) Patient reports pain as mild.   Patient is well, and has had no acute complaints or problems Denies any CP, SOB, ABD pain. We will continue therapy today.  Plan is to go Home after hospital stay.  Objective: Vital signs in last 24 hours: Temp:  [97.1 F (36.2 C)-98.2 F (36.8 C)] 97.7 F (36.5 C) (07/21 0332) Pulse Rate:  [67-89] 84 (07/21 0332) Resp:  [11-18] 17 (07/21 0332) BP: (113-140)/(42-97) 127/59 (07/21 0332) SpO2:  [94 %-100 %] 97 % (07/21 0332)  Intake/Output from previous day: 07/20 0701 - 07/21 0700 In: 1332.9 [I.V.:1132.9; IV Piggyback:200] Out: 150 [Urine:125; Blood:25] Intake/Output this shift: Total I/O In: -  Out: 2300 [Urine:2300]  Recent Labs    01/04/22 1053  HGB 9.0*   Recent Labs    01/04/22 1053  WBC 10.5  RBC 3.39*  HCT 28.9*  PLT 259   Recent Labs    01/04/22 1053  CREATININE 1.00   No results for input(s): "LABPT", "INR" in the last 72 hours.  EXAM General - Patient is Alert, Appropriate, and Oriented Extremity - Neurovascular intact Sensation intact distally Intact pulses distally Dorsiflexion/Plantar flexion intact No cellulitis present Compartment soft Dressing - dressing C/D/I and no drainage, provena intact with out drainage Motor Function - intact, moving foot and toes well on exam.   Past Medical History:  Diagnosis Date   Absolute anemia 09/15/2015   Patient declines colonoscopy    Aortic stenosis, moderate 04/05/2017   a.) s/p TAVR 08/18/2018; LEFT axillary approach   Arthritis    Breast cancer, right ([place]) 2010   a.) s/p RIGHT mastectomy; no chemotherpay or XRT   CAD (coronary artery disease) 08/06/2018   a.) R/LHC 08/06/2018: EF 35%; 20% stenosis oRI; severe AS (MPG 48.2 mmHg; AVA (VTI) = 0.58 cm); mean PA = 36 mmHg, mean PCWP = 27 mmHg, PVR 1.78 WU, CO 5.06 L/min, CI 2.25 L/min/m   CHF (congestive heart failure) ([place])    a.)   TTE 07/29/2018: EF 35%; moderate global HK; moderate LVH; mild LA dilation; trivial TR/PR, mild AR/MR; moderate AS (MPG 48.2 mmHg); G2DD. b.) R/LHC 08/06/2018; EF 35%; mean PCWP 27 mmHg. c.)  TTE 10/20/2019: EF >55%; mild LVH; trivial MR/TR, mild PR, prostatic AoV. d.)  TTE 08/17/2021: EF >55%; mild LVH; mild LA dilation; trivial MR/TR/PR; prostatic AoV (MPG 12 mmHg); G1DD.   CKD (chronic kidney disease), stage III (HCC)    Ectopic pregnancy, tubal    History of transcatheter aortic valve replacement (TAVR) 08/18/2018   a.) 26 mm Medtronic Corevalve Evolute   HLD (hyperlipidemia)    Hypertension    Hypothyroidism    Leukocytosis 09/15/2015   Hematology eval - likely benign; follow up planned    Neuropathy    Post-menopausal bleeding 04/13/2016   US showed thickened endometrium; biopsy recommended but patient declined   Right adrenal mass ([place]) 02/06/2019   a.) CT CAP 08/14/2018: measured 4.3 x 3.3 cm. b.) CT abd 02/06/2019: measured 4.7 x 3.7 cm. c.) MRI lumbar spine 10/25/2021: interval increase to 7.1 x 7.1 cm.   T2DM (type 2 diabetes mellitus) (HCC)     Assessment/Plan:   1 Day Post-Op Procedure(s) (LRB): TOTAL KNEE ARTHROPLASTY (Right) Principal Problem:   S/P TKR (total knee replacement) using cement, right  Estimated body mass index is 36.49 kg/m as calculated from the following:   Height as of this encounter: '5\' 8"'$  (1.727 m).   Weight as  of this encounter: 108.9 kg. Advance diet Up with therapy Labs pending Pain controlled VSS CM to assist with discharge to home with HHPT today pending completion of Pt goals  DVT Prophylaxis - Lovenox, TED hose, and SCDs Weight-Bearing as tolerated to right leg   T. Rachelle Hora, PA-C [place] 01/05/2022, 8:15 AM

## 2022-01-07 DIAGNOSIS — E1169 Type 2 diabetes mellitus with other specified complication: Secondary | ICD-10-CM | POA: Diagnosis not present

## 2022-01-07 DIAGNOSIS — Z853 Personal history of malignant neoplasm of breast: Secondary | ICD-10-CM | POA: Diagnosis not present

## 2022-01-07 DIAGNOSIS — Z79891 Long term (current) use of opiate analgesic: Secondary | ICD-10-CM | POA: Diagnosis not present

## 2022-01-07 DIAGNOSIS — I081 Rheumatic disorders of both mitral and tricuspid valves: Secondary | ICD-10-CM | POA: Diagnosis not present

## 2022-01-07 DIAGNOSIS — E1122 Type 2 diabetes mellitus with diabetic chronic kidney disease: Secondary | ICD-10-CM | POA: Diagnosis not present

## 2022-01-07 DIAGNOSIS — I2510 Atherosclerotic heart disease of native coronary artery without angina pectoris: Secondary | ICD-10-CM | POA: Diagnosis not present

## 2022-01-07 DIAGNOSIS — Z7984 Long term (current) use of oral hypoglycemic drugs: Secondary | ICD-10-CM | POA: Diagnosis not present

## 2022-01-07 DIAGNOSIS — Z7901 Long term (current) use of anticoagulants: Secondary | ICD-10-CM | POA: Diagnosis not present

## 2022-01-07 DIAGNOSIS — Z9181 History of falling: Secondary | ICD-10-CM | POA: Diagnosis not present

## 2022-01-07 DIAGNOSIS — E785 Hyperlipidemia, unspecified: Secondary | ICD-10-CM | POA: Diagnosis not present

## 2022-01-07 DIAGNOSIS — Z96651 Presence of right artificial knee joint: Secondary | ICD-10-CM | POA: Diagnosis not present

## 2022-01-07 DIAGNOSIS — I700 Atherosclerosis of aorta: Secondary | ICD-10-CM | POA: Diagnosis not present

## 2022-01-07 DIAGNOSIS — E1142 Type 2 diabetes mellitus with diabetic polyneuropathy: Secondary | ICD-10-CM | POA: Diagnosis not present

## 2022-01-07 DIAGNOSIS — Z471 Aftercare following joint replacement surgery: Secondary | ICD-10-CM | POA: Diagnosis not present

## 2022-01-07 DIAGNOSIS — H9190 Unspecified hearing loss, unspecified ear: Secondary | ICD-10-CM | POA: Diagnosis not present

## 2022-01-07 DIAGNOSIS — I509 Heart failure, unspecified: Secondary | ICD-10-CM | POA: Diagnosis not present

## 2022-01-07 DIAGNOSIS — E039 Hypothyroidism, unspecified: Secondary | ICD-10-CM | POA: Diagnosis not present

## 2022-01-07 DIAGNOSIS — N1831 Chronic kidney disease, stage 3a: Secondary | ICD-10-CM | POA: Diagnosis not present

## 2022-01-07 DIAGNOSIS — I110 Hypertensive heart disease with heart failure: Secondary | ICD-10-CM | POA: Diagnosis not present

## 2022-01-08 DIAGNOSIS — E1122 Type 2 diabetes mellitus with diabetic chronic kidney disease: Secondary | ICD-10-CM | POA: Diagnosis not present

## 2022-01-08 DIAGNOSIS — Z471 Aftercare following joint replacement surgery: Secondary | ICD-10-CM | POA: Diagnosis not present

## 2022-01-08 DIAGNOSIS — I081 Rheumatic disorders of both mitral and tricuspid valves: Secondary | ICD-10-CM | POA: Diagnosis not present

## 2022-01-08 DIAGNOSIS — Z9181 History of falling: Secondary | ICD-10-CM | POA: Diagnosis not present

## 2022-01-08 DIAGNOSIS — Z79891 Long term (current) use of opiate analgesic: Secondary | ICD-10-CM | POA: Diagnosis not present

## 2022-01-08 DIAGNOSIS — Z96651 Presence of right artificial knee joint: Secondary | ICD-10-CM | POA: Diagnosis not present

## 2022-01-08 DIAGNOSIS — E785 Hyperlipidemia, unspecified: Secondary | ICD-10-CM | POA: Diagnosis not present

## 2022-01-08 DIAGNOSIS — Z7984 Long term (current) use of oral hypoglycemic drugs: Secondary | ICD-10-CM | POA: Diagnosis not present

## 2022-01-08 DIAGNOSIS — I2510 Atherosclerotic heart disease of native coronary artery without angina pectoris: Secondary | ICD-10-CM | POA: Diagnosis not present

## 2022-01-08 DIAGNOSIS — I509 Heart failure, unspecified: Secondary | ICD-10-CM | POA: Diagnosis not present

## 2022-01-08 DIAGNOSIS — H9190 Unspecified hearing loss, unspecified ear: Secondary | ICD-10-CM | POA: Diagnosis not present

## 2022-01-08 DIAGNOSIS — N1831 Chronic kidney disease, stage 3a: Secondary | ICD-10-CM | POA: Diagnosis not present

## 2022-01-08 DIAGNOSIS — I700 Atherosclerosis of aorta: Secondary | ICD-10-CM | POA: Diagnosis not present

## 2022-01-08 DIAGNOSIS — Z7901 Long term (current) use of anticoagulants: Secondary | ICD-10-CM | POA: Diagnosis not present

## 2022-01-08 DIAGNOSIS — E1142 Type 2 diabetes mellitus with diabetic polyneuropathy: Secondary | ICD-10-CM | POA: Diagnosis not present

## 2022-01-08 DIAGNOSIS — Z853 Personal history of malignant neoplasm of breast: Secondary | ICD-10-CM | POA: Diagnosis not present

## 2022-01-08 DIAGNOSIS — E039 Hypothyroidism, unspecified: Secondary | ICD-10-CM | POA: Diagnosis not present

## 2022-01-08 DIAGNOSIS — E1169 Type 2 diabetes mellitus with other specified complication: Secondary | ICD-10-CM | POA: Diagnosis not present

## 2022-01-08 DIAGNOSIS — I110 Hypertensive heart disease with heart failure: Secondary | ICD-10-CM | POA: Diagnosis not present

## 2022-01-10 DIAGNOSIS — Z9181 History of falling: Secondary | ICD-10-CM | POA: Diagnosis not present

## 2022-01-10 DIAGNOSIS — Z79891 Long term (current) use of opiate analgesic: Secondary | ICD-10-CM | POA: Diagnosis not present

## 2022-01-10 DIAGNOSIS — E1142 Type 2 diabetes mellitus with diabetic polyneuropathy: Secondary | ICD-10-CM | POA: Diagnosis not present

## 2022-01-10 DIAGNOSIS — E039 Hypothyroidism, unspecified: Secondary | ICD-10-CM | POA: Diagnosis not present

## 2022-01-10 DIAGNOSIS — H9190 Unspecified hearing loss, unspecified ear: Secondary | ICD-10-CM | POA: Diagnosis not present

## 2022-01-10 DIAGNOSIS — Z853 Personal history of malignant neoplasm of breast: Secondary | ICD-10-CM | POA: Diagnosis not present

## 2022-01-10 DIAGNOSIS — E785 Hyperlipidemia, unspecified: Secondary | ICD-10-CM | POA: Diagnosis not present

## 2022-01-10 DIAGNOSIS — Z96651 Presence of right artificial knee joint: Secondary | ICD-10-CM | POA: Diagnosis not present

## 2022-01-10 DIAGNOSIS — N1831 Chronic kidney disease, stage 3a: Secondary | ICD-10-CM | POA: Diagnosis not present

## 2022-01-10 DIAGNOSIS — E1122 Type 2 diabetes mellitus with diabetic chronic kidney disease: Secondary | ICD-10-CM | POA: Diagnosis not present

## 2022-01-10 DIAGNOSIS — E1169 Type 2 diabetes mellitus with other specified complication: Secondary | ICD-10-CM | POA: Diagnosis not present

## 2022-01-10 DIAGNOSIS — I700 Atherosclerosis of aorta: Secondary | ICD-10-CM | POA: Diagnosis not present

## 2022-01-10 DIAGNOSIS — Z7901 Long term (current) use of anticoagulants: Secondary | ICD-10-CM | POA: Diagnosis not present

## 2022-01-10 DIAGNOSIS — I509 Heart failure, unspecified: Secondary | ICD-10-CM | POA: Diagnosis not present

## 2022-01-10 DIAGNOSIS — I2510 Atherosclerotic heart disease of native coronary artery without angina pectoris: Secondary | ICD-10-CM | POA: Diagnosis not present

## 2022-01-10 DIAGNOSIS — I081 Rheumatic disorders of both mitral and tricuspid valves: Secondary | ICD-10-CM | POA: Diagnosis not present

## 2022-01-10 DIAGNOSIS — Z471 Aftercare following joint replacement surgery: Secondary | ICD-10-CM | POA: Diagnosis not present

## 2022-01-10 DIAGNOSIS — I110 Hypertensive heart disease with heart failure: Secondary | ICD-10-CM | POA: Diagnosis not present

## 2022-01-10 DIAGNOSIS — Z7984 Long term (current) use of oral hypoglycemic drugs: Secondary | ICD-10-CM | POA: Diagnosis not present

## 2022-01-12 DIAGNOSIS — Z471 Aftercare following joint replacement surgery: Secondary | ICD-10-CM | POA: Diagnosis not present

## 2022-01-12 DIAGNOSIS — E1122 Type 2 diabetes mellitus with diabetic chronic kidney disease: Secondary | ICD-10-CM | POA: Diagnosis not present

## 2022-01-12 DIAGNOSIS — Z79891 Long term (current) use of opiate analgesic: Secondary | ICD-10-CM | POA: Diagnosis not present

## 2022-01-12 DIAGNOSIS — I081 Rheumatic disorders of both mitral and tricuspid valves: Secondary | ICD-10-CM | POA: Diagnosis not present

## 2022-01-12 DIAGNOSIS — Z853 Personal history of malignant neoplasm of breast: Secondary | ICD-10-CM | POA: Diagnosis not present

## 2022-01-12 DIAGNOSIS — Z7984 Long term (current) use of oral hypoglycemic drugs: Secondary | ICD-10-CM | POA: Diagnosis not present

## 2022-01-12 DIAGNOSIS — I110 Hypertensive heart disease with heart failure: Secondary | ICD-10-CM | POA: Diagnosis not present

## 2022-01-12 DIAGNOSIS — Z9181 History of falling: Secondary | ICD-10-CM | POA: Diagnosis not present

## 2022-01-12 DIAGNOSIS — I700 Atherosclerosis of aorta: Secondary | ICD-10-CM | POA: Diagnosis not present

## 2022-01-12 DIAGNOSIS — E1169 Type 2 diabetes mellitus with other specified complication: Secondary | ICD-10-CM | POA: Diagnosis not present

## 2022-01-12 DIAGNOSIS — Z7901 Long term (current) use of anticoagulants: Secondary | ICD-10-CM | POA: Diagnosis not present

## 2022-01-12 DIAGNOSIS — I509 Heart failure, unspecified: Secondary | ICD-10-CM | POA: Diagnosis not present

## 2022-01-12 DIAGNOSIS — I2510 Atherosclerotic heart disease of native coronary artery without angina pectoris: Secondary | ICD-10-CM | POA: Diagnosis not present

## 2022-01-12 DIAGNOSIS — Z96651 Presence of right artificial knee joint: Secondary | ICD-10-CM | POA: Diagnosis not present

## 2022-01-12 DIAGNOSIS — E039 Hypothyroidism, unspecified: Secondary | ICD-10-CM | POA: Diagnosis not present

## 2022-01-12 DIAGNOSIS — E1142 Type 2 diabetes mellitus with diabetic polyneuropathy: Secondary | ICD-10-CM | POA: Diagnosis not present

## 2022-01-12 DIAGNOSIS — H9190 Unspecified hearing loss, unspecified ear: Secondary | ICD-10-CM | POA: Diagnosis not present

## 2022-01-12 DIAGNOSIS — E785 Hyperlipidemia, unspecified: Secondary | ICD-10-CM | POA: Diagnosis not present

## 2022-01-12 DIAGNOSIS — N1831 Chronic kidney disease, stage 3a: Secondary | ICD-10-CM | POA: Diagnosis not present

## 2022-01-13 ENCOUNTER — Encounter: Payer: Self-pay | Admitting: Internal Medicine

## 2022-01-14 ENCOUNTER — Other Ambulatory Visit: Payer: Self-pay | Admitting: Internal Medicine

## 2022-01-14 DIAGNOSIS — E1142 Type 2 diabetes mellitus with diabetic polyneuropathy: Secondary | ICD-10-CM

## 2022-01-15 DIAGNOSIS — E039 Hypothyroidism, unspecified: Secondary | ICD-10-CM | POA: Diagnosis not present

## 2022-01-15 DIAGNOSIS — Z471 Aftercare following joint replacement surgery: Secondary | ICD-10-CM | POA: Diagnosis not present

## 2022-01-15 DIAGNOSIS — Z96651 Presence of right artificial knee joint: Secondary | ICD-10-CM | POA: Diagnosis not present

## 2022-01-15 DIAGNOSIS — H9190 Unspecified hearing loss, unspecified ear: Secondary | ICD-10-CM | POA: Diagnosis not present

## 2022-01-15 DIAGNOSIS — Z7901 Long term (current) use of anticoagulants: Secondary | ICD-10-CM | POA: Diagnosis not present

## 2022-01-15 DIAGNOSIS — Z9181 History of falling: Secondary | ICD-10-CM | POA: Diagnosis not present

## 2022-01-15 DIAGNOSIS — E1169 Type 2 diabetes mellitus with other specified complication: Secondary | ICD-10-CM | POA: Diagnosis not present

## 2022-01-15 DIAGNOSIS — Z79891 Long term (current) use of opiate analgesic: Secondary | ICD-10-CM | POA: Diagnosis not present

## 2022-01-15 DIAGNOSIS — Z853 Personal history of malignant neoplasm of breast: Secondary | ICD-10-CM | POA: Diagnosis not present

## 2022-01-15 DIAGNOSIS — N1831 Chronic kidney disease, stage 3a: Secondary | ICD-10-CM | POA: Diagnosis not present

## 2022-01-15 DIAGNOSIS — Z7984 Long term (current) use of oral hypoglycemic drugs: Secondary | ICD-10-CM | POA: Diagnosis not present

## 2022-01-15 DIAGNOSIS — I110 Hypertensive heart disease with heart failure: Secondary | ICD-10-CM | POA: Diagnosis not present

## 2022-01-15 DIAGNOSIS — E785 Hyperlipidemia, unspecified: Secondary | ICD-10-CM | POA: Diagnosis not present

## 2022-01-15 DIAGNOSIS — E1122 Type 2 diabetes mellitus with diabetic chronic kidney disease: Secondary | ICD-10-CM | POA: Diagnosis not present

## 2022-01-15 DIAGNOSIS — I509 Heart failure, unspecified: Secondary | ICD-10-CM | POA: Diagnosis not present

## 2022-01-15 DIAGNOSIS — E1142 Type 2 diabetes mellitus with diabetic polyneuropathy: Secondary | ICD-10-CM | POA: Diagnosis not present

## 2022-01-15 DIAGNOSIS — I081 Rheumatic disorders of both mitral and tricuspid valves: Secondary | ICD-10-CM | POA: Diagnosis not present

## 2022-01-15 DIAGNOSIS — I2510 Atherosclerotic heart disease of native coronary artery without angina pectoris: Secondary | ICD-10-CM | POA: Diagnosis not present

## 2022-01-15 DIAGNOSIS — I700 Atherosclerosis of aorta: Secondary | ICD-10-CM | POA: Diagnosis not present

## 2022-01-17 DIAGNOSIS — N1831 Chronic kidney disease, stage 3a: Secondary | ICD-10-CM | POA: Diagnosis not present

## 2022-01-17 DIAGNOSIS — Z7984 Long term (current) use of oral hypoglycemic drugs: Secondary | ICD-10-CM | POA: Diagnosis not present

## 2022-01-17 DIAGNOSIS — Z471 Aftercare following joint replacement surgery: Secondary | ICD-10-CM | POA: Diagnosis not present

## 2022-01-17 DIAGNOSIS — E785 Hyperlipidemia, unspecified: Secondary | ICD-10-CM | POA: Diagnosis not present

## 2022-01-17 DIAGNOSIS — Z853 Personal history of malignant neoplasm of breast: Secondary | ICD-10-CM | POA: Diagnosis not present

## 2022-01-17 DIAGNOSIS — E1122 Type 2 diabetes mellitus with diabetic chronic kidney disease: Secondary | ICD-10-CM | POA: Diagnosis not present

## 2022-01-17 DIAGNOSIS — I700 Atherosclerosis of aorta: Secondary | ICD-10-CM | POA: Diagnosis not present

## 2022-01-17 DIAGNOSIS — Z7901 Long term (current) use of anticoagulants: Secondary | ICD-10-CM | POA: Diagnosis not present

## 2022-01-17 DIAGNOSIS — E1142 Type 2 diabetes mellitus with diabetic polyneuropathy: Secondary | ICD-10-CM | POA: Diagnosis not present

## 2022-01-17 DIAGNOSIS — I2510 Atherosclerotic heart disease of native coronary artery without angina pectoris: Secondary | ICD-10-CM | POA: Diagnosis not present

## 2022-01-17 DIAGNOSIS — Z9181 History of falling: Secondary | ICD-10-CM | POA: Diagnosis not present

## 2022-01-17 DIAGNOSIS — I509 Heart failure, unspecified: Secondary | ICD-10-CM | POA: Diagnosis not present

## 2022-01-17 DIAGNOSIS — Z96651 Presence of right artificial knee joint: Secondary | ICD-10-CM | POA: Diagnosis not present

## 2022-01-17 DIAGNOSIS — I110 Hypertensive heart disease with heart failure: Secondary | ICD-10-CM | POA: Diagnosis not present

## 2022-01-17 DIAGNOSIS — E039 Hypothyroidism, unspecified: Secondary | ICD-10-CM | POA: Diagnosis not present

## 2022-01-17 DIAGNOSIS — I081 Rheumatic disorders of both mitral and tricuspid valves: Secondary | ICD-10-CM | POA: Diagnosis not present

## 2022-01-17 DIAGNOSIS — Z79891 Long term (current) use of opiate analgesic: Secondary | ICD-10-CM | POA: Diagnosis not present

## 2022-01-17 DIAGNOSIS — H9190 Unspecified hearing loss, unspecified ear: Secondary | ICD-10-CM | POA: Diagnosis not present

## 2022-01-17 DIAGNOSIS — E1169 Type 2 diabetes mellitus with other specified complication: Secondary | ICD-10-CM | POA: Diagnosis not present

## 2022-01-19 DIAGNOSIS — E039 Hypothyroidism, unspecified: Secondary | ICD-10-CM | POA: Diagnosis not present

## 2022-01-19 DIAGNOSIS — Z7901 Long term (current) use of anticoagulants: Secondary | ICD-10-CM | POA: Diagnosis not present

## 2022-01-19 DIAGNOSIS — Z96651 Presence of right artificial knee joint: Secondary | ICD-10-CM | POA: Diagnosis not present

## 2022-01-19 DIAGNOSIS — Z471 Aftercare following joint replacement surgery: Secondary | ICD-10-CM | POA: Diagnosis not present

## 2022-01-19 DIAGNOSIS — I700 Atherosclerosis of aorta: Secondary | ICD-10-CM | POA: Diagnosis not present

## 2022-01-19 DIAGNOSIS — E1142 Type 2 diabetes mellitus with diabetic polyneuropathy: Secondary | ICD-10-CM | POA: Diagnosis not present

## 2022-01-19 DIAGNOSIS — Z7984 Long term (current) use of oral hypoglycemic drugs: Secondary | ICD-10-CM | POA: Diagnosis not present

## 2022-01-19 DIAGNOSIS — Z9181 History of falling: Secondary | ICD-10-CM | POA: Diagnosis not present

## 2022-01-19 DIAGNOSIS — H9190 Unspecified hearing loss, unspecified ear: Secondary | ICD-10-CM | POA: Diagnosis not present

## 2022-01-19 DIAGNOSIS — Z853 Personal history of malignant neoplasm of breast: Secondary | ICD-10-CM | POA: Diagnosis not present

## 2022-01-19 DIAGNOSIS — E785 Hyperlipidemia, unspecified: Secondary | ICD-10-CM | POA: Diagnosis not present

## 2022-01-19 DIAGNOSIS — I110 Hypertensive heart disease with heart failure: Secondary | ICD-10-CM | POA: Diagnosis not present

## 2022-01-19 DIAGNOSIS — I2510 Atherosclerotic heart disease of native coronary artery without angina pectoris: Secondary | ICD-10-CM | POA: Diagnosis not present

## 2022-01-19 DIAGNOSIS — N1831 Chronic kidney disease, stage 3a: Secondary | ICD-10-CM | POA: Diagnosis not present

## 2022-01-19 DIAGNOSIS — Z79891 Long term (current) use of opiate analgesic: Secondary | ICD-10-CM | POA: Diagnosis not present

## 2022-01-19 DIAGNOSIS — E1169 Type 2 diabetes mellitus with other specified complication: Secondary | ICD-10-CM | POA: Diagnosis not present

## 2022-01-19 DIAGNOSIS — E1122 Type 2 diabetes mellitus with diabetic chronic kidney disease: Secondary | ICD-10-CM | POA: Diagnosis not present

## 2022-01-19 DIAGNOSIS — I081 Rheumatic disorders of both mitral and tricuspid valves: Secondary | ICD-10-CM | POA: Diagnosis not present

## 2022-01-19 DIAGNOSIS — I509 Heart failure, unspecified: Secondary | ICD-10-CM | POA: Diagnosis not present

## 2022-01-24 DIAGNOSIS — Z79891 Long term (current) use of opiate analgesic: Secondary | ICD-10-CM | POA: Diagnosis not present

## 2022-01-24 DIAGNOSIS — E1122 Type 2 diabetes mellitus with diabetic chronic kidney disease: Secondary | ICD-10-CM | POA: Diagnosis not present

## 2022-01-24 DIAGNOSIS — I700 Atherosclerosis of aorta: Secondary | ICD-10-CM | POA: Diagnosis not present

## 2022-01-24 DIAGNOSIS — Z7984 Long term (current) use of oral hypoglycemic drugs: Secondary | ICD-10-CM | POA: Diagnosis not present

## 2022-01-24 DIAGNOSIS — E785 Hyperlipidemia, unspecified: Secondary | ICD-10-CM | POA: Diagnosis not present

## 2022-01-24 DIAGNOSIS — I509 Heart failure, unspecified: Secondary | ICD-10-CM | POA: Diagnosis not present

## 2022-01-24 DIAGNOSIS — Z7901 Long term (current) use of anticoagulants: Secondary | ICD-10-CM | POA: Diagnosis not present

## 2022-01-24 DIAGNOSIS — E1169 Type 2 diabetes mellitus with other specified complication: Secondary | ICD-10-CM | POA: Diagnosis not present

## 2022-01-24 DIAGNOSIS — I081 Rheumatic disorders of both mitral and tricuspid valves: Secondary | ICD-10-CM | POA: Diagnosis not present

## 2022-01-24 DIAGNOSIS — H9190 Unspecified hearing loss, unspecified ear: Secondary | ICD-10-CM | POA: Diagnosis not present

## 2022-01-24 DIAGNOSIS — I2510 Atherosclerotic heart disease of native coronary artery without angina pectoris: Secondary | ICD-10-CM | POA: Diagnosis not present

## 2022-01-24 DIAGNOSIS — I110 Hypertensive heart disease with heart failure: Secondary | ICD-10-CM | POA: Diagnosis not present

## 2022-01-24 DIAGNOSIS — Z96651 Presence of right artificial knee joint: Secondary | ICD-10-CM | POA: Diagnosis not present

## 2022-01-24 DIAGNOSIS — E039 Hypothyroidism, unspecified: Secondary | ICD-10-CM | POA: Diagnosis not present

## 2022-01-24 DIAGNOSIS — E1142 Type 2 diabetes mellitus with diabetic polyneuropathy: Secondary | ICD-10-CM | POA: Diagnosis not present

## 2022-01-24 DIAGNOSIS — Z471 Aftercare following joint replacement surgery: Secondary | ICD-10-CM | POA: Diagnosis not present

## 2022-01-24 DIAGNOSIS — Z853 Personal history of malignant neoplasm of breast: Secondary | ICD-10-CM | POA: Diagnosis not present

## 2022-01-24 DIAGNOSIS — Z9181 History of falling: Secondary | ICD-10-CM | POA: Diagnosis not present

## 2022-01-24 DIAGNOSIS — N1831 Chronic kidney disease, stage 3a: Secondary | ICD-10-CM | POA: Diagnosis not present

## 2022-01-26 DIAGNOSIS — I700 Atherosclerosis of aorta: Secondary | ICD-10-CM | POA: Diagnosis not present

## 2022-01-26 DIAGNOSIS — Z96651 Presence of right artificial knee joint: Secondary | ICD-10-CM | POA: Diagnosis not present

## 2022-01-26 DIAGNOSIS — E785 Hyperlipidemia, unspecified: Secondary | ICD-10-CM | POA: Diagnosis not present

## 2022-01-26 DIAGNOSIS — E1122 Type 2 diabetes mellitus with diabetic chronic kidney disease: Secondary | ICD-10-CM | POA: Diagnosis not present

## 2022-01-26 DIAGNOSIS — I110 Hypertensive heart disease with heart failure: Secondary | ICD-10-CM | POA: Diagnosis not present

## 2022-01-26 DIAGNOSIS — I081 Rheumatic disorders of both mitral and tricuspid valves: Secondary | ICD-10-CM | POA: Diagnosis not present

## 2022-01-26 DIAGNOSIS — Z79891 Long term (current) use of opiate analgesic: Secondary | ICD-10-CM | POA: Diagnosis not present

## 2022-01-26 DIAGNOSIS — N1831 Chronic kidney disease, stage 3a: Secondary | ICD-10-CM | POA: Diagnosis not present

## 2022-01-26 DIAGNOSIS — H9190 Unspecified hearing loss, unspecified ear: Secondary | ICD-10-CM | POA: Diagnosis not present

## 2022-01-26 DIAGNOSIS — E1169 Type 2 diabetes mellitus with other specified complication: Secondary | ICD-10-CM | POA: Diagnosis not present

## 2022-01-26 DIAGNOSIS — Z9181 History of falling: Secondary | ICD-10-CM | POA: Diagnosis not present

## 2022-01-26 DIAGNOSIS — Z853 Personal history of malignant neoplasm of breast: Secondary | ICD-10-CM | POA: Diagnosis not present

## 2022-01-26 DIAGNOSIS — E039 Hypothyroidism, unspecified: Secondary | ICD-10-CM | POA: Diagnosis not present

## 2022-01-26 DIAGNOSIS — I2510 Atherosclerotic heart disease of native coronary artery without angina pectoris: Secondary | ICD-10-CM | POA: Diagnosis not present

## 2022-01-26 DIAGNOSIS — I509 Heart failure, unspecified: Secondary | ICD-10-CM | POA: Diagnosis not present

## 2022-01-26 DIAGNOSIS — Z7901 Long term (current) use of anticoagulants: Secondary | ICD-10-CM | POA: Diagnosis not present

## 2022-01-26 DIAGNOSIS — E1142 Type 2 diabetes mellitus with diabetic polyneuropathy: Secondary | ICD-10-CM | POA: Diagnosis not present

## 2022-01-26 DIAGNOSIS — Z471 Aftercare following joint replacement surgery: Secondary | ICD-10-CM | POA: Diagnosis not present

## 2022-01-26 DIAGNOSIS — Z7984 Long term (current) use of oral hypoglycemic drugs: Secondary | ICD-10-CM | POA: Diagnosis not present

## 2022-01-29 DIAGNOSIS — Z96651 Presence of right artificial knee joint: Secondary | ICD-10-CM | POA: Diagnosis not present

## 2022-02-02 DIAGNOSIS — Z96651 Presence of right artificial knee joint: Secondary | ICD-10-CM | POA: Diagnosis not present

## 2022-02-05 DIAGNOSIS — E278 Other specified disorders of adrenal gland: Secondary | ICD-10-CM | POA: Diagnosis not present

## 2022-02-05 DIAGNOSIS — E039 Hypothyroidism, unspecified: Secondary | ICD-10-CM | POA: Diagnosis not present

## 2022-02-05 LAB — TSH: TSH: 3.08 (ref 0.41–5.90)

## 2022-02-06 DIAGNOSIS — Z96651 Presence of right artificial knee joint: Secondary | ICD-10-CM | POA: Diagnosis not present

## 2022-02-08 ENCOUNTER — Ambulatory Visit (INDEPENDENT_AMBULATORY_CARE_PROVIDER_SITE_OTHER): Payer: Medicare Other | Admitting: Neurosurgery

## 2022-02-08 DIAGNOSIS — Z96651 Presence of right artificial knee joint: Secondary | ICD-10-CM | POA: Diagnosis not present

## 2022-02-08 DIAGNOSIS — M5416 Radiculopathy, lumbar region: Secondary | ICD-10-CM

## 2022-02-08 DIAGNOSIS — Z09 Encounter for follow-up examination after completed treatment for conditions other than malignant neoplasm: Secondary | ICD-10-CM

## 2022-02-08 NOTE — Progress Notes (Signed)
   DOS: 11/08/21 right L5-S1 microdiscectomy   HISTORY OF PRESENT ILLNESS: 02/08/2022 [name] is status post right-sided L5-S1 microdiscectomy.  She is doing some PT for her knee.  That has caused minor pain, but she is happy with her improvements.    PHYSICAL EXAMINATION:  No exam today - telephone visit   IMAGING: No interval imaging to review   ASSESSMENT/PLAN:  Kendra Walton is doing well after microdiscectomy.  Is currently recuperating from her knee replacement.  She is making progress on that.    Patient location: home Provider location: office  I spent a total of 5 minutes non-face-to-face activities for this visit on the date of this encounter including review of current clinical condition and response to treatment.   I will see her back on an as-needed basis. Meade Maw MD, [facility] Department of Neurosurgery

## 2022-02-12 ENCOUNTER — Other Ambulatory Visit: Payer: Self-pay | Admitting: "Endocrinology

## 2022-02-12 DIAGNOSIS — E278 Other specified disorders of adrenal gland: Secondary | ICD-10-CM | POA: Diagnosis not present

## 2022-02-12 DIAGNOSIS — E039 Hypothyroidism, unspecified: Secondary | ICD-10-CM | POA: Diagnosis not present

## 2022-02-13 ENCOUNTER — Other Ambulatory Visit: Payer: Self-pay | Admitting: "Endocrinology

## 2022-02-13 DIAGNOSIS — E278 Other specified disorders of adrenal gland: Secondary | ICD-10-CM

## 2022-02-13 DIAGNOSIS — Z96651 Presence of right artificial knee joint: Secondary | ICD-10-CM | POA: Diagnosis not present

## 2022-02-15 DIAGNOSIS — Z96651 Presence of right artificial knee joint: Secondary | ICD-10-CM | POA: Diagnosis not present

## 2022-02-20 ENCOUNTER — Ambulatory Visit (INDEPENDENT_AMBULATORY_CARE_PROVIDER_SITE_OTHER): Payer: Medicare Other | Admitting: Internal Medicine

## 2022-02-20 ENCOUNTER — Encounter: Payer: Self-pay | Admitting: Internal Medicine

## 2022-02-20 VITALS — BP 112/62 | HR 98 | Ht 68.0 in | Wt 233.4 lb

## 2022-02-20 DIAGNOSIS — E039 Hypothyroidism, unspecified: Secondary | ICD-10-CM | POA: Diagnosis not present

## 2022-02-20 DIAGNOSIS — Z1231 Encounter for screening mammogram for malignant neoplasm of breast: Secondary | ICD-10-CM | POA: Diagnosis not present

## 2022-02-20 DIAGNOSIS — E118 Type 2 diabetes mellitus with unspecified complications: Secondary | ICD-10-CM

## 2022-02-20 DIAGNOSIS — I10 Essential (primary) hypertension: Secondary | ICD-10-CM | POA: Diagnosis not present

## 2022-02-20 DIAGNOSIS — E785 Hyperlipidemia, unspecified: Secondary | ICD-10-CM

## 2022-02-20 DIAGNOSIS — Z1211 Encounter for screening for malignant neoplasm of colon: Secondary | ICD-10-CM | POA: Diagnosis not present

## 2022-02-20 DIAGNOSIS — Z0000 Encounter for general adult medical examination without abnormal findings: Secondary | ICD-10-CM

## 2022-02-20 DIAGNOSIS — N1831 Chronic kidney disease, stage 3a: Secondary | ICD-10-CM | POA: Diagnosis not present

## 2022-02-20 DIAGNOSIS — E1169 Type 2 diabetes mellitus with other specified complication: Secondary | ICD-10-CM | POA: Diagnosis not present

## 2022-02-20 DIAGNOSIS — N1832 Chronic kidney disease, stage 3b: Secondary | ICD-10-CM | POA: Insufficient documentation

## 2022-02-20 DIAGNOSIS — Z23 Encounter for immunization: Secondary | ICD-10-CM

## 2022-02-20 NOTE — Progress Notes (Signed)
Date:  02/20/2022   Name:  Kendra Walton   DOB:  06/06/1951   MRN:  [id number]   Chief Complaint: Annual Exam Kendra Walton is a 71 y.o. female who presents today for her Complete Annual Exam. She feels well. She reports exercising - none. She reports she is sleeping well. Breast complaints - none. She is recovering well from her left TKA.  Mammogram: 03/2021 DEXA: none Pap smear: discontinued Colonoscopy: none  Health Maintenance Due  Topic Date Due   TETANUS/TDAP  Never done   COLONOSCOPY (Pts 45-56yr Insurance coverage will need to be confirmed)  Never done   Zoster Vaccines- Shingrix (1 of 2) Never done   DEXA SCAN  Never done   COVID-19 Vaccine (5 - Pfizer series) 08/18/2020   INFLUENZA VACCINE  01/16/2022   FOOT EXAM  02/15/2022    Immunization History  Administered Date(s) Administered   Fluad Quad(high Dose 65+) 05/07/2019, 05/09/2020, 02/15/2021   Influenza, High Dose Seasonal PF 04/29/2018   Influenza,inj,Quad PF,6+ Mos 04/07/2015, 04/13/2016, 07/24/2017   Influenza-Unspecified 05/19/2014   Moderna Sars-Covid-2 Vaccination 06/23/2020   PFIZER(Purple Top)SARS-COV-2 Vaccination 08/16/2019, 09/07/2019, 06/15/2020   Pneumococcal Conjugate-13 08/08/2015   Pneumococcal Polysaccharide-23 12/25/2017    Diabetes She presents for her follow-up diabetic visit. She has type 2 diabetes mellitus. Her disease course has been stable. Pertinent negatives for hypoglycemia include no dizziness, headaches, nervousness/anxiousness or tremors. Pertinent negatives for diabetes include no chest pain, no fatigue, no polydipsia and no polyuria. Pertinent negatives for diabetic complications include no CVA. Current diabetic treatment includes oral agent (monotherapy) (metformin). Her breakfast blood glucose is taken between 6-7 am. Her breakfast blood glucose range is generally 110-130 mg/dl. An ACE inhibitor/angiotensin II receptor blocker is being taken.   Hypertension This is a chronic problem. Pertinent negatives include no chest pain, headaches, palpitations or shortness of breath. Past treatments include ACE inhibitors and diuretics. The current treatment provides significant improvement. Hypertensive end-organ damage includes kidney disease. There is no history of CAD/MI or CVA.  Hyperlipidemia This is a chronic problem. The problem is controlled. Pertinent negatives include no chest pain or shortness of breath. Current antihyperlipidemic treatment includes statins.  Neuropathy - on Lyrica.  Lab Results  Component Value Date   NA 139 01/05/2022   K 4.9 01/05/2022   CO2 26 01/05/2022   GLUCOSE 151 (H) 01/05/2022   BUN 20 01/05/2022   CREATININE 1.05 (H) 01/05/2022   CALCIUM 9.0 01/05/2022   EGFR 53 (L) 06/20/2021   GFRNONAA 56 (L) 01/05/2022   Lab Results  Component Value Date   CHOL 146 02/15/2021   HDL 39 (L) 02/15/2021   LDLCALC 72 02/15/2021   TRIG 212 (H) 02/15/2021   CHOLHDL 3.7 02/15/2021   Lab Results  Component Value Date   TSH 3.08 02/05/2022   Lab Results  Component Value Date   HGBA1C 6.0 (A) 12/06/2021   Lab Results  Component Value Date   WBC 11.4 (H) 01/05/2022   HGB 9.5 (L) 01/05/2022   HCT 30.5 (L) 01/05/2022   MCV 84.7 01/05/2022   PLT 305 01/05/2022   Lab Results  Component Value Date   ALT 64 (H) 12/26/2021   AST 62 (H) 12/26/2021   ALKPHOS 71 12/26/2021   BILITOT 0.5 12/26/2021   No results found for: "25OHVITD2", "25OHVITD3", "VD25OH"   Review of Systems  Constitutional:  Negative for chills, fatigue and fever.  HENT:  Negative for congestion, hearing loss, tinnitus, trouble swallowing and voice change.  Eyes:  Negative for visual disturbance.  Respiratory:  Negative for cough, chest tightness, shortness of breath and wheezing.   Cardiovascular:  Negative for chest pain, palpitations and leg swelling.  Gastrointestinal:  Negative for abdominal pain, constipation, diarrhea and  vomiting.  Endocrine: Negative for polydipsia and polyuria.  Genitourinary:  Negative for dysuria, frequency, genital sores, vaginal bleeding and vaginal discharge.  Musculoskeletal:  Negative for arthralgias, gait problem and joint swelling.  Skin:  Negative for color change and rash.  Neurological:  Negative for dizziness, tremors, light-headedness and headaches.  Hematological:  Negative for adenopathy. Does not bruise/bleed easily.  Psychiatric/Behavioral:  Negative for dysphoric mood and sleep disturbance. The patient is not nervous/anxious.     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   Obesity (BMI 30-39.9) 07/09/2018   Hearing decreased 12/13/2015   Hyperlipidemia associated with type 2 diabetes mellitus ([place]) 09/14/2015   History of breast cancer in female 04/07/2015   Acquired hypothyroidism 10/04/2014   Diabetic peripheral neuropathy ([place]) 10/04/2014   Essential (primary) hypertension 10/04/2014   DM (diabetes mellitus), type 2 with neurological complications ([place]) 09/98/3382    Allergies  Allergen Reactions   Shellfish Allergy Hives and Itching    Past Surgical History:  Procedure Laterality Date   APPENDECTOMY     CATARACT EXTRACTION W/ INTRAOCULAR LENS  IMPLANT, BILATERAL  2010   CESAREAN SECTION  1983   CHOLECYSTECTOMY  2005   ECTOPIC PREGNANCY SURGERY     LUMBAR LAMINECTOMY/DECOMPRESSION MICRODISCECTOMY Right 11/08/2021   Procedure: RIGHT L5-S1 MICRODISCECTOMY;  Surgeon: Meade Maw, MD;  Location: [facility];  Service: Neurosurgery;  Laterality: Right;   MASTECTOMY, PARTIAL Right 2010   REDUCTION MAMMAPLASTY Left 2011   RIGHT/LEFT HEART CATH AND CORONARY  ANGIOGRAPHY Bilateral 08/06/2018   Procedure: RIGHT/LEFT HEART CATH AND CORONARY ANGIOGRAPHY;  Surgeon: Teodoro Spray, MD;  Location: [facility];  Service: Cardiovascular;  Laterality: Bilateral;   TOTAL KNEE ARTHROPLASTY Right 01/04/2022   Procedure: TOTAL KNEE ARTHROPLASTY;  Surgeon: Hessie Knows, MD;  Location: [facility];  Service: Orthopedics;  Laterality: Right;   TRANSCATHETER AORTIC VALVE REPLACEMENT, TRANSAPICAL  08/18/2018   Procedure: TRANSCATHETER AORTIC VALVE REPLACEMENT; Location: [facility]; Surgeon: Sharmon Leyden, MD    Social History   Tobacco Use   Smoking status: Former    Packs/day: 1.00    Years: 40.00    Total pack years: 40.00    Types: Cigarettes    Quit date: 06/18/2012    Years since quitting: 9.6   Smokeless tobacco: Never  Vaping Use   Vaping Use: Never used  Substance Use Topics   Alcohol use: No    Alcohol/week: 0.0 standard drinks of alcohol   Drug use: No     Medication list has been reviewed and updated.  Current Meds  Medication Sig   atorvastatin (LIPITOR) 20 MG tablet Take 1 tablet (20 mg total) by mouth at bedtime.   docusate sodium (COLACE) 100 MG capsule Take 1 capsule (100 mg total) by mouth 2 (two) times daily.   glucose blood (ONE TOUCH ULTRA TEST) test strip Use to test BS twice a day   Lancets (ONETOUCH DELICA PLUS [id number]) MISC USE 1 EACH TWICE DAILY   levothyroxine (SYNTHROID) 150  MCG tablet Take 75-150 mcg by mouth See admin instructions. Take 150 mcg daily except Sundays take 75 mcg   lisinopril-hydrochlorothiazide (ZESTORETIC) 10-12.5 MG tablet TAKE 1 TABLET BY MOUTH DAILY   metFORMIN (GLUCOPHAGE-XR) 500 MG 24 hr tablet TAKE 2 TABLETS(1000 MG) BY MOUTH TWICE DAILY   oxyCODONE (OXY IR/ROXICODONE) 5 MG immediate release tablet Take 1-2 tablets (5-10 mg total) by mouth every 4 (four) hours as needed for moderate pain (pain score 4-6).   Polyvinyl Alcohol-Povidone (REFRESH OP) Place 1 drop into both eyes daily.   pregabalin  (LYRICA) 50 MG capsule Take 1 capsule (50 mg total) by mouth 4 (four) times daily. (Patient taking differently: Take 100 mg by mouth 4 (four) times daily. 2 in am; 1 at 1 pm; 1 at HS)   senna (SENOKOT) 8.6 MG TABS tablet Take 1 tablet (8.6 mg total) by mouth daily as needed for mild constipation.   traMADol (ULTRAM) 50 MG tablet Take 1 tablet (50 mg total) by mouth every 6 (six) hours as needed.       02/20/2022    9:13 AM 12/06/2021   10:12 AM 06/20/2021    9:41 AM 02/15/2021    9:19 AM  GAD 7 : Generalized Anxiety Score  Nervous, Anxious, on Edge 1 0 0 0  Control/stop worrying 0 0 0 0  Worry too much - different things 0 0 0 0  Trouble relaxing 0 0 0 0  Restless 0 0 0 0  Easily annoyed or irritable 0 0 0 0  Afraid - awful might happen 0 0 0 0  Total GAD 7 Score 1 0 0 0  Anxiety Difficulty Not difficult at all Not difficult at all Not difficult at all        02/20/2022    9:13 AM 12/06/2021   10:12 AM 06/20/2021    9:40 AM  Depression screen PHQ 2/9  Decreased Interest 0 0 0  Down, Depressed, Hopeless 0 0 0  PHQ - 2 Score 0 0 0  Altered sleeping 1 0 1  Tired, decreased energy 1 0 1  Change in appetite 0 0 0  Feeling bad or failure about yourself  0 0 0  Trouble concentrating 0 0 0  Moving slowly or fidgety/restless 0 0 0  Suicidal thoughts 0 0 0  PHQ-9 Score 2 0 2  Difficult doing work/chores Not difficult at all Not difficult at all Not difficult at all    BP Readings from Last 3 Encounters:  02/20/22 112/62  01/05/22 (!) 162/58  12/21/21 130/72    Physical Exam Vitals and nursing note reviewed.  Constitutional:      General: She is not in acute distress.    Appearance: She is well-developed.  HENT:     Head: Normocephalic and atraumatic.     Right Ear: Tympanic membrane and ear canal normal.     Left Ear: Tympanic membrane and ear canal normal.     Nose:     Right Sinus: No maxillary sinus tenderness.     Left Sinus: No maxillary sinus tenderness.  Eyes:      General: No scleral icterus.       Right eye: No discharge.        Left eye: No discharge.     Conjunctiva/sclera: Conjunctivae normal.  Neck:     Thyroid: No thyromegaly.     Vascular: No carotid bruit.  Cardiovascular:     Rate and Rhythm: Normal rate and regular rhythm.  Pulses: Normal pulses.     Heart sounds: Normal heart sounds.  Pulmonary:     Effort: Pulmonary effort is normal. No respiratory distress.     Breath sounds: No wheezing.  Chest:  Breasts:    Right: No mass, nipple discharge, skin change or tenderness.     Left: No mass, nipple discharge, skin change or tenderness.  Abdominal:     General: Bowel sounds are normal.     Palpations: Abdomen is soft.     Tenderness: There is no abdominal tenderness.  Musculoskeletal:     Cervical back: Normal range of motion. No erythema.     Right lower leg: No edema.     Left lower leg: No edema.     Comments: Right knee surgical scar healed - still slightly swollen and tender  Lymphadenopathy:     Cervical: No cervical adenopathy.  Skin:    General: Skin is warm and dry.     Capillary Refill: Capillary refill takes less than 2 seconds.     Findings: No rash.  Neurological:     General: No focal deficit present.     Mental Status: She is alert and oriented to person, place, and time.     Cranial Nerves: No cranial nerve deficit.     Sensory: No sensory deficit.     Deep Tendon Reflexes: Reflexes are normal and symmetric.  Psychiatric:        Attention and Perception: Attention normal.        Mood and Affect: Mood normal.    Diabetic Foot Exam - Simple   Simple Foot Form Diabetic Foot exam was performed with the following findings: Yes 02/20/2022  9:34 AM  Visual Inspection No deformities, no ulcerations, no other skin breakdown bilaterally: Yes Sensation Testing Intact to touch and monofilament testing bilaterally: Yes Pulse Check Posterior Tibialis and Dorsalis pulse intact bilaterally: Yes Comments       Wt Readings from Last 3 Encounters:  02/20/22 233 lb 6.4 oz (105.9 kg)  01/04/22 240 lb (108.9 kg)  12/26/21 238 lb (108 kg)    BP 112/62   Pulse 98   Ht 5' 8"  (1.727 m)   Wt 233 lb 6.4 oz (105.9 kg)   SpO2 94%   BMI 35.49 kg/m   Assessment and Plan: 1. Annual physical exam Normal exam; she has lost some weight despite being laid up with her knee surgery She declines DEXA  2. Essential (primary) hypertension Clinically stable exam with well controlled BP. Tolerating medications without side effects at this time. Pt to continue current regimen and low sodium diet; benefits of regular exercise as able discussed. - CBC with Differential/Platelet  3. Acquired hypothyroidism Supplemented and followed by Endo  4. Type II diabetes mellitus with complication (HCC) Clinically stable by exam and report without s/s of hypoglycemia. DM complicated by hypertension and dyslipidemia. Tolerating medications well without side effects or other concerns. - Comprehensive metabolic panel - Hemoglobin A1c - Microalbumin / creatinine urine ratio  5. Hyperlipidemia associated with type 2 diabetes mellitus ([place]) Tolerating statin medication without side effects at this time LDL is at goal of < 70 on current dose Continue same therapy without change at this time. - Lipid panel  6. Chronic kidney disease, stage 3a ([place]) Continue to monitor - Comprehensive metabolic panel  7. Encounter for screening mammogram for breast cancer Schedule at [facility] - MM 3D SCREEN BREAST BILATERAL  8. Colon cancer screening - Fecal occult blood, imunochemical   Partially  dictated using Editor, commissioning. Any errors are unintentional.  Halina Maidens, MD [facility]  02/20/2022

## 2022-02-21 DIAGNOSIS — Z96651 Presence of right artificial knee joint: Secondary | ICD-10-CM | POA: Diagnosis not present

## 2022-02-21 LAB — CBC WITH DIFFERENTIAL/PLATELET
Basophils Absolute: 0.1 10*3/uL (ref 0.0–0.2)
Basos: 1 %
EOS (ABSOLUTE): 0.3 10*3/uL (ref 0.0–0.4)
Eos: 2 %
Hematocrit: 31.8 % — ABNORMAL LOW (ref 34.0–46.6)
Hemoglobin: 9.9 g/dL — ABNORMAL LOW (ref 11.1–15.9)
Immature Grans (Abs): 0 10*3/uL (ref 0.0–0.1)
Immature Granulocytes: 0 %
Lymphocytes Absolute: 2 10*3/uL (ref 0.7–3.1)
Lymphs: 15 %
MCH: 25 pg — ABNORMAL LOW (ref 26.6–33.0)
MCHC: 31.1 g/dL — ABNORMAL LOW (ref 31.5–35.7)
MCV: 80 fL (ref 79–97)
Monocytes Absolute: 0.8 10*3/uL (ref 0.1–0.9)
Monocytes: 6 %
Neutrophils Absolute: 10.2 10*3/uL — ABNORMAL HIGH (ref 1.4–7.0)
Neutrophils: 76 %
Platelets: 349 10*3/uL (ref 150–450)
RBC: 3.96 x10E6/uL (ref 3.77–5.28)
RDW: 13.4 % (ref 11.7–15.4)
WBC: 13.4 10*3/uL — ABNORMAL HIGH (ref 3.4–10.8)

## 2022-02-21 LAB — MICROALBUMIN / CREATININE URINE RATIO
Creatinine, Urine: 277.1 mg/dL
Microalb/Creat Ratio: 97 mg/g creat — ABNORMAL HIGH (ref 0–29)
Microalbumin, Urine: 269.7 ug/mL

## 2022-02-21 LAB — LIPID PANEL
Chol/HDL Ratio: 2.9 ratio (ref 0.0–4.4)
Cholesterol, Total: 151 mg/dL (ref 100–199)
HDL: 52 mg/dL (ref >39)
LDL Chol Calc (NIH): 72 mg/dL (ref 0–99)
Triglycerides: 156 mg/dL — ABNORMAL HIGH (ref 0–149)
VLDL Cholesterol Cal: 27 mg/dL (ref 5–40)

## 2022-02-21 LAB — COMPREHENSIVE METABOLIC PANEL
ALT: 17 IU/L (ref 0–32)
AST: 16 IU/L (ref 0–40)
Albumin/Globulin Ratio: 1.4 (ref 1.2–2.2)
Albumin: 4.3 g/dL (ref 3.8–4.8)
Alkaline Phosphatase: 87 IU/L (ref 44–121)
BUN/Creatinine Ratio: 15 (ref 12–28)
BUN: 16 mg/dL (ref 8–27)
Bilirubin Total: 0.3 mg/dL (ref 0.0–1.2)
CO2: 24 mmol/L (ref 20–29)
Calcium: 9.9 mg/dL (ref 8.7–10.3)
Chloride: 100 mmol/L (ref 96–106)
Creatinine, Ser: 1.06 mg/dL — ABNORMAL HIGH (ref 0.57–1.00)
Globulin, Total: 3 g/dL (ref 1.5–4.5)
Glucose: 130 mg/dL — ABNORMAL HIGH (ref 70–99)
Potassium: 5.1 mmol/L (ref 3.5–5.2)
Sodium: 141 mmol/L (ref 134–144)
Total Protein: 7.3 g/dL (ref 6.0–8.5)
eGFR: 55 mL/min/{1.73_m2} — ABNORMAL LOW (ref >59)

## 2022-02-21 LAB — HEMOGLOBIN A1C
Est. average glucose Bld gHb Est-mCnc: 111 mg/dL
Hgb A1c MFr Bld: 5.5 % (ref 4.8–5.6)

## 2022-02-23 ENCOUNTER — Other Ambulatory Visit: Payer: Self-pay | Admitting: Internal Medicine

## 2022-02-23 MED ORDER — TRAMADOL HCL 50 MG PO TABS: 50.0000 mg | ORAL_TABLET | Freq: Four times a day (QID) | ORAL | 0 refills | Status: DC | PRN

## 2022-02-23 NOTE — Telephone Encounter (Signed)
Please review. Last office visit 02/20/2022.  KP

## 2022-02-23 NOTE — Telephone Encounter (Signed)
Medication Refill - Medication: traMADol (ULTRAM) 50 MG tablet  Has the patient contacted their pharmacy? No. No, more refills.   (Agent: If no, request that the patient contact the pharmacy for the refill. If patient does not wish to contact the pharmacy document the reason why and proceed with request.)   Preferred Pharmacy (with phone number or street name):  [facility] [place], [place] - [address]  [place] [address]  Phone: [phone] Fax: [phone]  Hours: Not open 24 hours   Has the patient been seen for an appointment in the last year OR does the patient have an upcoming appointment? Yes.    Agent: Please be advised that RX refills may take up to 3 business days. We ask that you follow-up with your pharmacy.

## 2022-02-23 NOTE — Telephone Encounter (Signed)
Requested medication (s) are due for refill today - yes  Requested medication (s) are on the active medication list -yes  Future visit scheduled -yes  Last refill: 01/05/22 #30  Notes to clinic: non delegated Rx  Requested Prescriptions  Pending Prescriptions Disp Refills   traMADol (ULTRAM) 50 MG tablet 30 tablet 0    Sig: Take 1 tablet (50 mg total) by mouth every 6 (six) hours as needed.     Not Delegated - Analgesics:  Opioid Agonists Failed - 02/23/2022  1:37 PM      Failed - This refill cannot be delegated      Failed - Urine Drug Screen completed in last 360 days      Passed - Valid encounter within last 3 months    Recent Outpatient Visits           3 days ago Annual physical exam   [place] [facility] at [facility], Jesse Sans, MD   2 months ago Essential (primary) hypertension   [place] [facility] at [facility], Jesse Sans, MD   8 months ago Type 2 diabetes mellitus with stage 3a chronic kidney disease, without long-term current use of insulin ([place])   [place] [facility] at [facility], Jesse Sans, MD   1 year ago Annual physical exam   [place] [facility] at [facility], Jesse Sans, MD   1 year ago Type II diabetes mellitus with complication [facility])   [place] [facility] at [facility], Jesse Sans, MD       Future Appointments             In 3 months Army Melia Jesse Sans, MD [facility] Primary Care and Sports Medicine at [facility], [facility]   In 1 year Glean Hess, MD [facility] Primary Care and Sports Medicine at [facility], [facility]               Requested Prescriptions  Pending Prescriptions Disp Refills   traMADol (ULTRAM) 50 MG tablet 30 tablet 0    Sig: Take 1 tablet (50 mg total) by mouth every 6 (six) hours as needed.     Not Delegated - Analgesics:  Opioid  Agonists Failed - 02/23/2022  1:37 PM      Failed - This refill cannot be delegated      Failed - Urine Drug Screen completed in last 360 days      Passed - Valid encounter within last 3 months    Recent Outpatient Visits           3 days ago Annual physical exam   [place] [facility] at [facility], Jesse Sans, MD   2 months ago Essential (primary) hypertension   [place] [facility] at [facility], Jesse Sans, MD   8 months ago Type 2 diabetes mellitus with stage 3a chronic kidney disease, without long-term current use of insulin ([place])   [place] [facility] at [facility], Jesse Sans, MD   1 year ago Annual physical exam   [place] [facility] at [facility], Jesse Sans, MD   1 year ago Type II diabetes mellitus with complication [facility])   [place] [facility] at [facility], Jesse Sans, MD  Future Appointments             In 3 months Army Melia, Jesse Sans, MD [facility] Primary Care and Sports Medicine at [facility], [facility]   In 1 year Army Melia, Jesse Sans, MD [facility] Primary Care and Sports Medicine at [facility], [facility]

## 2022-02-26 ENCOUNTER — Telehealth: Payer: Self-pay | Admitting: Internal Medicine

## 2022-02-26 NOTE — Telephone Encounter (Signed)
Pt picks up her Rx traMADol (ULTRAM) 50 MG tablet and it was for only 30 tabs.  Pt states she usually gets 180 tabs. Needs new Rx.  Pt states she takes 6/day.  Two tabs every six hours  [place]

## 2022-02-27 ENCOUNTER — Other Ambulatory Visit: Payer: Self-pay | Admitting: Internal Medicine

## 2022-02-27 DIAGNOSIS — M170 Bilateral primary osteoarthritis of knee: Secondary | ICD-10-CM

## 2022-02-27 DIAGNOSIS — Z96651 Presence of right artificial knee joint: Secondary | ICD-10-CM | POA: Diagnosis not present

## 2022-02-27 MED ORDER — TRAMADOL HCL 50 MG PO TABS: 50.0000 mg | ORAL_TABLET | Freq: Four times a day (QID) | ORAL | 0 refills | Status: DC | PRN

## 2022-02-27 NOTE — Telephone Encounter (Signed)
Called patient and left VM informing her of this information. Asked to call back to clarify that she is taking tramadol for knees, and that she is okay with reducing down to once daily for the tramadol because this is what the surgeon said to do on her discharge instructions from her knee surgery. Will send patient a my chart message also to let her know this information.  - Wen Munford

## 2022-02-28 ENCOUNTER — Telehealth: Payer: Self-pay

## 2022-02-28 DIAGNOSIS — Z1211 Encounter for screening for malignant neoplasm of colon: Secondary | ICD-10-CM | POA: Diagnosis not present

## 2022-02-28 NOTE — Telephone Encounter (Signed)
Called pt as a reminder to turn in fit test and call to schedule mammogram pt verbalized understanding. Pt mailed in fit test today.  KP

## 2022-03-01 ENCOUNTER — Ambulatory Visit
Admission: RE | Admit: 2022-03-01 | Discharge: 2022-03-01 | Disposition: A | Discharge status: Home / Self Care | Payer: Medicare Other | Source: Ambulatory Visit | Attending: "Endocrinology | Admitting: "Endocrinology

## 2022-03-01 DIAGNOSIS — I700 Atherosclerosis of aorta: Secondary | ICD-10-CM | POA: Diagnosis not present

## 2022-03-01 DIAGNOSIS — E279 Disorder of adrenal gland, unspecified: Secondary | ICD-10-CM | POA: Diagnosis not present

## 2022-03-01 DIAGNOSIS — E278 Other specified disorders of adrenal gland: Secondary | ICD-10-CM | POA: Insufficient documentation

## 2022-03-01 DIAGNOSIS — D3500 Benign neoplasm of unspecified adrenal gland: Secondary | ICD-10-CM | POA: Diagnosis not present

## 2022-03-01 DIAGNOSIS — Z853 Personal history of malignant neoplasm of breast: Secondary | ICD-10-CM | POA: Diagnosis not present

## 2022-03-01 MED ORDER — GADOBUTROL 1 MMOL/ML IV SOLN
10.0000 mL | Freq: Once | INTRAVENOUS | Status: AC | PRN
  Administered 2022-03-01: 10 mL via INTRAVENOUS

## 2022-03-03 LAB — FECAL OCCULT BLOOD, IMMUNOCHEMICAL: Fecal Occult Bld: NEGATIVE

## 2022-03-06 DIAGNOSIS — E278 Other specified disorders of adrenal gland: Secondary | ICD-10-CM | POA: Diagnosis not present

## 2022-03-14 ENCOUNTER — Ambulatory Visit: Payer: Medicare Other

## 2022-03-20 DIAGNOSIS — E278 Other specified disorders of adrenal gland: Secondary | ICD-10-CM | POA: Diagnosis not present

## 2022-03-23 DIAGNOSIS — M4807 Spinal stenosis, lumbosacral region: Secondary | ICD-10-CM | POA: Diagnosis not present

## 2022-03-23 DIAGNOSIS — M1711 Unilateral primary osteoarthritis, right knee: Secondary | ICD-10-CM | POA: Diagnosis not present

## 2022-03-23 DIAGNOSIS — Z96651 Presence of right artificial knee joint: Secondary | ICD-10-CM | POA: Diagnosis not present

## 2022-03-26 ENCOUNTER — Other Ambulatory Visit (HOSPITAL_COMMUNITY): Payer: Self-pay | Admitting: Orthopedic Surgery

## 2022-03-26 ENCOUNTER — Other Ambulatory Visit: Payer: Self-pay | Admitting: Orthopedic Surgery

## 2022-03-26 DIAGNOSIS — M4807 Spinal stenosis, lumbosacral region: Secondary | ICD-10-CM

## 2022-04-01 ENCOUNTER — Other Ambulatory Visit: Payer: Self-pay | Admitting: Internal Medicine

## 2022-04-01 DIAGNOSIS — M170 Bilateral primary osteoarthritis of knee: Secondary | ICD-10-CM

## 2022-04-01 MED ORDER — TRAMADOL HCL 50 MG PO TABS: 100.0000 mg | ORAL_TABLET | Freq: Four times a day (QID) | ORAL | 0 refills | Status: DC | PRN

## 2022-04-04 ENCOUNTER — Ambulatory Visit
Admission: RE | Admit: 2022-04-04 | Discharge: 2022-04-04 | Disposition: A | Discharge status: Home / Self Care | Payer: Medicare Other | Source: Ambulatory Visit | Attending: Orthopedic Surgery | Admitting: Orthopedic Surgery

## 2022-04-04 DIAGNOSIS — M48061 Spinal stenosis, lumbar region without neurogenic claudication: Secondary | ICD-10-CM | POA: Diagnosis not present

## 2022-04-04 DIAGNOSIS — M4807 Spinal stenosis, lumbosacral region: Secondary | ICD-10-CM | POA: Insufficient documentation

## 2022-04-05 ENCOUNTER — Ambulatory Visit
Admission: RE | Admit: 2022-04-05 | Discharge: 2022-04-05 | Disposition: A | Discharge status: Home / Self Care | Payer: Medicare Other | Source: Ambulatory Visit | Attending: Internal Medicine | Admitting: Internal Medicine

## 2022-04-05 DIAGNOSIS — Z1231 Encounter for screening mammogram for malignant neoplasm of breast: Secondary | ICD-10-CM | POA: Insufficient documentation

## 2022-04-11 ENCOUNTER — Other Ambulatory Visit: Payer: Self-pay | Admitting: Internal Medicine

## 2022-04-11 DIAGNOSIS — E1142 Type 2 diabetes mellitus with diabetic polyneuropathy: Secondary | ICD-10-CM

## 2022-04-11 NOTE — Telephone Encounter (Signed)
Medication Refill - Medication: pregabalin (LYRICA) 50 MG capsule  Has the patient contacted their pharmacy? No. Patient wants it sent to a new pharmacy.  Preferred Pharmacy (with phone number or street name):  [place], [place] Phone:  [phone]  Fax:  [phone]     Has the patient been seen for an appointment in the last year OR does the patient have an upcoming appointment? Yes.    Agent: Please be advised that RX refills may take up to 3 business days. We ask that you follow-up with your pharmacy.

## 2022-04-12 NOTE — Telephone Encounter (Signed)
Requested medication (s) are due for refill today - yes  Requested medication (s) are on the active medication list -yes  Future visit scheduled -yes  Last refill: 10/16/21 #360 1RF  Notes to clinic: non delegated Rx  Requested Prescriptions  Pending Prescriptions Disp Refills   pregabalin (LYRICA) 50 MG capsule 360 capsule 1    Sig: Take 1 capsule (50 mg total) by mouth 4 (four) times daily.     Not Delegated - Neurology:  Anticonvulsants - Controlled - pregabalin Failed - 04/11/2022  2:18 PM      Failed - This refill cannot be delegated      Failed - Cr in normal range and within 360 days    Creatinine, Ser  Date Value Ref Range Status  02/20/2022 1.06 (H) 0.57 - 1.00 mg/dL Final         Passed - Completed PHQ-2 or PHQ-9 in the last 360 days      Passed - Valid encounter within last 12 months    Recent Outpatient Visits           1 month ago Annual physical exam   [place] [facility] at [facility], Jesse Sans, MD   4 months ago Essential (primary) hypertension   [place] [facility] at [facility], Jesse Sans, MD   9 months ago Type 2 diabetes mellitus with stage 3a chronic kidney disease, without long-term current use of insulin ([place])   [place] [facility] at [facility], Jesse Sans, MD   1 year ago Annual physical exam   [place] [facility] at [facility], Jesse Sans, MD   1 year ago Type II diabetes mellitus with complication [facility])   [place] [facility] at [facility], Jesse Sans, MD       Future Appointments             In 2 months Army Melia Jesse Sans, MD [facility] Primary Care and Sports Medicine at [facility], [facility]   In 10 months Glean Hess, MD [facility] Health Primary Care and Sports Medicine at [facility], [facility]               Requested Prescriptions  Pending  Prescriptions Disp Refills   pregabalin (LYRICA) 50 MG capsule 360 capsule 1    Sig: Take 1 capsule (50 mg total) by mouth 4 (four) times daily.     Not Delegated - Neurology:  Anticonvulsants - Controlled - pregabalin Failed - 04/11/2022  2:18 PM      Failed - This refill cannot be delegated      Failed - Cr in normal range and within 360 days    Creatinine, Ser  Date Value Ref Range Status  02/20/2022 1.06 (H) 0.57 - 1.00 mg/dL Final         Passed - Completed PHQ-2 or PHQ-9 in the last 360 days      Passed - Valid encounter within last 12 months    Recent Outpatient Visits           1 month ago Annual physical exam   [place] [facility] at [facility], Jesse Sans, MD   4 months ago Essential (primary) hypertension   [place] [facility] at [facility], Jesse Sans, MD   9 months ago Type 2 diabetes mellitus  with stage 3a chronic kidney disease, without long-term current use of insulin ([place])   [place] [facility] at [facility], Jesse Sans, MD   1 year ago Annual physical exam   [place] [facility] at [facility], Jesse Sans, MD   1 year ago Type II diabetes mellitus with complication [facility])   [place] [facility] at [facility], Jesse Sans, MD       Future Appointments             In 2 months Army Melia, Jesse Sans, MD [facility] Primary Care and Sports Medicine at [facility], [facility]   In 10 months Army Melia, Jesse Sans, MD [place] [facility] at [facility], [facility]

## 2022-04-12 NOTE — Telephone Encounter (Signed)
Pt following up on refill request for  pregabalin (LYRICA) 50 MG capsule   [place], [place]  *please note change of pharmacy from her last refill.

## 2022-04-13 ENCOUNTER — Other Ambulatory Visit: Payer: Self-pay | Admitting: Internal Medicine

## 2022-04-13 DIAGNOSIS — E1142 Type 2 diabetes mellitus with diabetic polyneuropathy: Secondary | ICD-10-CM

## 2022-04-13 MED ORDER — PREGABALIN 50 MG PO CAPS: 100.0000 mg | ORAL_CAPSULE | Freq: Four times a day (QID) | ORAL | 2 refills | Status: DC

## 2022-04-17 DIAGNOSIS — M48062 Spinal stenosis, lumbar region with neurogenic claudication: Secondary | ICD-10-CM | POA: Diagnosis not present

## 2022-04-17 DIAGNOSIS — M5416 Radiculopathy, lumbar region: Secondary | ICD-10-CM | POA: Diagnosis not present

## 2022-05-14 ENCOUNTER — Other Ambulatory Visit: Payer: Self-pay | Admitting: Internal Medicine

## 2022-05-14 DIAGNOSIS — M170 Bilateral primary osteoarthritis of knee: Secondary | ICD-10-CM

## 2022-05-14 NOTE — Telephone Encounter (Signed)
Medication Refill - Medication: traMADol (ULTRAM) 50 MG tablet [[id number]]   Pt reports that she out of medication. She took her last 2 doses this morning.  Has the patient contacted their pharmacy? No. (Agent: If no, request that the patient contact the pharmacy for the refill. If patient does not wish to contact the pharmacy document the reason why and proceed with request.) (Agent: If yes, when and what did the pharmacy advise?)  Preferred Pharmacy (with phone number or street name):  [facility]ORE [place], [place] - [address] [place], [place] [address] Phone: [phone]  Fax: [phone]   Has the patient been seen for an appointment in the last year OR does the patient have an upcoming appointment? Yes.    Agent: Please be advised that RX refills may take up to 3 business days. We ask that you follow-up with your pharmacy.

## 2022-05-15 ENCOUNTER — Ambulatory Visit: Payer: Self-pay | Admitting: *Deleted

## 2022-05-15 MED ORDER — TRAMADOL HCL 50 MG PO TABS: 100.0000 mg | ORAL_TABLET | Freq: Four times a day (QID) | ORAL | 2 refills | Status: DC | PRN

## 2022-05-15 NOTE — Telephone Encounter (Signed)
Requested medication (s) are due for refill today - provider review   Requested medication (s) are on the active medication list - yes  Future visit scheduled -yes  Last refill: 04/01/22 #240  Notes to clinic: non delegated Rx  Requested Prescriptions  Pending Prescriptions Disp Refills   traMADol (ULTRAM) 50 MG tablet 240 tablet 0    Sig: Take 2 tablets (100 mg total) by mouth every 6 (six) hours as needed.     Not Delegated - Analgesics:  Opioid Agonists Failed - 05/14/2022 11:50 AM      Failed - This refill cannot be delegated      Failed - Urine Drug Screen completed in last 360 days      Passed - Valid encounter within last 3 months    Recent Outpatient Visits           2 months ago Annual physical exam   [place] [facility] at [facility], Jesse Sans, MD   5 months ago Essential (primary) hypertension   [place] [facility] at [facility], Jesse Sans, MD   10 months ago Type 2 diabetes mellitus with stage 3a chronic kidney disease, without long-term current use of insulin ([place])   [place] [facility] and [facility] at [facility], Jesse Sans, MD   1 year ago Annual physical exam   [place] [facility] at [facility], Jesse Sans, MD   1 year ago Type II diabetes mellitus with complication [facility])   [place] Primary Care and Sports Medicine at [facility], Jesse Sans, MD       Future Appointments             In 1 month Army Melia Jesse Sans, MD [facility] Primary Care and Sports Medicine at [facility], [facility]   In 9 months Glean Hess, MD [facility] Primary Care and Sports Medicine at [facility], [facility]               Requested Prescriptions  Pending Prescriptions Disp Refills   traMADol (ULTRAM) 50 MG tablet 240 tablet 0    Sig: Take 2 tablets (100 mg total) by mouth every 6 (six) hours as needed.     Not  Delegated - Analgesics:  Opioid Agonists Failed - 05/14/2022 11:50 AM      Failed - This refill cannot be delegated      Failed - Urine Drug Screen completed in last 360 days      Passed - Valid encounter within last 3 months    Recent Outpatient Visits           2 months ago Annual physical exam   [place] [facility] at [facility], Jesse Sans, MD   5 months ago Essential (primary) hypertension   [place] [facility] at [facility], Jesse Sans, MD   10 months ago Type 2 diabetes mellitus with stage 3a chronic kidney disease, without long-term current use of insulin ([place])    Primary Care and Sports Medicine at [facility], Jesse Sans, MD   1 year ago Annual physical exam    Primary Care and Sports Medicine at [facility], Jesse Sans, MD   1 year ago Type II diabetes mellitus with complication [facility])   [facility] at [facility], Jesse Sans,  MD       Future Appointments             In 1 month Army Melia, Jesse Sans, MD [facility] Primary Care and Sports Medicine at [facility]   In 9 months Army Melia, Jesse Sans, MD [facility] Primary Care and Sports Medicine at [facility], [facility]

## 2022-05-15 NOTE — Telephone Encounter (Signed)
Medication refill approved and sent in>  KP

## 2022-05-15 NOTE — Telephone Encounter (Signed)
Patient states her pain in legs/feet is chronic- she is upset that she has to wait 2-3 days for medication refill- she states she will be better with medication and does not need to be triaged for her pain. Patient also state sshe has diarrhea- but she states that will work itself out- does not want to be triaged for that - advised to call back if it gets worse. Reason for Disposition  Caller requesting a CONTROLLED substance prescription refill (e.g., narcotics, ADHD medicines)  Answer Assessment - Initial Assessment Questions 1. DRUG NAME: "What medicine do you need to have refilled?"      traMADol (ULTRAM) 50 MG tablet 2. REFILLS REMAINING: "How many refills are remaining?" (Note: The label on the medicine or pill bottle will show how many refills are remaining. If there are no refills remaining, then a renewal may be needed.)     none 3. EXPIRATION DATE: "What is the expiration date?" (Note: The label states when the prescription will expire, and thus can no longer be refilled.)     na 4. PRESCRIBING HCP: "Who prescribed it?" Reason: If prescribed by specialist, call should be referred to that group.     PCP 5. SYMPTOMS: "Do you have any symptoms?"     Leg/foot pain  Refill is pending for provider review  Protocols used: Medication Refill and Renewal Call-A-AH

## 2022-05-15 NOTE — Telephone Encounter (Signed)
Please review.  KP

## 2022-05-28 DIAGNOSIS — D1809 Hemangioma of other sites: Secondary | ICD-10-CM | POA: Diagnosis not present

## 2022-05-28 DIAGNOSIS — I10 Essential (primary) hypertension: Secondary | ICD-10-CM | POA: Diagnosis not present

## 2022-05-28 DIAGNOSIS — E279 Disorder of adrenal gland, unspecified: Secondary | ICD-10-CM | POA: Diagnosis not present

## 2022-05-28 DIAGNOSIS — E278 Other specified disorders of adrenal gland: Secondary | ICD-10-CM | POA: Diagnosis not present

## 2022-05-28 HISTORY — PX: ADRENALECTOMY: SHX876

## 2022-05-29 ENCOUNTER — Encounter: Payer: Self-pay | Admitting: Internal Medicine

## 2022-05-29 LAB — BASIC METABOLIC PANEL
BUN: 17 (ref 4–21)
CO2: 26 — AB (ref 13–22)
Chloride: 105 (ref 99–108)
Creatinine: 0.9 (ref 0.5–1.1)
Glucose: 145
Potassium: 4.4 mEq/L (ref 3.5–5.1)
Sodium: 139 (ref 137–147)

## 2022-05-29 LAB — COMPREHENSIVE METABOLIC PANEL
Calcium: 9 (ref 8.7–10.7)
eGFR: 67

## 2022-05-29 LAB — CBC AND DIFFERENTIAL
HCT: 29 — AB (ref 36–46)
Hemoglobin: 9.3 — AB (ref 12.0–16.0)
Platelets: 271 10*3/uL (ref 150–400)
WBC: 14.5

## 2022-05-29 LAB — CBC: RBC: 3.7 — AB (ref 3.87–5.11)

## 2022-05-31 ENCOUNTER — Telehealth: Payer: Self-pay | Admitting: *Deleted

## 2022-05-31 NOTE — Patient Outreach (Signed)
  Care Coordination [facility] Note Transition Care Management Unsuccessful Follow-up Telephone Call  Date of discharge and from where:  [facility] [phone]  Attempts:  1st Attempt  Reason for unsuccessful TCM follow-up call:  Left voice message  [facility] [phone]

## 2022-06-13 ENCOUNTER — Other Ambulatory Visit: Payer: Self-pay | Admitting: Internal Medicine

## 2022-06-13 DIAGNOSIS — E1122 Type 2 diabetes mellitus with diabetic chronic kidney disease: Secondary | ICD-10-CM

## 2022-06-13 NOTE — Telephone Encounter (Signed)
Copied from [place] [id number]. Topic: General - Other >> Jun 13, 2022  2:52 PM Everette C wrote: Reason for CRM: Medication Refill - Medication: metFORMIN (GLUCOPHAGE-XR) 500 MG 24 hr tablet [[id number]]  Has the patient contacted their pharmacy? No. (Agent: If no, request that the patient contact the pharmacy for the refill. If patient does not wish to contact the pharmacy document the reason why and proceed with request.) (Agent: If yes, when and what did the pharmacy advise?)  Preferred Pharmacy (with phone number or street name): [facility] STORE [place], [place] - [address] [place] [address] Phone: [phone] Fax: [phone] Hours: Not open 24 hours   Has the patient been seen for an appointment in the last year OR does the patient have an upcoming appointment? Yes.    Agent: Please be advised that RX refills may take up to 3 business days. We ask that you follow-up with your pharmacy.

## 2022-06-15 MED ORDER — METFORMIN HCL ER 500 MG PO TB24: ORAL_TABLET | ORAL | 0 refills | Status: DC

## 2022-06-15 NOTE — Telephone Encounter (Signed)
Requested Prescriptions  Pending Prescriptions Disp Refills   metFORMIN (GLUCOPHAGE-XR) 500 MG 24 hr tablet 360 tablet 0    Sig: TAKE 2 TABLETS(1000 MG) BY MOUTH TWICE DAILY     Endocrinology:  Diabetes - Biguanides Failed - 06/13/2022  5:11 PM      Failed - Cr in normal range and within 360 days    Creatinine, Ser  Date Value Ref Range Status  02/20/2022 1.06 (H) 0.57 - 1.00 mg/dL Final         Failed - eGFR in normal range and within 360 days    GFR calc Af Amer  Date Value Ref Range Status  05/09/2020 65 >59 mL/min/1.73 Final    Comment:    **In accordance with recommendations from the NKF-ASN Task force,**   [facility] is in the process of updating its eGFR calculation to the   2021 CKD-EPI creatinine equation that estimates kidney function   without a race variable.    GFR, Estimated  Date Value Ref Range Status  01/05/2022 56 (L) >60 mL/min Final    Comment:    (NOTE) Calculated using the CKD-EPI Creatinine Equation (2021)    eGFR  Date Value Ref Range Status  02/20/2022 55 (L) >59 mL/min/1.73 Final         Failed - B12 Level in normal range and within 720 days    Vitamin B-12  Date Value Ref Range Status  10/04/2015 246 180 - 914 pg/mL Final    Comment:    (NOTE) This assay is not validated for testing neonatal or myeloproliferative syndrome specimens for Vitamin B12 levels. Performed at [place] is between 0 and 7.9 and within 180 days    Hgb A1c MFr Bld  Date Value Ref Range Status  02/20/2022 5.5 4.8 - 5.6 % Final    Comment:             Prediabetes: 5.7 - 6.4          Diabetes: >6.4          Glycemic control for adults with diabetes: <7.0          Passed - Valid encounter within last 6 months    Recent Outpatient Visits           3 months ago Annual physical exam   [place] [facility] at [facility], Jesse Sans, MD   6 months ago Essential (primary) hypertension   [facility] at [facility], Jesse Sans, MD   12 months ago Type 2 diabetes mellitus with stage 3a chronic kidney disease, without long-term current use of insulin ([place])   [place] [facility] at [facility], Jesse Sans, MD   1 year ago Annual physical exam   [place] [facility] and [facility] at [facility], Jesse Sans, MD   1 year ago Type II diabetes mellitus with complication [facility])   [place] [facility] at [facility], Jesse Sans, MD       Future Appointments             In 1 week Army Melia, Jesse Sans, MD [facility] Health Primary Care and Sports Medicine at [facility], [facility]   In 8 months Army Melia, Jesse Sans, MD [place] Primary Care and Sports Medicine at [facility], [facility]  Passed - CBC within normal limits and completed in the last 12 months    WBC  Date Value Ref Range Status  02/20/2022 13.4 (H) 3.4 - 10.8 x10E3/uL Final  01/05/2022 11.4 (H) 4.0 - 10.5 K/uL Final   RBC  Date Value Ref Range Status  02/20/2022 3.96 3.77 - 5.28 x10E6/uL Final  01/05/2022 3.60 (L) 3.87 - 5.11 MIL/uL Final   Hemoglobin  Date Value Ref Range Status  02/20/2022 9.9 (L) 11.1 - 15.9 g/dL Final   Hematocrit  Date Value Ref Range Status  02/20/2022 31.8 (L) 34.0 - 46.6 % Final   MCHC  Date Value Ref Range Status  02/20/2022 31.1 (L) 31.5 - 35.7 g/dL Final  01/05/2022 31.1 30.0 - 36.0 g/dL Final   [facility]  Date Value Ref Range Status  02/20/2022 25.0 (L) 26.6 - 33.0 pg Final  01/05/2022 26.4 26.0 - 34.0 pg Final   MCV  Date Value Ref Range Status  02/20/2022 80 79 - 97 fL Final   No results found for: "PLTCOUNTKUC", "LABPLAT", "POCPLA" RDW  Date Value Ref Range Status  02/20/2022 13.4 11.7 - 15.4 % Final

## 2022-06-22 ENCOUNTER — Ambulatory Visit (INDEPENDENT_AMBULATORY_CARE_PROVIDER_SITE_OTHER): Payer: Medicare Other | Admitting: Internal Medicine

## 2022-06-22 ENCOUNTER — Encounter: Payer: Self-pay | Admitting: Internal Medicine

## 2022-06-22 VITALS — BP 128/68 | HR 89 | Ht 68.0 in | Wt 237.0 lb

## 2022-06-22 DIAGNOSIS — F17201 Nicotine dependence, unspecified, in remission: Secondary | ICD-10-CM

## 2022-06-22 DIAGNOSIS — E118 Type 2 diabetes mellitus with unspecified complications: Secondary | ICD-10-CM | POA: Diagnosis not present

## 2022-06-22 LAB — POCT GLYCOSYLATED HEMOGLOBIN (HGB A1C): Hemoglobin A1C: 6 % — AB (ref 4.0–5.6)

## 2022-06-22 NOTE — Assessment & Plan Note (Addendum)
Clinically stable by exam and report without s/s of hypoglycemia. DM complicated by hypertension and dyslipidemia. Tolerating medications well. Recent BMP was normal A1C 6.0 today - continue metformin

## 2022-06-22 NOTE — Progress Notes (Signed)
Date:  06/22/2022   Name:  Kendra Walton   DOB:  21-Jun-1950   MRN:  [id number]   Chief Complaint: Diabetes  Diabetes She presents for her follow-up diabetic visit. She has type 2 diabetes mellitus. Her disease course has been stable. Pertinent negatives for hypoglycemia include no headaches or tremors. Pertinent negatives for diabetes include no chest pain, no fatigue, no polydipsia and no polyuria. Current diabetic treatment includes oral agent (monotherapy). She is compliant with treatment all of the time. There is no change in her home blood glucose trend. Her breakfast blood glucose is taken between 6-7 am. Her breakfast blood glucose range is generally 130-140 mg/dl. An ACE inhibitor/angiotensin II receptor blocker is being taken.   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  Lab Results  Component Value Date   NA 139 05/29/2022   K 4.4 05/29/2022   CO2 26 (A) 05/29/2022   GLUCOSE 130 (H) 02/20/2022   BUN 17 05/29/2022   CREATININE 0.9 05/29/2022   CALCIUM 9.0 05/29/2022   EGFR 67 05/29/2022   GFRNONAA 56 (L) 01/05/2022   Lab Results  Component Value Date   CHOL 151 02/20/2022   HDL 52 02/20/2022   LDLCALC 72 02/20/2022   TRIG 156 (H) 02/20/2022   CHOLHDL 2.9 02/20/2022   Lab Results  Component Value Date   TSH 3.08 02/05/2022   Lab Results  Component Value Date   HGBA1C 6.0 (A) 06/22/2022   Lab Results  Component Value Date   WBC 14.5 05/29/2022   HGB 9.3 (A) 05/29/2022   HCT 29 (A) 05/29/2022   MCV 80 02/20/2022   PLT 271 05/29/2022   Lab Results  Component Value Date   ALT 17 02/20/2022   AST 16 02/20/2022   ALKPHOS 87 02/20/2022   BILITOT 0.3 02/20/2022   No results found for: "25OHVITD2", "25OHVITD3", "VD25OH"   Review of Systems  Constitutional:  Negative for appetite change, fatigue, fever and unexpected weight change.  Eyes:  Negative for visual disturbance.  Respiratory:   Negative for cough, chest tightness and shortness of breath.   Cardiovascular:  Negative for chest pain.  Gastrointestinal:  Negative for abdominal pain.  Endocrine: Negative for polydipsia and polyuria.  Genitourinary:  Negative for dysuria and hematuria.  Musculoskeletal:  Positive for arthralgias (knee is recovering slowly).  Neurological:  Negative for tremors, numbness and headaches.  Psychiatric/Behavioral:  Negative for dysphoric mood.     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   Obesity (BMI 30-39.9) 07/09/2018   Hearing decreased 12/13/2015   Hyperlipidemia associated with type 2 diabetes mellitus ([place]) 09/14/2015   History of breast cancer in female 04/07/2015   Acquired hypothyroidism 10/04/2014   Diabetic peripheral neuropathy ([place]) 10/04/2014   Essential (primary) hypertension 10/04/2014   DM (diabetes mellitus), type 2 with neurological complications ([place]) 46/65/9935    Allergies  Allergen Reactions   Shellfish Allergy Hives and Itching    Past Surgical History:  Procedure Laterality Date   ADRENALECTOMY Right 05/28/2022   APPENDECTOMY     CATARACT EXTRACTION W/ INTRAOCULAR LENS  IMPLANT, BILATERAL  2010   [place]   CHOLECYSTECTOMY  2005  ECTOPIC PREGNANCY SURGERY     LUMBAR LAMINECTOMY/DECOMPRESSION MICRODISCECTOMY Right 11/08/2021   Procedure: RIGHT L5-S1 MICRODISCECTOMY;  Surgeon: Meade Maw, MD;  Location: [facility] ORS;  Service: Neurosurgery;  Laterality: Right;   MASTECTOMY, PARTIAL Right 2010   REDUCTION MAMMAPLASTY Left 2011   RIGHT/LEFT HEART CATH AND CORONARY ANGIOGRAPHY Bilateral 08/06/2018   Procedure: RIGHT/LEFT HEART CATH AND  CORONARY ANGIOGRAPHY;  Surgeon: Teodoro Spray, MD;  Location: [place] [facility];  Service: Cardiovascular;  Laterality: Bilateral;   TOTAL KNEE ARTHROPLASTY Right 01/04/2022   Procedure: TOTAL KNEE ARTHROPLASTY;  Surgeon: Hessie Knows, MD;  Location: [facility] ORS;  Service: Orthopedics;  Laterality: Right;   TRANSCATHETER AORTIC VALVE REPLACEMENT, TRANSAPICAL  08/18/2018   Procedure: TRANSCATHETER AORTIC VALVE REPLACEMENT; Location: [facility]; Surgeon: Sharmon Leyden, MD    Social History   Tobacco Use   Smoking status: Former    Packs/day: 1.00    Years: 40.00    Total pack years: 40.00    Types: Cigarettes    Quit date: 06/18/2012    Years since quitting: 10.0   Smokeless tobacco: Never  Vaping Use   Vaping Use: Never used  Substance Use Topics   Alcohol use: No    Alcohol/week: 0.0 standard drinks of alcohol   Drug use: No     Medication list has been reviewed and updated.  Current Meds  Medication Sig   atorvastatin (LIPITOR) 20 MG tablet Take 1 tablet (20 mg total) by mouth at bedtime.   docusate sodium (COLACE) 100 MG capsule Take 1 capsule (100 mg total) by mouth 2 (two) times daily.   glucose blood (ONE TOUCH ULTRA TEST) test strip Use to test BS twice a day   Lancets (ONETOUCH DELICA PLUS [id number]) MISC USE 1 EACH TWICE DAILY   levothyroxine (SYNTHROID) 150 MCG tablet Take 75-150 mcg by mouth See admin instructions. Take 150 mcg daily except Sundays take 75 mcg   lisinopril-hydrochlorothiazide (ZESTORETIC) 10-12.5 MG tablet TAKE 1 TABLET BY MOUTH DAILY   metFORMIN (GLUCOPHAGE-XR) 500 MG 24 hr tablet TAKE 2 TABLETS(1000 MG) BY MOUTH TWICE DAILY   Polyvinyl Alcohol-Povidone (REFRESH OP) Place 1 drop into both eyes daily.   pregabalin (LYRICA) 50 MG capsule Take 2 capsules (100 mg total) by mouth 4 (four) times daily. 2 in am; 1 at 1 pm; 1 at HS   senna (SENOKOT) 8.6 MG TABS tablet Take 1 tablet (8.6 mg total) by mouth daily as needed for mild constipation.   traMADol  (ULTRAM) 50 MG tablet Take 2 tablets (100 mg total) by mouth every 6 (six) hours as needed.       06/22/2022   11:05 AM 02/20/2022    9:13 AM 12/06/2021   10:12 AM 06/20/2021    9:41 AM  GAD 7 : Generalized Anxiety Score  Nervous, Anxious, on Edge 1 1 0 0  Control/stop worrying 1 0 0 0  Worry too much - different things 0 0 0 0  Trouble relaxing 0 0 0 0  Restless 0 0 0 0  Easily annoyed or irritable 0 0 0 0  Afraid - awful might happen 0 0 0 0  Total GAD 7 Score 2 1 0 0  Anxiety Difficulty Not difficult at all Not difficult at all Not difficult at all Not difficult at all       06/22/2022   11:05 AM 02/20/2022    9:13 AM 12/06/2021   10:12 AM  Depression screen PHQ 2/9  Decreased Interest 0 0 0  Down,  Depressed, Hopeless 0 0 0  PHQ - 2 Score 0 0 0  Altered sleeping 0 1 0  Tired, decreased energy 1 1 0  Change in appetite 1 0 0  Feeling bad or failure about yourself  1 0 0  Trouble concentrating 0 0 0  Moving slowly or fidgety/restless 0 0 0  Suicidal thoughts 0 0 0  PHQ-9 Score 3 2 0  Difficult doing work/chores Not difficult at all Not difficult at all Not difficult at all    BP Readings from Last 3 Encounters:  06/22/22 128/68  02/20/22 112/62  01/05/22 (!) 162/58    Physical Exam Constitutional:      Appearance: Normal appearance.  Cardiovascular:     Rate and Rhythm: Normal rate and regular rhythm.     Heart sounds: No murmur heard. Pulmonary:     Effort: Pulmonary effort is normal.     Breath sounds: No wheezing or rhonchi.  Musculoskeletal:     Cervical back: Normal range of motion.  Lymphadenopathy:     Cervical: No cervical adenopathy.  Skin:    General: Skin is warm and dry.  Neurological:     Mental Status: She is alert. Mental status is at baseline.     Wt Readings from Last 3 Encounters:  06/22/22 237 lb (107.5 kg)  02/20/22 233 lb 6.4 oz (105.9 kg)  01/04/22 240 lb (108.9 kg)    BP 128/68   Pulse 89   Ht '5\' 8"'$  (1.727 m)   Wt 237 lb (107.5  kg)   SpO2 94%   BMI 36.04 kg/m   Assessment and Plan: Problem List Items Addressed This Visit       Endocrine   Type II diabetes mellitus with complication (HCC) - Primary (Chronic)    Clinically stable by exam and report without s/s of hypoglycemia. DM complicated by hypertension and dyslipidemia. Tolerating medications well. Continue metformin Recent BMP was normal       Relevant Orders   POCT glycosylated hemoglobin (Hb A1C) (Completed)     Other   Tobacco use disorder, moderate, in sustained remission    Not interested in LDCT screening        Partially dictated using [place]. Any errors are unintentional.  Halina Maidens, MD [facility]  06/22/2022

## 2022-06-22 NOTE — Assessment & Plan Note (Signed)
Not interested in LDCT screening

## 2022-06-26 DIAGNOSIS — Z09 Encounter for follow-up examination after completed treatment for conditions other than malignant neoplasm: Secondary | ICD-10-CM | POA: Diagnosis not present

## 2022-06-26 DIAGNOSIS — E278 Other specified disorders of adrenal gland: Secondary | ICD-10-CM | POA: Diagnosis not present

## 2022-07-10 DIAGNOSIS — L989 Disorder of the skin and subcutaneous tissue, unspecified: Secondary | ICD-10-CM | POA: Diagnosis not present

## 2022-07-10 DIAGNOSIS — L308 Other specified dermatitis: Secondary | ICD-10-CM | POA: Diagnosis not present

## 2022-07-10 DIAGNOSIS — L218 Other seborrheic dermatitis: Secondary | ICD-10-CM | POA: Diagnosis not present

## 2022-07-10 DIAGNOSIS — D485 Neoplasm of uncertain behavior of skin: Secondary | ICD-10-CM | POA: Diagnosis not present

## 2022-07-10 DIAGNOSIS — L570 Actinic keratosis: Secondary | ICD-10-CM | POA: Diagnosis not present

## 2022-08-14 DIAGNOSIS — L905 Scar conditions and fibrosis of skin: Secondary | ICD-10-CM | POA: Diagnosis not present

## 2022-08-14 DIAGNOSIS — D0339 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: Secondary | ICD-10-CM | POA: Diagnosis not present

## 2022-09-10 ENCOUNTER — Other Ambulatory Visit: Payer: Self-pay | Admitting: Internal Medicine

## 2022-09-10 DIAGNOSIS — M170 Bilateral primary osteoarthritis of knee: Secondary | ICD-10-CM

## 2022-09-10 NOTE — Telephone Encounter (Signed)
Medication Refill - Medication: traMADol (ULTRAM) 50 MG tablet [id number]   Has the patient contacted their pharmacy? Yes.   (Agent: If no, request that the patient contact the pharmacy for the refill. If patient does not wish to contact the pharmacy document the reason why and proceed with request.) (Agent: If yes, when and what did the pharmacy advise?)  Preferred Pharmacy (with phone number or street name):  [facility] DR[facility] STORE [place], [place] - [address] [place], [place] [address] Phone: [phone]  Fax: [phone]   Has the patient been seen for an appointment in the last year OR does the patient have an upcoming appointment? Yes.    Agent: Please be advised that RX refills may take up to 3 business days. We ask that you follow-up with your pharmacy.

## 2022-09-11 MED ORDER — TRAMADOL HCL 50 MG PO TABS: 100.0000 mg | ORAL_TABLET | Freq: Four times a day (QID) | ORAL | 2 refills | Status: DC | PRN

## 2022-09-11 NOTE — Telephone Encounter (Signed)
Requested medication (s) are due for refill today: Yes  Requested medication (s) are on the active medication list: Yes  Last refill:  05/15/22  Future visit scheduled: Yes  Notes to clinic:  Unable to refill per protocol, cannot delegate.      Requested Prescriptions  Pending Prescriptions Disp Refills   traMADol (ULTRAM) 50 MG tablet 240 tablet 2    Sig: Take 2 tablets (100 mg total) by mouth every 6 (six) hours as needed.     Not Delegated - Analgesics:  Opioid Agonists Failed - 09/10/2022 10:58 AM      Failed - This refill cannot be delegated      Failed - Urine Drug Screen completed in last 360 days      Passed - Valid encounter within last 3 months    Recent Outpatient Visits           2 months ago Type II diabetes mellitus with complication [facility])   [place] [facility] at [facility], Jesse Sans, MD   6 months ago Annual physical exam   [place] at [facility], Jesse Sans, MD   9 months ago Essential (primary) hypertension   [place] [facility] at [facility], Jesse Sans, MD   1 year ago Type 2 diabetes mellitus with stage 3a chronic kidney disease, without long-term current use of insulin ([place])   [place] at [facility], Jesse Sans, MD   1 year ago Annual physical exam   [place] at [facility], Jesse Sans, MD       Future Appointments             In 1 month Army Melia, Jesse Sans, MD [place] at [facility], [facility]   In 5 months Army Melia, Jesse Sans, MD [place] at [facility], [facility]

## 2022-09-13 DIAGNOSIS — E119 Type 2 diabetes mellitus without complications: Secondary | ICD-10-CM | POA: Diagnosis not present

## 2022-09-13 LAB — HM DIABETES EYE EXAM

## 2022-09-17 ENCOUNTER — Other Ambulatory Visit: Payer: Self-pay | Admitting: Internal Medicine

## 2022-09-17 DIAGNOSIS — N1831 Chronic kidney disease, stage 3a: Secondary | ICD-10-CM

## 2022-09-17 NOTE — Telephone Encounter (Signed)
Medication Refill - Medication: metFORMIN (GLUCOPHAGE-XR) 500 MG 24 hr tablet   Has the patient contacted their pharmacy? No.   Preferred Pharmacy (with phone number or street name):  [facility] DR[facility] STORE [place], [place] AT [address] Phone: [phone]  Fax: [phone]     Has the patient been seen for an appointment in the last year OR does the patient have an upcoming appointment? Yes.    Agent: Please be advised that RX refills may take up to 3 business days. We ask that you follow-up with your pharmacy.

## 2022-09-18 MED ORDER — METFORMIN HCL ER 500 MG PO TB24: ORAL_TABLET | ORAL | 0 refills | Status: DC

## 2022-09-18 NOTE — Telephone Encounter (Signed)
Requested Prescriptions  Pending Prescriptions Disp Refills   metFORMIN (GLUCOPHAGE-XR) 500 MG 24 hr tablet 360 tablet 0    Sig: TAKE 2 TABLETS(1000 MG) BY MOUTH TWICE DAILY     Endocrinology:  Diabetes - Biguanides Failed - 09/17/2022 10:27 AM      Failed - B12 Level in normal range and within 720 days    Vitamin B-12  Date Value Ref Range Status  10/04/2015 246 180 - 914 pg/mL Final    Comment:    (NOTE) This assay is not validated for testing neonatal or myeloproliferative syndrome specimens for Vitamin B12 levels. Performed at [facility] in normal range and within 360 days    Creatinine  Date Value Ref Range Status  05/29/2022 0.9 0.5 - 1.1 Final   Creatinine, Ser  Date Value Ref Range Status  02/20/2022 1.06 (H) 0.57 - 1.00 mg/dL Final         Passed - HBA1C is between 0 and 7.9 and within 180 days    Hemoglobin A1C  Date Value Ref Range Status  06/22/2022 6.0 (A) 4.0 - 5.6 % Final   Hgb A1c MFr Bld  Date Value Ref Range Status  02/20/2022 5.5 4.8 - 5.6 % Final    Comment:             Prediabetes: 5.7 - 6.4          Diabetes: >6.4          Glycemic control for adults with diabetes: <7.0          Passed - eGFR in normal range and within 360 days    GFR calc Af Amer  Date Value Ref Range Status  05/09/2020 65 >59 mL/min/1.73 Final    Comment:    **In accordance with recommendations from the NKF-ASN Task force,**   [facility] is in the process of updating its eGFR calculation to the   2021 CKD-EPI creatinine equation that estimates kidney function   without a race variable.    GFR, Estimated  Date Value Ref Range Status  01/05/2022 56 (L) >60 mL/min Final    Comment:    (NOTE) Calculated using the CKD-EPI Creatinine Equation (2021)    eGFR  Date Value Ref Range Status  05/29/2022 67  Final  02/20/2022 55 (L) >59 mL/min/1.73 Final         Passed - Valid encounter within last 6 months    Recent Outpatient Visits            2 months ago Type II diabetes mellitus with complication [facility])   [place] [facility] at [facility], Jesse Sans, MD   7 months ago Annual physical exam   [place] at [facility], Jesse Sans, MD   9 months ago Essential (primary) hypertension   [place] [facility] at [facility], Jesse Sans, MD   1 year ago Type 2 diabetes mellitus with stage 3a chronic kidney disease, without long-term current use of insulin ([place])   [place] at [facility], Jesse Sans, MD   1 year ago Annual physical exam   [facility] at [facility], Jesse Sans, MD       Future Appointments             In  1 month Glean Hess, MD [place] at [facility]   In 5 months Glean Hess, MD [facility] at [facility], [place] within normal limits and completed in the last 12 months    WBC  Date Value Ref Range Status  05/29/2022 14.5  Final  01/05/2022 11.4 (H) 4.0 - 10.5 K/uL Final   RBC  Date Value Ref Range Status  05/29/2022 3.7 (A) 3.87 - 5.11 Final   Hemoglobin  Date Value Ref Range Status  05/29/2022 9.3 (A) 12.0 - 16.0 Final  02/20/2022 9.9 (L) 11.1 - 15.9 g/dL Final   HCT  Date Value Ref Range Status  05/29/2022 29 (A) 36 - 46 Final   Hematocrit  Date Value Ref Range Status  02/20/2022 31.8 (L) 34.0 - 46.6 % Final   MCHC  Date Value Ref Range Status  02/20/2022 31.1 (L) 31.5 - 35.7 g/dL Final  01/05/2022 31.1 30.0 - 36.0 g/dL Final   [facility]  Date Value Ref Range Status  02/20/2022 25.0 (L) 26.6 - 33.0 pg Final  01/05/2022 26.4 26.0 - 34.0 pg Final   MCV  Date Value Ref Range Status  02/20/2022 80 79 - 97 fL Final   No results found for: "PLTCOUNTKUC", "LABPLAT", "POCPLA" RDW  Date  Value Ref Range Status  02/20/2022 13.4 11.7 - 15.4 % Final

## 2022-10-03 DIAGNOSIS — M5416 Radiculopathy, lumbar region: Secondary | ICD-10-CM | POA: Diagnosis not present

## 2022-10-03 DIAGNOSIS — M48062 Spinal stenosis, lumbar region with neurogenic claudication: Secondary | ICD-10-CM | POA: Diagnosis not present

## 2022-10-10 DIAGNOSIS — E119 Type 2 diabetes mellitus without complications: Secondary | ICD-10-CM | POA: Diagnosis not present

## 2022-10-10 DIAGNOSIS — M1712 Unilateral primary osteoarthritis, left knee: Secondary | ICD-10-CM | POA: Diagnosis not present

## 2022-10-10 DIAGNOSIS — I700 Atherosclerosis of aorta: Secondary | ICD-10-CM | POA: Diagnosis not present

## 2022-10-10 DIAGNOSIS — Z96651 Presence of right artificial knee joint: Secondary | ICD-10-CM | POA: Diagnosis not present

## 2022-10-10 DIAGNOSIS — E1142 Type 2 diabetes mellitus with diabetic polyneuropathy: Secondary | ICD-10-CM | POA: Diagnosis not present

## 2022-10-22 ENCOUNTER — Other Ambulatory Visit: Payer: Self-pay | Admitting: Internal Medicine

## 2022-10-22 ENCOUNTER — Encounter: Payer: Self-pay | Admitting: Internal Medicine

## 2022-10-22 ENCOUNTER — Ambulatory Visit (INDEPENDENT_AMBULATORY_CARE_PROVIDER_SITE_OTHER): Payer: Medicare Other

## 2022-10-22 ENCOUNTER — Ambulatory Visit (INDEPENDENT_AMBULATORY_CARE_PROVIDER_SITE_OTHER): Payer: Medicare Other | Admitting: Internal Medicine

## 2022-10-22 VITALS — BP 128/74 | HR 102 | Ht 68.0 in | Wt 236.4 lb

## 2022-10-22 DIAGNOSIS — E785 Hyperlipidemia, unspecified: Secondary | ICD-10-CM

## 2022-10-22 DIAGNOSIS — E1169 Type 2 diabetes mellitus with other specified complication: Secondary | ICD-10-CM | POA: Diagnosis not present

## 2022-10-22 DIAGNOSIS — I10 Essential (primary) hypertension: Secondary | ICD-10-CM

## 2022-10-22 DIAGNOSIS — N1831 Chronic kidney disease, stage 3a: Secondary | ICD-10-CM | POA: Diagnosis not present

## 2022-10-22 DIAGNOSIS — Z0000 Encounter for general adult medical examination without abnormal findings: Secondary | ICD-10-CM

## 2022-10-22 DIAGNOSIS — E118 Type 2 diabetes mellitus with unspecified complications: Secondary | ICD-10-CM

## 2022-10-22 DIAGNOSIS — I700 Atherosclerosis of aorta: Secondary | ICD-10-CM | POA: Diagnosis not present

## 2022-10-22 LAB — POCT GLYCOSYLATED HEMOGLOBIN (HGB A1C): Hemoglobin A1C: 5.7 % — AB (ref 4.0–5.6)

## 2022-10-22 NOTE — Assessment & Plan Note (Signed)
Stable exam with well controlled BP.  Currently taking lisinopril hct. Tolerating medications without concerns or side effects. Will continue to recommend low sodium diet and current regimen.  

## 2022-10-22 NOTE — Assessment & Plan Note (Addendum)
Blood sugars stable without hypoglycemic symptoms or events. Currently being treated with metformin. Lab Results  Component Value Date   HGBA1C 6.0 (A) 06/22/2022  A1C today improved to 5.7. Continue same medications but monitor for hypoglycemia. Eye done recently - will be requested

## 2022-10-22 NOTE — Progress Notes (Signed)
Subjective:   Kendra Walton is a 72 y.o. female who presents for Medicare Annual (Subsequent) preventive examination.  I connected with  Meha Goon Swaney on 10/22/22 by an in person visit and verified that I am speaking with the correct person using two identifiers.  Patient Location: Other:  In Office  Provider Location: Office/Clinic  I discussed the limitations of evaluation and management by telemedicine. The patient expressed understanding and agreed to proceed.   Review of Systems    Defer to PCP       Objective:    Today's Vitals   10/22/22 1115  PainSc: 0-No pain   There is no height or weight on file to calculate BMI.     01/04/2022    2:01 PM 01/04/2022    6:38 AM 12/26/2021   11:25 AM 11/08/2021   10:34 AM 11/03/2021   10:33 AM 10/27/2021    5:48 AM 08/03/2021   12:52 PM  Advanced Directives  Does Patient Have a Medical Advance Directive? No No No No No No Yes  Would patient like information on creating a medical advance directive? No - Patient declined No - Patient declined No - Patient declined No - Patient declined No - Patient declined No - Patient declined     Current Medications (verified) Outpatient Encounter Medications as of 10/22/2022  Medication Sig   aspirin EC 81 MG tablet Take 1 tablet by mouth daily.   atorvastatin (LIPITOR) 20 MG tablet Take 1 tablet (20 mg total) by mouth at bedtime.   clobetasol cream (TEMOVATE) 0.05 % Apply topically 2 (two) times daily.   docusate sodium (COLACE) 100 MG capsule Take 1 capsule (100 mg total) by mouth 2 (two) times daily.   Ferrous Gluconate (IRON 27 PO) Take by mouth.   glucose blood (ONE TOUCH ULTRA TEST) test strip Use to test BS twice a day   ketoconazole (NIZORAL) 2 % cream Apply topically 2 (two) times daily.   Lancets (ONETOUCH DELICA PLUS LANCET33G) MISC USE 1 EACH TWICE DAILY   levothyroxine (SYNTHROID) 150 MCG tablet Take 75-150 mcg by mouth See admin instructions. Take 150 mcg  daily except Sundays take 75 mcg   lisinopril-hydrochlorothiazide (ZESTORETIC) 10-12.5 MG tablet TAKE 1 TABLET BY MOUTH DAILY   metFORMIN (GLUCOPHAGE-XR) 500 MG 24 hr tablet TAKE 2 TABLETS(1000 MG) BY MOUTH TWICE DAILY   Polyvinyl Alcohol-Povidone (REFRESH OP) Place 1 drop into both eyes daily.   pregabalin (LYRICA) 50 MG capsule Take 2 capsules (100 mg total) by mouth 4 (four) times daily. 2 in am; 1 at 1 pm; 1 at HS   senna (SENOKOT) 8.6 MG TABS tablet Take 1 tablet (8.6 mg total) by mouth daily as needed for mild constipation.   traMADol (ULTRAM) 50 MG tablet Take 2 tablets (100 mg total) by mouth every 6 (six) hours as needed.   vitamin B-12 (CYANOCOBALAMIN) 50 MCG tablet Take 50 mcg by mouth daily.   No facility-administered encounter medications on file as of 10/22/2022.    Allergies (verified) Shellfish allergy   History: Past Medical History:  Diagnosis Date   Absolute anemia 09/15/2015   Patient declines colonoscopy    Aortic stenosis, moderate 04/05/2017   a.) s/p TAVR 08/18/2018; LEFT axillary approach   Arthritis    Breast cancer, right (HCC) 2010   a.) s/p RIGHT mastectomy; no chemotherpay or XRT   CAD (coronary artery disease) 08/06/2018   a.) R/LHC 08/06/2018: EF 35%; 20% stenosis oRI; severe AS (MPG 48.2 mmHg; AVA (  VTI) = 0.58 cm); mean PA = 36 mmHg, mean PCWP = 27 mmHg, PVR 1.78 WU, CO 5.06 L/min, CI 2.25 L/min/m   CHF (congestive heart failure) (HCC)    a.)  TTE 07/29/2018: EF 35%; moderate global HK; moderate LVH; mild LA dilation; trivial TR/PR, mild AR/MR; moderate AS (MPG 48.2 mmHg); G2DD. b.) R/LHC 08/06/2018; EF 35%; mean PCWP 27 mmHg. c.)  TTE 10/20/2019: EF >55%; mild LVH; trivial MR/TR, mild PR, prostatic AoV. d.)  TTE 08/17/2021: EF >55%; mild LVH; mild LA dilation; trivial MR/TR/PR; prostatic AoV (MPG 12 mmHg); G1DD.   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 b.) CT abd 02/06/2019: measured 4.7 x 3.7 cm. c.) MRI lumbar spine 10/25/2021: interval increase to 7.1 x 7.1 cm.   T2DM (type 2 diabetes mellitus) [facility])    Past Surgical History:  Procedure Laterality Date   ADRENALECTOMY Right 05/28/2022   APPENDECTOMY     CATARACT EXTRACTION W/ INTRAOCULAR LENS  IMPLANT, BILATERAL  2010   CESAREAN SECTION  1983   CHOLECYSTECTOMY  2005   ECTOPIC PREGNANCY SURGERY     LUMBAR LAMINECTOMY/DECOMPRESSION MICRODISCECTOMY Right 11/08/2021   Procedure: RIGHT L5-S1 MICRODISCECTOMY;  Surgeon: Venetia Night, MD;  Location: [facility] ORS;  Service: Neurosurgery;  Laterality: Right;   MASTECTOMY, PARTIAL Right 2010   REDUCTION MAMMAPLASTY Left 2011   RIGHT/LEFT HEART CATH AND CORONARY ANGIOGRAPHY Bilateral 08/06/2018   Procedure: RIGHT/LEFT HEART CATH AND CORONARY ANGIOGRAPHY;  Surgeon: Dalia Heading, MD;  Location: [facility] INVASIVE CV LAB;  Service: Cardiovascular;  Laterality: Bilateral;   TOTAL KNEE ARTHROPLASTY Right 01/04/2022   Procedure: TOTAL KNEE ARTHROPLASTY;  Surgeon: Kennedy Bucker, MD;  Location: [facility] ORS;  Service: Orthopedics;  Laterality: Right;   TRANSCATHETER AORTIC VALVE REPLACEMENT, TRANSAPICAL  08/18/2018   Procedure: TRANSCATHETER AORTIC VALVE REPLACEMENT; Location: [facility]; Surgeon: Flint Melter, MD   Family History  Problem Relation Age of Onset   Hypertension Brother    Diabetes Brother    Breast cancer Neg Hx    Social History   Socioeconomic History   Marital status: Married    Spouse name: Rosanne Ashing    Number of children: 1   Years of  education: Not on file   Highest education level: Some college, no degree  Occupational History   Not on file  Tobacco Use   Smoking status: Former    Packs/day: 1.00    Years: 40.00    Additional pack years: 0.00    Total pack years: 40.00    Types: Cigarettes    Quit date: 06/18/2012    Years since quitting: 10.3   Smokeless tobacco: Never  Vaping Use   Vaping Use: Never used  Substance and Sexual Activity   Alcohol use: No    Alcohol/week: 0.0 standard drinks of alcohol   Drug use: No   Sexual activity: Never  Other Topics Concern   Not on file  Social History Narrative   Not on file   Social Determinants  of Health   Financial Resource Strain: Low Risk  (10/19/2022)   Overall Financial Resource Strain (CARDIA)    Difficulty of Paying Living Expenses: Not hard at all  Food Insecurity: No Food Insecurity (10/19/2022)   Hunger Vital Sign    Worried About Running Out of Food in the Last Year: Never true    Ran Out of Food in the Last Year: Never true  Transportation Needs: No Transportation Needs (10/19/2022)   PRAPARE - Administrator, Civil Service (Medical): No    Lack of Transportation (Non-Medical): No  Physical Activity: Insufficiently Active (10/19/2022)   Exercise Vital Sign    Days of Exercise per Week: 1 day    Minutes of Exercise per Session: 30 min  Stress: No Stress Concern Present (10/19/2022)   [employer] of Occupational Health - Occupational Stress Questionnaire    Feeling of Stress : Not at all  Social Connections: Socially Isolated (10/19/2022)   Social Connection and Isolation Panel [NHANES]    Frequency of Communication with Friends and Family: Once a week    Frequency of Social Gatherings with Friends and Family: Never    Attends Religious Services: Never    Database administrator or Organizations: No    Attends Engineer, structural: Not on file    Marital Status: Married    Tobacco Counseling Counseling given: Not  Answered   Clinical Intake:  Pre-visit preparation completed: Yes  Pain : No/denies pain Pain Score: 0-No pain     BMI - recorded: 35.94 Nutritional Status: BMI > 30  Obese Nutritional Risks: None Diabetes: Yes  How often do you need to have someone help you when you read instructions, pamphlets, or other written materials from your doctor or pharmacy?: 1 - Never  Diabetic? No.  Interpreter Needed?: No  Information entered by :: Margaretha Sheffield, CMA   Activities of Daily Living    01/04/2022    2:01 PM 12/26/2021   11:27 AM  In your present state of health, do you have any difficulty performing the following activities:  Hearing? 0   Vision? 0   Difficulty concentrating or making decisions? 0   Walking or climbing stairs? 1   Dressing or bathing? 0   Doing errands, shopping? 0 0  Comment Simultaneous filing. User may not have seen previous data.     Patient Care Team: Reubin Milan, MD as PCP - General (Internal Medicine) Lady Gary Darlin Priestly, MD as Consulting Physician (Cardiology) Nevada Crane, MD as Consulting Physician (Ophthalmology) Sherlon Handing, MD as Consulting Physician (Internal Medicine) Gwyneth Revels, DPM as Referring Physician (Podiatry) Jesusita Oka, MD (Dermatology) Kennedy Bucker, MD as Consulting Physician (Orthopedic Surgery)  Indicate any recent Medical Services you may have received from other than [facility] providers in the past year (date may be approximate).     Assessment:   This is a routine wellness examination for Kendra Walton.  Hearing/Vision screen  No concerns at this time.  Dietary issues and exercise activities discussed:     Goals Addressed   None   Depression Screen    10/22/2022   10:47 AM 06/22/2022   11:05 AM 02/20/2022    9:13 AM 12/06/2021   10:12 AM 06/20/2021    9:40 AM 02/15/2021    9:19 AM 09/13/2020    8:57 AM  PHQ 2/9 Scores  PHQ - 2 Score 0 0 0 0 0 0 0  PHQ- 9 Score 1 3 2  0 2 2 0  Fall  Risk    10/22/2022   10:47 AM 06/22/2022   11:05 AM 02/20/2022    9:13 AM 12/06/2021   10:13 AM 06/20/2021    9:41 AM  Fall Risk   Falls in the past year? 0 0 0 0 0  Number falls in past yr: 0 0 0 0 0  Injury with Fall? 0 0 0 0 0  Risk for fall due to : No Fall Risks No Fall Risks No Fall Risks No Fall Risks No Fall Risks  Follow up Falls evaluation completed Falls evaluation completed Falls evaluation completed Falls evaluation completed Falls evaluation completed    FALL RISK PREVENTION PERTAINING TO THE HOME:  Any stairs in or around the home? Yes  If so, are there any without handrails? No  Home free of loose throw rugs in walkways, pet beds, electrical cords, etc? Yes  Adequate lighting in your home to reduce risk of falls? Yes   ASSISTIVE DEVICES UTILIZED TO PREVENT FALLS:  Life alert? No  Use of a cane, walker or w/c? No   TIMED UP AND GO:  Was the test performed? Yes .  Gait steady and fast without use of assistive device  Immunizations Immunization History  Administered Date(s) Administered   Fluad Quad(high Dose 65+) 05/07/2019, 05/09/2020, 02/15/2021   Influenza, High Dose Seasonal PF 04/29/2018   Influenza,inj,Quad PF,6+ Mos 04/07/2015, 04/13/2016, 07/24/2017, 02/20/2022   Influenza-Unspecified 05/19/2014   Moderna Sars-Covid-2 Vaccination 06/23/2020   PFIZER(Purple Top)SARS-COV-2 Vaccination 08/16/2019, 09/07/2019, 06/15/2020   Pneumococcal Conjugate-13 08/08/2015   Pneumococcal Polysaccharide-23 12/25/2017    TDAP status: Due, Education has been provided regarding the importance of this vaccine. Advised may receive this vaccine at local pharmacy or Health Dept. Aware to provide a copy of the vaccination record if obtained from local pharmacy or Health Dept. Verbalized acceptance and understanding.  Flu Vaccine status: Up to date  Pneumococcal vaccine status: Up to date  Covid-19 vaccine status: Completed vaccines  Qualifies for Shingles Vaccine? Yes    Zostavax completed No   Shingrix Completed?: No.    Education has been provided regarding the importance of this vaccine. Patient has been advised to call insurance company to determine out of pocket expense if they have not yet received this vaccine. Advised may also receive vaccine at local pharmacy or Health Dept. Verbalized acceptance and understanding.  Screening Tests Health Maintenance  Topic Date Due   DTaP/Tdap/Td (1 - Tdap) Never done   COLONOSCOPY (Pts 45-68yrs Insurance coverage will need to be confirmed)  Never done   Lung Cancer Screening  Never done   Zoster Vaccines- Shingrix (1 of 2) Never done   COVID-19 Vaccine (5 - 2023-24 season) 02/16/2022   OPHTHALMOLOGY EXAM  06/23/2022   INFLUENZA VACCINE  01/17/2023   Diabetic kidney evaluation - Urine ACR  02/21/2023   FOOT EXAM  02/21/2023   COLON CANCER SCREENING ANNUAL FOBT  03/01/2023   MAMMOGRAM  04/06/2023   HEMOGLOBIN A1C  04/24/2023   Diabetic kidney evaluation - eGFR measurement  05/30/2023   Medicare Annual Wellness (AWV)  10/22/2023   Pneumonia Vaccine 55+ Years old  Completed   Hepatitis C Screening  Completed   HPV VACCINES  Aged Out   DEXA SCAN  Discontinued    Health Maintenance  Health Maintenance Due  Topic Date Due   DTaP/Tdap/Td (1 - Tdap) Never done   COLONOSCOPY (Pts 45-35yrs Insurance coverage will need to be confirmed)  Never done   Lung Cancer Screening  Never done   Zoster Vaccines- Shingrix (1 of 2) Never done   COVID-19 Vaccine (5 - 2023-24 season) 02/16/2022   OPHTHALMOLOGY EXAM  06/23/2022    Mammogram status: Completed 04/05/22. Repeat every year  Lung Cancer Screening: (Low Dose CT Chest recommended if Age 74-80 years, 30 pack-year currently smoking OR have quit w/in 15years.) does not qualify.   Additional Screening:  Hepatitis C Screening: does qualify; Completed 12/25/2017  Vision Screening: Recommended annual ophthalmology exams for early detection of glaucoma and other  disorders of the eye. Is the patient up to date with their annual eye exam?  Yes  Who is the provider or what is the name of the office in which the patient attends annual eye exams? [facility] [place]  Dental Screening: Recommended annual dental exams for proper oral hygiene  Community Resource Referral / Chronic Care Management: CRR required this visit?  No   CCM required this visit?  No      Plan:     I have personally reviewed and noted the following in the patient's chart:   Medical and social history Use of alcohol, tobacco or illicit drugs  Current medications and supplements including opioid prescriptions. Patient is not currently taking opioid prescriptions. Functional ability and status Nutritional status Physical activity Advanced directives List of other physicians Hospitalizations, surgeries, and ER visits in previous 12 months Vitals Screenings to include cognitive, depression, and falls Referrals and appointments  In addition, I have reviewed and discussed with patient certain preventive protocols, quality metrics, and best practice recommendations. A written personalized care plan for preventive services as well as general preventive health recommendations were provided to patient.     Mariel Sleet, CMA   10/22/2022   Nurse Notes: None.

## 2022-10-22 NOTE — Progress Notes (Signed)
Date:  10/22/2022   Name:  Kendra Walton   DOB:  [birth year]   MRN:  [id number]   Chief Complaint: Diabetes and Hypertension  Diabetes She presents for her follow-up diabetic visit. She has type 2 diabetes mellitus. Her disease course has been stable. Pertinent negatives for hypoglycemia include no headaches or tremors. Pertinent negatives for diabetes include no chest pain, no fatigue, no polydipsia and no polyuria. There are no hypoglycemic complications. Pertinent negatives for diabetic complications include no CVA. Current diabetic treatment includes oral agent (monotherapy).  Hypertension This is a chronic problem. The problem is controlled. Pertinent negatives include no chest pain, headaches, palpitations or shortness of breath. Past treatments include ACE inhibitors and diuretics. The current treatment provides significant improvement. There is no history of kidney disease, CAD/MI or CVA.    Lab Results  Component Value Date   NA 139 05/29/2022   K 4.4 05/29/2022   CO2 26 (A) 05/29/2022   GLUCOSE 130 (H) 02/20/2022   BUN 17 05/29/2022   CREATININE 0.9 05/29/2022   CALCIUM 9.0 05/29/2022   EGFR 67 05/29/2022   GFRNONAA 56 (L) 01/05/2022   Lab Results  Component Value Date   CHOL 151 02/20/2022   HDL 52 02/20/2022   LDLCALC 72 02/20/2022   TRIG 156 (H) 02/20/2022   CHOLHDL 2.9 02/20/2022   Lab Results  Component Value Date   TSH 3.08 02/05/2022   Lab Results  Component Value Date   HGBA1C 5.7 (A) 10/22/2022   Lab Results  Component Value Date   WBC 14.5 05/29/2022   HGB 9.3 (A) 05/29/2022   HCT 29 (A) 05/29/2022   MCV 80 02/20/2022   PLT 271 05/29/2022   Lab Results  Component Value Date   ALT 17 02/20/2022   AST 16 02/20/2022   ALKPHOS 87 02/20/2022   BILITOT 0.3 02/20/2022   No results found for: "25OHVITD2", "25OHVITD3", "VD25OH"   Review of Systems  Constitutional:  Negative for appetite change, fatigue, fever and unexpected weight  change.  HENT:  Negative for tinnitus and trouble swallowing.   Eyes:  Negative for visual disturbance.  Respiratory:  Negative for cough, chest tightness and shortness of breath.   Cardiovascular:  Negative for chest pain, palpitations and leg swelling.  Gastrointestinal:  Negative for abdominal pain.  Endocrine: Negative for polydipsia and polyuria.  Genitourinary:  Negative for dysuria and hematuria.  Musculoskeletal:  Negative for arthralgias.  Neurological:  Positive for numbness (foot pain well controlled). Negative for tremors and headaches.  Psychiatric/Behavioral:  Negative for dysphoric mood.     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   Obesity (BMI 30-39.9) 07/09/2018   Hearing decreased 12/13/2015   Hyperlipidemia associated with type 2 diabetes mellitus (HCC) 09/14/2015   History of breast cancer in female 04/07/2015   Acquired hypothyroidism 10/04/2014   Diabetic peripheral neuropathy (HCC) 10/04/2014   Essential (primary) hypertension 10/04/2014   DM (diabetes mellitus), type 2 with neurological complications (HCC) 10/04/2014    Allergies  Allergen Reactions   Shellfish Allergy Hives and Itching    Past Surgical History:  Procedure Laterality Date   ADRENALECTOMY Right 05/28/2022  APPENDECTOMY     CATARACT EXTRACTION W/ INTRAOCULAR LENS  IMPLANT, BILATERAL  2010   CESAREAN SECTION  1983   CHOLECYSTECTOMY  2005   ECTOPIC PREGNANCY SURGERY     LUMBAR LAMINECTOMY/DECOMPRESSION MICRODISCECTOMY Right 11/08/2021   Procedure: RIGHT L5-S1 MICRODISCECTOMY;  Surgeon: Venetia Night, MD;  Location: [facility]  ORS;  Service: Neurosurgery;  Laterality: Right;   MASTECTOMY, PARTIAL Right 2010   REDUCTION MAMMAPLASTY Left 2011   RIGHT/LEFT HEART CATH AND CORONARY ANGIOGRAPHY Bilateral 08/06/2018   Procedure: RIGHT/LEFT HEART CATH AND CORONARY ANGIOGRAPHY;  Surgeon: Dalia Heading, MD;  Location: [facility] INVASIVE CV LAB;  Service: Cardiovascular;  Laterality: Bilateral;   TOTAL KNEE ARTHROPLASTY Right 01/04/2022   Procedure: TOTAL KNEE ARTHROPLASTY;  Surgeon: Kennedy Bucker, MD;  Location: [facility] ORS;  Service: Orthopedics;  Laterality: Right;   TRANSCATHETER AORTIC VALVE REPLACEMENT, TRANSAPICAL  08/18/2018   Procedure: TRANSCATHETER AORTIC VALVE REPLACEMENT; Location: [facility]; Surgeon: Flint Melter, MD    Social History   Tobacco Use   Smoking status: Former    Packs/day: 1.00    Years: 40.00    Additional pack years: 0.00    Total pack years: 40.00    Types: Cigarettes    Quit date: 06/18/2012    Years since quitting: 10.3   Smokeless tobacco: Never  Vaping Use   Vaping Use: Never used  Substance Use Topics   Alcohol use: No    Alcohol/week: 0.0 standard drinks of alcohol   Drug use: No     Medication list has been reviewed and updated.  Current Meds  Medication Sig   aspirin EC 81 MG tablet Take 1 tablet by mouth daily.   atorvastatin (LIPITOR) 20 MG tablet Take 1 tablet (20 mg total) by mouth at bedtime.   clobetasol cream (TEMOVATE) 0.05 % Apply topically 2 (two) times daily.   docusate sodium (COLACE) 100 MG capsule Take 1 capsule (100 mg total) by mouth 2 (two) times daily.   Ferrous Gluconate (IRON 27 PO) Take by mouth.   glucose blood (ONE TOUCH ULTRA TEST) test strip Use to test BS twice a day   ketoconazole (NIZORAL) 2 % cream Apply topically 2 (two) times daily.   Lancets (ONETOUCH DELICA PLUS LANCET33G) MISC USE 1 EACH TWICE DAILY   levothyroxine (SYNTHROID) 150 MCG tablet Take 75-150 mcg by mouth See admin instructions. Take 150 mcg daily except Sundays take 75 mcg    lisinopril-hydrochlorothiazide (ZESTORETIC) 10-12.5 MG tablet TAKE 1 TABLET BY MOUTH DAILY   metFORMIN (GLUCOPHAGE-XR) 500 MG 24 hr tablet TAKE 2 TABLETS(1000 MG) BY MOUTH TWICE DAILY   Polyvinyl Alcohol-Povidone (REFRESH OP) Place 1 drop into both eyes daily.   pregabalin (LYRICA) 50 MG capsule Take 2 capsules (100 mg total) by mouth 4 (four) times daily. 2 in am; 1 at 1 pm; 1 at HS   senna (SENOKOT) 8.6 MG TABS tablet Take 1 tablet (8.6 mg total) by mouth daily as needed for mild constipation.   traMADol (ULTRAM) 50 MG tablet Take 2 tablets (100 mg total) by mouth every 6 (six) hours as needed.   vitamin B-12 (CYANOCOBALAMIN) 50 MCG tablet Take 50 mcg by mouth daily.       10/22/2022   10:47 AM 06/22/2022   11:05 AM 02/20/2022    9:13 AM 12/06/2021   10:12 AM  GAD 7 : Generalized Anxiety Score  Nervous, Anxious, on Edge 0 1 1 0  Control/stop worrying 0 1 0 0  Worry too much - different things 0 0  0 0  Trouble relaxing 0 0 0 0  Restless 0 0 0 0  Easily annoyed or irritable 0 0 0 0  Afraid - awful might happen 0 0 0 0  Total GAD 7 Score 0 2 1 0  Anxiety Difficulty Not difficult at all Not difficult at all Not difficult at all Not difficult at all       10/22/2022   10:47 AM 06/22/2022   11:05 AM 02/20/2022    9:13 AM  Depression screen PHQ 2/9  Decreased Interest 0 0 0  Down, Depressed, Hopeless 0 0 0  PHQ - 2 Score 0 0 0  Altered sleeping 0 0 1  Tired, decreased energy 0 1 1  Change in appetite 1 1 0  Feeling bad or failure about yourself  0 1 0  Trouble concentrating 0 0 0  Moving slowly or fidgety/restless 0 0 0  Suicidal thoughts 0 0 0  PHQ-9 Score 1 3 2   Difficult doing work/chores Not difficult at all Not difficult at all Not difficult at all    BP Readings from Last 3 Encounters:  10/22/22 128/74  06/22/22 128/68  02/20/22 112/62    Physical Exam Vitals and nursing note reviewed.  Constitutional:      General: She is not in acute distress.    Appearance: She is  well-developed.  HENT:     Head: Normocephalic and atraumatic.  Neck:     Vascular: No carotid bruit.  Cardiovascular:     Rate and Rhythm: Normal rate and regular rhythm.  Pulmonary:     Effort: Pulmonary effort is normal. No respiratory distress.     Breath sounds: No wheezing or rhonchi.  Musculoskeletal:     Cervical back: Normal range of motion.     Right lower leg: No edema.     Left lower leg: No edema.  Lymphadenopathy:     Cervical: No cervical adenopathy.  Skin:    General: Skin is warm and dry.     Capillary Refill: Capillary refill takes less than 2 seconds.     Findings: No rash.  Neurological:     Mental Status: She is alert and oriented to person, place, and time.  Psychiatric:        Mood and Affect: Mood normal.        Behavior: Behavior normal.        Thought Content: Thought content normal.     Wt Readings from Last 3 Encounters:  10/22/22 236 lb 6.4 oz (107.2 kg)  06/22/22 237 lb (107.5 kg)  02/20/22 233 lb 6.4 oz (105.9 kg)    BP 128/74   Pulse (!) 102   Ht 5\' 8"  (1.727 m)   Wt 236 lb 6.4 oz (107.2 kg)   SpO2 98%   BMI 35.94 kg/m   Assessment and Plan:  Problem List Items Addressed This Visit       Cardiovascular and Mediastinum   Aortic atherosclerosis (HCC)   Essential (primary) hypertension - Primary (Chronic)    Stable exam with well controlled BP.  Currently taking lisinopril hct. Tolerating medications without concerns or side effects. Will continue to recommend low sodium diet and current regimen.         Endocrine   Hyperlipidemia associated with type 2 diabetes mellitus (HCC) (Chronic)   Type II diabetes mellitus with complication (HCC) (Chronic)    Blood sugars stable without hypoglycemic symptoms or events. Currently being treated with metformin. Lab Results  Component Value Date  HGBA1C 6.0 (A) 06/22/2022  A1C today improved to 5.7. Continue same medications but monitor for hypoglycemia. Eye done recently - will be  requested       Relevant Orders   POCT glycosylated hemoglobin (Hb A1C) (Completed)     Genitourinary   Chronic kidney disease, stage 3a (HCC)    Return in about 4 months (around 02/22/2023) for CPX.   MAW completed today by CMA.  Partially dictated using Dragon software, any errors are not intentional.  Reubin Milan, MD [facility] [place], [place]

## 2022-10-23 ENCOUNTER — Encounter: Payer: Self-pay | Admitting: Internal Medicine

## 2022-10-29 ENCOUNTER — Other Ambulatory Visit: Payer: Self-pay | Admitting: Internal Medicine

## 2022-10-29 DIAGNOSIS — E785 Hyperlipidemia, unspecified: Secondary | ICD-10-CM

## 2022-10-29 NOTE — Telephone Encounter (Signed)
Medication Refill - Medication: atorvastatin (LIPITOR) 20 MG tablet   Has the patient contacted their pharmacy? Yes.   (Agent: If no, request that the patient contact the pharmacy for the refill. If patient does not wish to contact the pharmacy document the reason why and proceed with request.) (Agent: If yes, when and what did the pharmacy advise?)  Preferred Pharmacy (with phone number or street name):  [facility] #[id number] [place], [place] - [address] AT [facility] OF [address] [facility]  [address] [place] [address]  Phone: [phone] Fax: [phone]   Has the patient been seen for an appointment in the last year OR does the patient have an upcoming appointment? Yes.    Agent: Please be advised that RX refills may take up to 3 business days. We ask that you follow-up with your pharmacy.

## 2022-10-30 MED ORDER — ATORVASTATIN CALCIUM 20 MG PO TABS
20.0000 mg | ORAL_TABLET | Freq: Every evening | ORAL | 1 refills | Status: DC
Start: 2022-10-30 — End: 2023-02-19

## 2022-12-06 ENCOUNTER — Other Ambulatory Visit: Payer: Self-pay | Admitting: Internal Medicine

## 2022-12-06 DIAGNOSIS — I10 Essential (primary) hypertension: Secondary | ICD-10-CM

## 2023-01-03 DIAGNOSIS — M19032 Primary osteoarthritis, left wrist: Secondary | ICD-10-CM | POA: Diagnosis not present

## 2023-01-03 DIAGNOSIS — M67432 Ganglion, left wrist: Secondary | ICD-10-CM | POA: Diagnosis not present

## 2023-01-09 ENCOUNTER — Other Ambulatory Visit: Payer: Self-pay | Admitting: Internal Medicine

## 2023-01-09 DIAGNOSIS — E1142 Type 2 diabetes mellitus with diabetic polyneuropathy: Secondary | ICD-10-CM

## 2023-01-09 DIAGNOSIS — M170 Bilateral primary osteoarthritis of knee: Secondary | ICD-10-CM

## 2023-01-09 NOTE — Telephone Encounter (Signed)
Medication Refill - Medication: traMADol (ULTRAM) 50 MG tablet  Has the patient contacted their pharmacy? Yes.   No more refills  Preferred Pharmacy (with phone number or street name): [facility] #[id number] - [place], [place] - [address] AT [facility] OF [address] [facility]  Phone: [phone] Fax: [phone]  Has the patient been seen for an appointment in the last year OR does the patient have an upcoming appointment? No.  Agent: Please be advised that RX refills may take up to 3 business days. We ask that you follow-up with your pharmacy.

## 2023-01-09 NOTE — Telephone Encounter (Signed)
Medication Refill - Medication: pregabalin (LYRICA) 50 MG capsule  Has the patient contacted their pharmacy? Yes.   No more refills  Preferred Pharmacy (with phone number or street name): [facility] [address] [place] - [address]  Phone: [phone] Fax: [phone]  Has the patient been seen for an appointment in the last year OR does the patient have an upcoming appointment? No.  Agent: Please be advised that RX refills may take up to 3 business days. We ask that you follow-up with your pharmacy.

## 2023-01-10 NOTE — Telephone Encounter (Signed)
Please review.  KP

## 2023-01-10 NOTE — Telephone Encounter (Signed)
Requested medication (s) are due for refill today- yes  Requested medication (s) are on the active medication list -yes  Future visit scheduled -yes  Last refill: 09/11/22 #240 2RF  Notes to clinic: non delegated Rx  Requested Prescriptions  Pending Prescriptions Disp Refills   traMADol (ULTRAM) 50 MG tablet 240 tablet 2    Sig: Take 2 tablets (100 mg total) by mouth every 6 (six) hours as needed.     Not Delegated - Analgesics:  Opioid Agonists Failed - 01/09/2023  9:03 AM      Failed - This refill cannot be delegated      Failed - Urine Drug Screen completed in last 360 days      Passed - Valid encounter within last 3 months    Recent Outpatient Visits           2 months ago Essential (primary) hypertension   [place] [facility] at [facility], Nyoka Cowden, MD   6 months ago Type II diabetes mellitus with complication [facility])   [place] [facility] at [facility], Nyoka Cowden, MD   10 months ago Annual physical exam   [facility] at [facility], Nyoka Cowden, MD   1 year ago Essential (primary) hypertension   [place] [facility] at [facility], Nyoka Cowden, MD   1 year ago Type 2 diabetes mellitus with stage 3a chronic kidney disease, without long-term current use of insulin [facility])   [place] [facility] at [facility], Nyoka Cowden, MD       Future Appointments             In 1 month Reubin Milan, MD [facility] at [facility], [facility]               Requested Prescriptions  Pending Prescriptions Disp Refills   traMADol (ULTRAM) 50 MG tablet 240 tablet 2    Sig: Take 2 tablets (100 mg total) by mouth every 6 (six) hours as needed.     Not Delegated - Analgesics:  Opioid Agonists Failed - 01/09/2023  9:03 AM      Failed - This refill cannot be delegated       Failed - Urine Drug Screen completed in last 360 days      Passed - Valid encounter within last 3 months    Recent Outpatient Visits           2 months ago Essential (primary) hypertension   [place] [facility] at [facility], Nyoka Cowden, MD   6 months ago Type II diabetes mellitus with complication [facility])   [place] [facility] at [facility], Nyoka Cowden, MD   10 months ago Annual physical exam   [facility] at [facility], Nyoka Cowden, MD   1 year ago Essential (primary) hypertension   [place] [facility] Medicine at [facility], Nyoka Cowden, MD   1 year ago Type 2 diabetes mellitus with stage 3a chronic kidney disease, without long-term current use of insulin [facility])   [place] [facility] at [facility], Nyoka Cowden, MD       Future Appointments  In 1 month Judithann Graves, Nyoka Cowden, MD [facility] Medicine at [facility], [facility]

## 2023-01-10 NOTE — Telephone Encounter (Signed)
Requested medication (s) are due for refill today - yes  Requested medication (s) are on the active medication list -yes  Future visit scheduled -yes  Last refill: 04/14/23 #360 2RF  Notes to clinic: non delegated Rx  Requested Prescriptions  Pending Prescriptions Disp Refills   pregabalin (LYRICA) 50 MG capsule 360 capsule 2    Sig: Take 2 capsules (100 mg total) by mouth 4 (four) times daily. 2 in am; 1 at 1 pm; 1 at [facility]     Not Delegated - Neurology:  Anticonvulsants - Controlled - pregabalin Failed - 01/09/2023  9:04 AM      Failed - This refill cannot be delegated      Passed - Cr in normal range and within 360 days    Creatinine  Date Value Ref Range Status  05/29/2022 0.9 0.5 - 1.1 Final   Creatinine, Ser  Date Value Ref Range Status  02/20/2022 1.06 (H) 0.57 - 1.00 mg/dL Final         Passed - Completed PHQ-2 or PHQ-9 in the last 360 days      Passed - Valid encounter within last 12 months    Recent Outpatient Visits           2 months ago Essential (primary) hypertension   [place] [facility] at [facility], Nyoka Cowden, MD   6 months ago Type II diabetes mellitus with complication [facility])   [place] [facility] at [facility], Nyoka Cowden, MD   10 months ago Annual physical exam   [facility] at [facility], Nyoka Cowden, MD   1 year ago Essential (primary) hypertension   [place] [facility] at [facility], Nyoka Cowden, MD   1 year ago Type 2 diabetes mellitus with stage 3a chronic kidney disease, without long-term current use of insulin [facility])   [place] [facility] Medicine at [facility], Nyoka Cowden, MD       Future Appointments             In 1 month Reubin Milan, MD [facility] Medicine at [facility], [facility]               Requested Prescriptions  Pending  Prescriptions Disp Refills   pregabalin (LYRICA) 50 MG capsule 360 capsule 2    Sig: Take 2 capsules (100 mg total) by mouth 4 (four) times daily. 2 in am; 1 at 1 pm; 1 at [facility]     Not Delegated - Neurology:  Anticonvulsants - Controlled - pregabalin Failed - 01/09/2023  9:04 AM      Failed - This refill cannot be delegated      Passed - Cr in normal range and within 360 days    Creatinine  Date Value Ref Range Status  05/29/2022 0.9 0.5 - 1.1 Final   Creatinine, Ser  Date Value Ref Range Status  02/20/2022 1.06 (H) 0.57 - 1.00 mg/dL Final         Passed - Completed PHQ-2 or PHQ-9 in the last 360 days      Passed - Valid encounter within last 12 months    Recent Outpatient Visits           2 months ago Essential (primary) hypertension   [place] [facility] at [facility], Nyoka Cowden, MD  6 months ago Type II diabetes mellitus with complication [facility])   [place] [facility] at [facility], Nyoka Cowden, MD   10 months ago Annual physical exam   [facility] at [facility], Nyoka Cowden, MD   1 year ago Essential (primary) hypertension   [place] [facility] Medicine at [facility], Nyoka Cowden, MD   1 year ago Type 2 diabetes mellitus with stage 3a chronic kidney disease, without long-term current use of insulin [facility])   [place] [facility] Sports Medicine at [facility], Nyoka Cowden, MD       Future Appointments             In 1 month Judithann Graves, Nyoka Cowden, MD [facility] Medicine at [facility], [facility]

## 2023-01-11 MED ORDER — TRAMADOL HCL 50 MG PO TABS
100.0000 mg | ORAL_TABLET | Freq: Four times a day (QID) | ORAL | 0 refills | Status: DC | PRN
Start: 2023-01-11 — End: 2023-02-25

## 2023-01-11 MED ORDER — PREGABALIN 50 MG PO CAPS: 100.0000 mg | ORAL_CAPSULE | Freq: Four times a day (QID) | ORAL | 1 refills | Status: DC

## 2023-02-04 DIAGNOSIS — E039 Hypothyroidism, unspecified: Secondary | ICD-10-CM | POA: Diagnosis not present

## 2023-02-06 DIAGNOSIS — M5416 Radiculopathy, lumbar region: Secondary | ICD-10-CM | POA: Diagnosis not present

## 2023-02-06 DIAGNOSIS — M48062 Spinal stenosis, lumbar region with neurogenic claudication: Secondary | ICD-10-CM | POA: Diagnosis not present

## 2023-02-11 DIAGNOSIS — E039 Hypothyroidism, unspecified: Secondary | ICD-10-CM | POA: Diagnosis not present

## 2023-02-11 DIAGNOSIS — E278 Other specified disorders of adrenal gland: Secondary | ICD-10-CM | POA: Diagnosis not present

## 2023-02-14 DIAGNOSIS — Z872 Personal history of diseases of the skin and subcutaneous tissue: Secondary | ICD-10-CM | POA: Diagnosis not present

## 2023-02-14 DIAGNOSIS — L570 Actinic keratosis: Secondary | ICD-10-CM | POA: Diagnosis not present

## 2023-02-14 DIAGNOSIS — L218 Other seborrheic dermatitis: Secondary | ICD-10-CM | POA: Diagnosis not present

## 2023-02-14 DIAGNOSIS — Z85820 Personal history of malignant melanoma of skin: Secondary | ICD-10-CM | POA: Diagnosis not present

## 2023-02-14 DIAGNOSIS — L578 Other skin changes due to chronic exposure to nonionizing radiation: Secondary | ICD-10-CM | POA: Diagnosis not present

## 2023-02-14 DIAGNOSIS — L308 Other specified dermatitis: Secondary | ICD-10-CM | POA: Diagnosis not present

## 2023-02-16 IMAGING — CT CT KNEE*R* W/O CM
1 of 3 series · 6 of 14 positions shown, 8 images · non-contrast
Comparison: MR knee 05/10/2021.

CLINICAL DATA: Right knee pain.

EXAM:
CT OF THE RIGHT KNEE WITHOUT CONTRAST
TECHNIQUE: Multidetector CT imaging of the right knee was performed according
to the MY Knee protocol. Multiplanar CT image reconstructions were
also generated.
RADIATION DOSE REDUCTION: This exam was performed according to the
departmental dose-optimization program which includes automated
exposure control, adjustment of the mA and/or kV according to
patient size and/or use of iterative reconstruction technique.

[Series 10: axial st · axial · 0.39mm/px · z∈[+486,+682]mm · 6 of 276 slices shown, 8 images]
[im 40/276  soft-tissue]
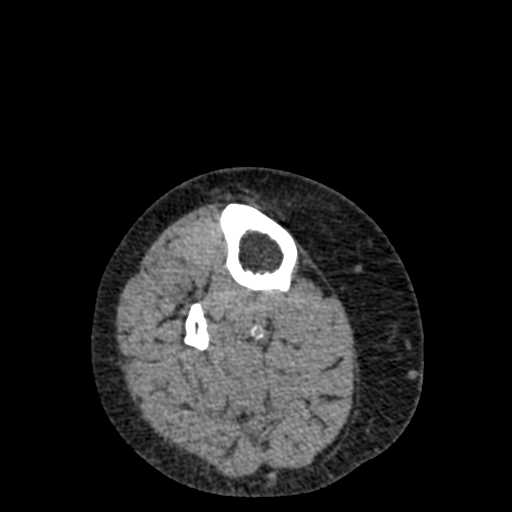
[im 40/276  bone]
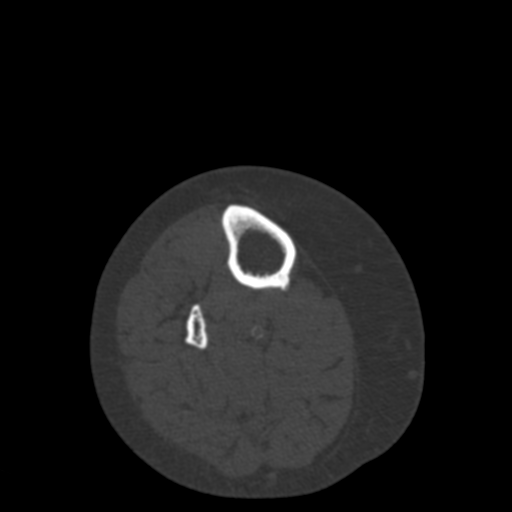
[im 79/276  bone]
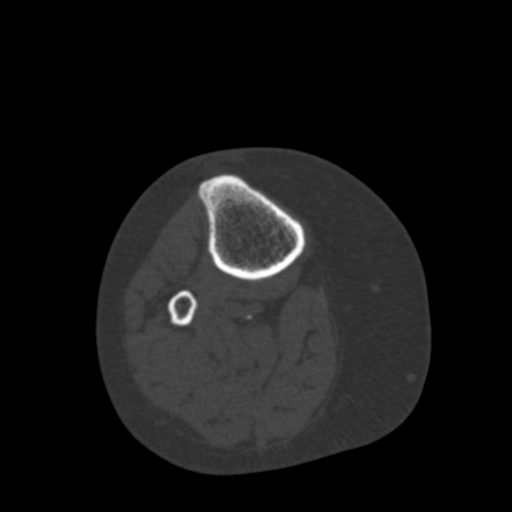
[im 118/276  bone]
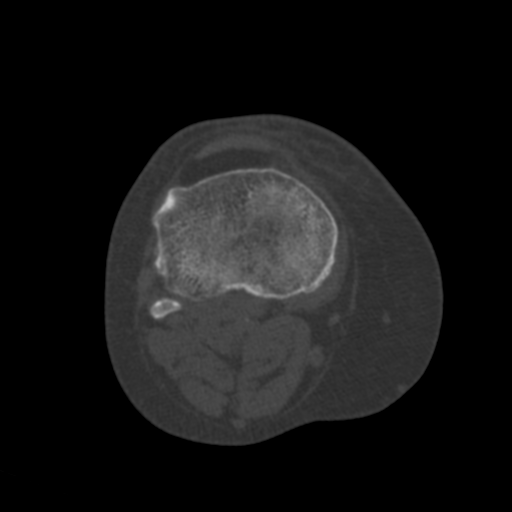
[im 158/276  bone]
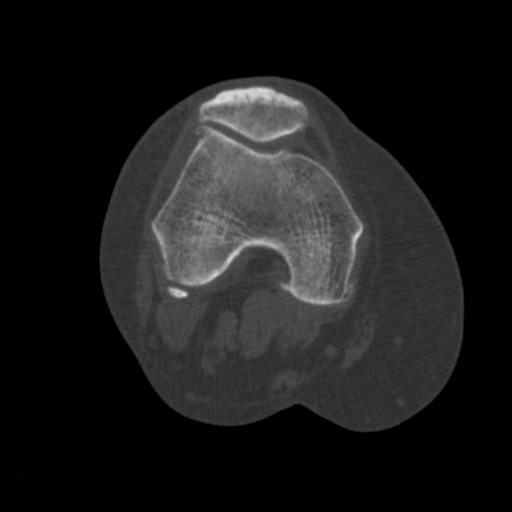
[im 197/276  soft-tissue]
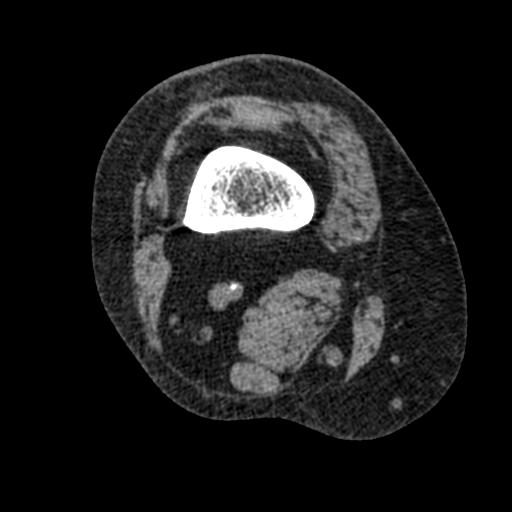
[im 197/276  bone]
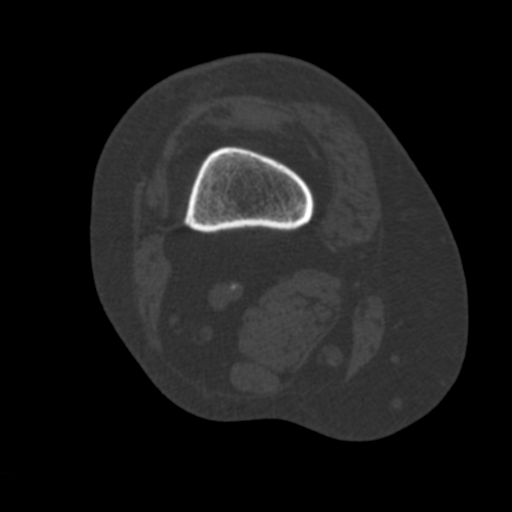
[im 236/276  bone]
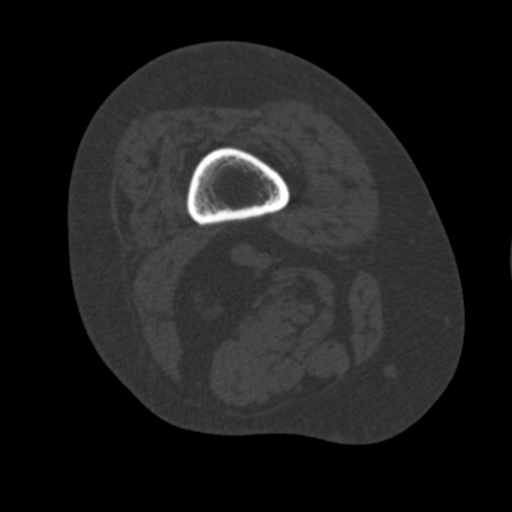

[6 of 14 positions shown; findings below may reference images not displayed]

FINDINGS: Bones/Joint/Cartilage

Right knee: No acute fracture or dislocation. Moderate-advanced
tricompartmental osteoarthritis of the right knee, most pronounced
within the medial compartment with joint space narrowing,
subchondral sclerosis, and marginal osteophyte formation. Trace knee
joint effusion.

Right hip: No acute fracture or dislocation. Mild joint space
narrowing and small marginal osteophytes. No evidence of femoral
head avascular necrosis.

Right ankle: No acute fracture or dislocation. Mild degenerative
spurring within the midfoot. No advanced tibiotalar arthropathy is
evident on axial images.

Ligaments

Suboptimally assessed by CT.

Muscles and Tendons

Musculotendinous structures appear within normal limits by CT.
Intact extensor mechanism.

Soft tissues

Atherosclerotic vascular calcifications. Mild prepatellar
subcutaneous edema. No fluid collection.
IMPRESSION: Moderate-severe tricompartmental osteoarthritis of the right knee.

## 2023-02-19 ENCOUNTER — Other Ambulatory Visit: Payer: Self-pay | Admitting: Internal Medicine

## 2023-02-19 DIAGNOSIS — E1169 Type 2 diabetes mellitus with other specified complication: Secondary | ICD-10-CM

## 2023-02-20 ENCOUNTER — Other Ambulatory Visit: Payer: Self-pay | Admitting: Internal Medicine

## 2023-02-20 DIAGNOSIS — M170 Bilateral primary osteoarthritis of knee: Secondary | ICD-10-CM

## 2023-02-20 NOTE — Telephone Encounter (Signed)
Medication Refill - Medication: traMADol (ULTRAM) 50 MG tablet   Has the patient contacted their pharmacy? No/controlled substance?  Preferred Pharmacy (with phone number or street name):  [facility] #[id number] - [place], [place] - [address] AT [facility] OF [address] [facility] Phone: [phone]  Fax: [phone]     Has the patient been seen for an appointment in the last year OR does the patient have an upcoming appointment? yes  Agent: Please be advised that RX refills may take up to 3 business days. We ask that you follow-up with your pharmacy.

## 2023-02-21 NOTE — Telephone Encounter (Signed)
Requested medication (s) are due for refill today -yes  Requested medication (s) are on the active medication list -yes  Future visit scheduled -yes  Last refill: 01/11/23 #240  Notes to clinic: non delegated Rx  Requested Prescriptions  Pending Prescriptions Disp Refills   traMADol (ULTRAM) 50 MG tablet 240 tablet 0    Sig: Take 2 tablets (100 mg total) by mouth every 6 (six) hours as needed.     Not Delegated - Analgesics:  Opioid Agonists Failed - 02/20/2023  2:17 PM      Failed - This refill cannot be delegated      Failed - Urine Drug Screen completed in last 360 days      Failed - Valid encounter within last 3 months    Recent Outpatient Visits           4 months ago Essential (primary) hypertension   [place] [facility] at [facility], Nyoka Cowden, MD   8 months ago Type II diabetes mellitus with complication [facility])   [place] [facility] at [facility], Nyoka Cowden, MD   1 year ago Annual physical exam   [facility] at [facility], Nyoka Cowden, MD   1 year ago Essential (primary) hypertension   [place] [facility] at [facility], Nyoka Cowden, MD   1 year ago Type 2 diabetes mellitus with stage 3a chronic kidney disease, without long-term current use of insulin [facility])   [place] [facility] Medicine at [facility], Nyoka Cowden, MD       Future Appointments             In 4 days Reubin Milan, MD [facility] at [facility], [facility]               Requested Prescriptions  Pending Prescriptions Disp Refills   traMADol (ULTRAM) 50 MG tablet 240 tablet 0    Sig: Take 2 tablets (100 mg total) by mouth every 6 (six) hours as needed.     Not Delegated - Analgesics:  Opioid Agonists Failed - 02/20/2023  2:17 PM      Failed - This refill cannot be delegated      Failed -  Urine Drug Screen completed in last 360 days      Failed - Valid encounter within last 3 months    Recent Outpatient Visits           4 months ago Essential (primary) hypertension   [place] [facility] at [facility], Nyoka Cowden, MD   8 months ago Type II diabetes mellitus with complication [facility])   [place] [facility] Medicine at [facility], Nyoka Cowden, MD   1 year ago Annual physical exam   [facility] at [facility], Nyoka Cowden, MD   1 year ago Essential (primary) hypertension   [place] [facility] Medicine at [facility], Nyoka Cowden, MD   1 year ago Type 2 diabetes mellitus with stage 3a chronic kidney disease, without long-term current use of insulin [facility])    Primary Care & Sports Medicine at [facility], Nyoka Cowden, MD       Future Appointments             In  4 days Reubin Milan, MD [facility] Primary Care & Sports Medicine at [facility], [facility]

## 2023-02-21 NOTE — Telephone Encounter (Signed)
Please review.  KP

## 2023-02-25 ENCOUNTER — Ambulatory Visit (INDEPENDENT_AMBULATORY_CARE_PROVIDER_SITE_OTHER): Payer: Medicare Other | Admitting: Internal Medicine

## 2023-02-25 ENCOUNTER — Encounter: Payer: Self-pay | Admitting: Internal Medicine

## 2023-02-25 VITALS — BP 126/76 | HR 79 | Ht 68.0 in | Wt 243.0 lb

## 2023-02-25 DIAGNOSIS — Z1231 Encounter for screening mammogram for malignant neoplasm of breast: Secondary | ICD-10-CM

## 2023-02-25 DIAGNOSIS — E1169 Type 2 diabetes mellitus with other specified complication: Secondary | ICD-10-CM | POA: Diagnosis not present

## 2023-02-25 DIAGNOSIS — E1142 Type 2 diabetes mellitus with diabetic polyneuropathy: Secondary | ICD-10-CM | POA: Diagnosis not present

## 2023-02-25 DIAGNOSIS — Z0000 Encounter for general adult medical examination without abnormal findings: Secondary | ICD-10-CM | POA: Diagnosis not present

## 2023-02-25 DIAGNOSIS — Z7984 Long term (current) use of oral hypoglycemic drugs: Secondary | ICD-10-CM | POA: Diagnosis not present

## 2023-02-25 DIAGNOSIS — Z1211 Encounter for screening for malignant neoplasm of colon: Secondary | ICD-10-CM | POA: Diagnosis not present

## 2023-02-25 DIAGNOSIS — N1831 Chronic kidney disease, stage 3a: Secondary | ICD-10-CM | POA: Diagnosis not present

## 2023-02-25 DIAGNOSIS — Z23 Encounter for immunization: Secondary | ICD-10-CM | POA: Diagnosis not present

## 2023-02-25 DIAGNOSIS — E118 Type 2 diabetes mellitus with unspecified complications: Secondary | ICD-10-CM

## 2023-02-25 DIAGNOSIS — E785 Hyperlipidemia, unspecified: Secondary | ICD-10-CM | POA: Diagnosis not present

## 2023-02-25 DIAGNOSIS — I10 Essential (primary) hypertension: Secondary | ICD-10-CM | POA: Diagnosis not present

## 2023-02-25 MED ORDER — TRAMADOL HCL 50 MG PO TABS: 100.0000 mg | ORAL_TABLET | Freq: Four times a day (QID) | ORAL | 0 refills | Status: DC | PRN

## 2023-02-25 NOTE — Patient Instructions (Signed)
Call [facility] to schedule your mammogram at [phone].  

## 2023-02-25 NOTE — Assessment & Plan Note (Signed)
Fair control of symptoms on Lyrica

## 2023-02-25 NOTE — Assessment & Plan Note (Signed)
BP controlled on Lisinopril.

## 2023-02-25 NOTE — Progress Notes (Signed)
Date:  02/25/2023   Name:  Kendra Walton   DOB:  06/07/1951   MRN:  [id number]   Chief Complaint: Annual Exam Kendra Walton is a 72 y.o. female who presents today for her Complete Annual Exam. She feels fairly well. She reports exercising- none. She reports she is sleeping well. Breast complaints - none.  Mammogram: 03/2022 DEXA: declined Colonoscopy: FIT 02/2022  Health Maintenance Due  Topic Date Due   DTaP/Tdap/Td (1 - Tdap) Never done   Colonoscopy  Never done   Lung Cancer Screening  Never done   Zoster Vaccines- Shingrix (1 of 2) Never done   COVID-19 Vaccine (5 - 2023-24 season) 02/17/2023   Diabetic kidney evaluation - Urine ACR  02/21/2023    Immunization History  Administered Date(s) Administered   Fluad Quad(high Dose 65+) 05/07/2019, 05/09/2020, 02/15/2021   Fluad Trivalent(High Dose 65+) 02/25/2023   Influenza, High Dose Seasonal PF 04/29/2018   Influenza,inj,Quad PF,6+ Mos 04/07/2015, 04/13/2016, 07/24/2017, 02/20/2022   Influenza-Unspecified 05/19/2014   Moderna Sars-Covid-2 Vaccination 06/23/2020   PFIZER(Purple Top)SARS-COV-2 Vaccination 08/16/2019, 09/07/2019, 06/15/2020   Pneumococcal Conjugate-13 08/08/2015   Pneumococcal Polysaccharide-23 12/25/2017    Hypertension This is a chronic problem. The problem is controlled. Pertinent negatives include no chest pain, headaches, palpitations or shortness of breath. Past treatments include ACE inhibitors and diuretics. The current treatment provides significant improvement. Hypertensive end-organ damage includes kidney disease. There is no history of CAD/MI or CVA.  Diabetes She presents for her follow-up diabetic visit. She has type 2 diabetes mellitus. Her disease course has been stable. Pertinent negatives for hypoglycemia include no dizziness, headaches, nervousness/anxiousness or tremors. Associated symptoms include foot paresthesias. Pertinent negatives for diabetes include no chest pain,  no fatigue, no polydipsia and no polyuria. Pertinent negatives for diabetic complications include no CVA. Current diabetic treatment includes oral agent (monotherapy).  Hyperlipidemia This is a chronic problem. Pertinent negatives include no chest pain or shortness of breath. Current antihyperlipidemic treatment includes statins.    Lab Results  Component Value Date   NA 139 05/29/2022   K 4.4 05/29/2022   CO2 26 (A) 05/29/2022   GLUCOSE 130 (H) 02/20/2022   BUN 17 05/29/2022   CREATININE 0.9 05/29/2022   CALCIUM 9.0 05/29/2022   EGFR 67 05/29/2022   GFRNONAA 56 (L) 01/05/2022   Lab Results  Component Value Date   CHOL 151 02/20/2022   HDL 52 02/20/2022   LDLCALC 72 02/20/2022   TRIG 156 (H) 02/20/2022   CHOLHDL 2.9 02/20/2022   Lab Results  Component Value Date   TSH 3.08 02/05/2022   Lab Results  Component Value Date   HGBA1C 5.7 (A) 10/22/2022   Lab Results  Component Value Date   WBC 14.5 05/29/2022   HGB 9.3 (A) 05/29/2022   HCT 29 (A) 05/29/2022   MCV 80 02/20/2022   PLT 271 05/29/2022   Lab Results  Component Value Date   ALT 17 02/20/2022   AST 16 02/20/2022   ALKPHOS 87 02/20/2022   BILITOT 0.3 02/20/2022   No results found for: "25OHVITD2", "25OHVITD3", "VD25OH"   Review of Systems  Constitutional:  Negative for chills, fatigue and fever.  HENT:  Negative for congestion, hearing loss, tinnitus, trouble swallowing and voice change.   Eyes:  Negative for visual disturbance.  Respiratory:  Negative for cough, chest tightness, shortness of breath and wheezing.   Cardiovascular:  Negative for chest pain, palpitations and leg swelling.  Gastrointestinal:  Negative for abdominal pain, constipation,  diarrhea and vomiting.  Endocrine: Negative for polydipsia and polyuria.  Genitourinary:  Negative for dysuria, frequency, genital sores, vaginal bleeding and vaginal discharge.  Musculoskeletal:  Negative for arthralgias, gait problem and joint swelling.   Skin:  Negative for color change and rash.  Neurological:  Negative for dizziness, tremors, light-headedness and headaches.  Hematological:  Negative for adenopathy. Does not bruise/bleed easily.  Psychiatric/Behavioral:  Negative for dysphoric mood and sleep disturbance. The patient is not nervous/anxious.     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   Obesity (BMI 30-39.9) 07/09/2018   Hearing decreased 12/13/2015   Hyperlipidemia associated with type 2 diabetes mellitus (HCC) 09/14/2015   History of breast cancer in female 04/07/2015   Acquired hypothyroidism 10/04/2014   Diabetic peripheral neuropathy (HCC) 10/04/2014   Essential (primary) hypertension 10/04/2014   DM (diabetes mellitus), type 2 with neurological complications (HCC) 10/04/2014    Allergies  Allergen Reactions   Shellfish Allergy Hives and Itching    Past Surgical History:  Procedure Laterality Date   ADRENALECTOMY Right 05/28/2022   APPENDECTOMY     CATARACT EXTRACTION W/ INTRAOCULAR LENS  IMPLANT, BILATERAL  2010   CESAREAN SECTION  1983   CHOLECYSTECTOMY  2005   ECTOPIC PREGNANCY SURGERY     LUMBAR LAMINECTOMY/DECOMPRESSION MICRODISCECTOMY Right 11/08/2021   Procedure: RIGHT L5-S1 MICRODISCECTOMY;  Surgeon: Venetia Night, MD;  Location: [facility];  Service: Neurosurgery;  Laterality: Right;   MASTECTOMY, PARTIAL Right 2010   REDUCTION MAMMAPLASTY Left 2011   RIGHT/LEFT HEART CATH AND CORONARY ANGIOGRAPHY Bilateral 08/06/2018   Procedure: RIGHT/LEFT HEART CATH AND CORONARY ANGIOGRAPHY;  Surgeon: Dalia Heading, MD;  Location: [facility]  [facility] LAB;  Service: Cardiovascular;  Laterality: Bilateral;   TOTAL KNEE ARTHROPLASTY Right 01/04/2022   Procedure: TOTAL KNEE ARTHROPLASTY;  Surgeon: Kennedy Bucker, MD;  Location: [facility] OR[facility];  Service: Orthopedics;  Laterality: Right;   TRANSCATHETER AORTIC VALVE REPLACEMENT, TRANSAPICAL  08/18/2018   Procedure: TRANSCATHETER AORTIC VALVE REPLACEMENT; Location: [facility]; Surgeon: Flint Melter, MD    Social History   Tobacco Use   Smoking status: Former    Current packs/day: 0.00    Average packs/day: 1 pack/day for 40.0 years (40.0 ttl pk-yrs)    Types: Cigarettes    Start date: 06/18/1972    Quit date: 06/18/2012    Years since quitting: 10.6   Smokeless tobacco: Never  Vaping Use   Vaping status: Never Used  Substance Use Topics   Alcohol use: No    Alcohol/week: 0.0 standard drinks of alcohol   Drug use: No     Medication list has been reviewed and updated.  Current Meds  Medication Sig   aspirin EC 81 MG tablet Take 1 tablet by mouth daily.   atorvastatin (LIPITOR) 20 MG tablet TAKE 1 TABLET(20 MG) BY MOUTH AT BEDTIME   clobetasol cream (TEMOVATE) 0.05 % Apply topically 2 (two) times daily.   docusate sodium (COLACE) 100 MG capsule Take 1 capsule (100 mg total) by mouth 2 (two) times daily.   Ferrous Gluconate (IRON 27 PO) Take by mouth.   glucose blood (ONE TOUCH ULTRA TEST) test strip Use to test BS twice a day  ketoconazole (NIZORAL) 2 % cream Apply topically 2 (two) times daily.   Lancets (ONETOUCH DELICA PLUS LANCET33G) MISC USE 1 EACH TWICE DAILY   levothyroxine (SYNTHROID) 150 MCG tablet Take 75-150 mcg by mouth See admin instructions. Take 150 mcg daily except Sundays take 75 mcg   lisinopril-hydrochlorothiazide (ZESTORETIC) 10-12.5 MG tablet TAKE 1 TABLET BY MOUTH DAILY   metFORMIN (GLUCOPHAGE-XR) 500 MG 24 hr tablet TAKE 2 TABLETS(1000 MG) BY MOUTH TWICE DAILY   Polyvinyl Alcohol-Povidone (REFRESH OP) Place 1 drop into both eyes daily.   pregabalin (LYRICA) 50 MG  capsule Take 2 capsules (100 mg total) by mouth 4 (four) times daily. 2 in am; 1 at 1 pm; 1 at HS   senna (SENOKOT) 8.6 MG TABS tablet Take 1 tablet (8.6 mg total) by mouth daily as needed for mild constipation.   vitamin B-12 (CYANOCOBALAMIN) 50 MCG tablet Take 50 mcg by mouth daily.   [DISCONTINUED] traMADol (ULTRAM) 50 MG tablet Take 2 tablets (100 mg total) by mouth every 6 (six) hours as needed.       02/25/2023    9:25 AM 10/22/2022   10:47 AM 06/22/2022   11:05 AM 02/20/2022    9:13 AM  GAD 7 : Generalized Anxiety Score  Nervous, Anxious, on Edge 1 0 1 1  Control/stop worrying 0 0 1 0  Worry too much - different things 1 0 0 0  Trouble relaxing 0 0 0 0  Restless 0 0 0 0  Easily annoyed or irritable 0 0 0 0  Afraid - awful might happen 1 0 0 0  Total GAD 7 Score 3 0 2 1  Anxiety Difficulty Not difficult at all Not difficult at all Not difficult at all Not difficult at all       02/25/2023    9:24 AM 10/22/2022   10:47 AM 06/22/2022   11:05 AM  Depression screen PHQ 2/9  Decreased Interest 0 0 0  Down, Depressed, Hopeless 0 0 0  PHQ - 2 Score 0 0 0  Altered sleeping 0 0 0  Tired, decreased energy 3 0 1  Change in appetite 2 1 1   Feeling bad or failure about yourself  0 0 1  Trouble concentrating 0 0 0  Moving slowly or fidgety/restless 0 0 0  Suicidal thoughts 0 0 0  PHQ-9 Score 5 1 3   Difficult doing work/chores Not difficult at all Not difficult at all Not difficult at all    BP Readings from Last 3 Encounters:  02/25/23 126/76  10/22/22 128/74  06/22/22 128/68    Physical Exam Vitals and nursing note reviewed.  Constitutional:      General: She is not in acute distress.    Appearance: She is well-developed.  HENT:     Head: Normocephalic and atraumatic.     Right Ear: Tympanic membrane and ear canal normal.     Left Ear: Tympanic membrane and ear canal normal.     Nose:     Right Sinus: No maxillary sinus tenderness.     Left Sinus: No maxillary sinus  tenderness.  Eyes:     General: No scleral icterus.       Right eye: No discharge.        Left eye: No discharge.     Conjunctiva/sclera: Conjunctivae normal.  Neck:     Thyroid: No thyromegaly.     Vascular: No carotid bruit.  Cardiovascular:     Rate and Rhythm: Normal rate and regular rhythm.  Pulses: Normal pulses.     Heart sounds: Normal heart sounds.  Pulmonary:     Effort: Pulmonary effort is normal. No respiratory distress.     Breath sounds: No wheezing.  Chest:  Breasts:    Right: No mass, nipple discharge, skin change or tenderness.     Left: No mass, nipple discharge, skin change or tenderness.  Abdominal:     General: Bowel sounds are normal.     Palpations: Abdomen is soft.     Tenderness: There is no abdominal tenderness.  Musculoskeletal:     Cervical back: Normal range of motion. No erythema.     Right lower leg: No edema.     Left lower leg: No edema.  Lymphadenopathy:     Cervical: No cervical adenopathy.  Skin:    General: Skin is warm and dry.     Findings: No rash.  Neurological:     Mental Status: She is alert and oriented to person, place, and time.     Cranial Nerves: No cranial nerve deficit.     Sensory: No sensory deficit.     Deep Tendon Reflexes: Reflexes are normal and symmetric.  Psychiatric:        Attention and Perception: Attention normal.        Mood and Affect: Mood normal.    Diabetic Foot Exam - Simple   Simple Foot Form Diabetic Foot exam was performed with the following findings: Yes 02/25/2023  9:45 AM  Visual Inspection No deformities, no ulcerations, no other skin breakdown bilaterally: Yes Sensation Testing See comments: Yes Pulse Check Posterior Tibialis and Dorsalis pulse intact bilaterally: Yes Comments Decreased sensation and painful neuropathy      Wt Readings from Last 3 Encounters:  02/25/23 243 lb (110.2 kg)  10/22/22 236 lb 6.4 oz (107.2 kg)  06/22/22 237 lb (107.5 kg)    BP 126/76   Pulse 79    Ht 5\' 8"  (1.727 m)   Wt 243 lb (110.2 kg)   SpO2 97%   BMI 36.95 kg/m   Assessment and Plan:  Problem List Items Addressed This Visit       Unprioritized   Type II diabetes mellitus with complication (HCC) (Chronic)    Blood sugars stable without hypoglycemic symptoms or events. Current regimen is metformin. Changes made last visit are none. Lab Results  Component Value Date   HGBA1C 5.7 (A) 10/22/2022          Relevant Orders   Comprehensive metabolic panel   Hemoglobin A1c   Microalbumin / creatinine urine ratio   Hyperlipidemia associated with type 2 diabetes mellitus (HCC) (Chronic)    LDL is  Lab Results  Component Value Date   LDLCALC 72 02/20/2022   Currently being treated with atorvastatin with good compliance and no concerns.       Relevant Orders   Lipid panel   Essential (primary) hypertension (Chronic)    BP controlled on Lisinopril.      Relevant Orders   CBC with Differential/Platelet   Comprehensive metabolic panel   Diabetic peripheral neuropathy (HCC) (Chronic)    Fair control of symptoms on Lyrica      Relevant Medications   traMADol (ULTRAM) 50 MG tablet   Chronic kidney disease, stage 3a (HCC)    Mild decrease in GFR being monitored      Relevant Orders   Comprehensive metabolic panel   Other Visit Diagnoses     Annual physical exam    -  Primary  immunizations up to date continue work on [employer]   Encounter for screening mammogram for breast cancer       Relevant Orders   MM 3D SCREENING MAMMOGRAM UNILATERAL LEFT BREAST   Colon cancer screening       Relevant Orders   Fecal occult blood, imunochemical   Long term current use of oral hypoglycemic drug       Need for influenza vaccination       Relevant Orders   Flu Vaccine Trivalent High Dose (Fluad) (Completed)       Return in about 4 months (around 06/27/2023) for DM, HTN.    Reubin Milan, MD [facility] Primary Care and Sports Medicine  [place]

## 2023-02-25 NOTE — Assessment & Plan Note (Signed)
Mild decrease in GFR being monitored

## 2023-02-25 NOTE — Assessment & Plan Note (Signed)
LDL is  Lab Results  Component Value Date   LDLCALC 72 02/20/2022   Currently being treated with atorvastatin with good compliance and no concerns.

## 2023-02-25 NOTE — Assessment & Plan Note (Signed)
Blood sugars stable without hypoglycemic symptoms or events. Current regimen is metformin. Changes made last visit are none. Lab Results  Component Value Date   HGBA1C 5.7 (A) 10/22/2022

## 2023-02-26 LAB — COMPREHENSIVE METABOLIC PANEL
ALT: 16 IU/L (ref 0–32)
AST: 17 IU/L (ref 0–40)
Albumin: 4.2 g/dL (ref 3.8–4.8)
Alkaline Phosphatase: 83 IU/L (ref 44–121)
BUN/Creatinine Ratio: 22 (ref 12–28)
BUN: 35 mg/dL — ABNORMAL HIGH (ref 8–27)
Bilirubin Total: 0.3 mg/dL (ref 0.0–1.2)
CO2: 20 mmol/L (ref 20–29)
Calcium: 9.7 mg/dL (ref 8.7–10.3)
Chloride: 103 mmol/L (ref 96–106)
Creatinine, Ser: 1.61 mg/dL — ABNORMAL HIGH (ref 0.57–1.00)
Globulin, Total: 3 g/dL (ref 1.5–4.5)
Glucose: 102 mg/dL — ABNORMAL HIGH (ref 70–99)
Potassium: 6.1 mmol/L — ABNORMAL HIGH (ref 3.5–5.2)
Sodium: 138 mmol/L (ref 134–144)
Total Protein: 7.2 g/dL (ref 6.0–8.5)
eGFR: 33 mL/min/{1.73_m2} — ABNORMAL LOW (ref >59)

## 2023-02-26 LAB — CBC WITH DIFFERENTIAL/PLATELET
Basophils Absolute: 0.1 10*3/uL (ref 0.0–0.2)
Basos: 1 %
EOS (ABSOLUTE): 0.3 10*3/uL (ref 0.0–0.4)
Eos: 3 %
Hematocrit: 33.7 % — ABNORMAL LOW (ref 34.0–46.6)
Hemoglobin: 10.7 g/dL — ABNORMAL LOW (ref 11.1–15.9)
Immature Grans (Abs): 0 10*3/uL (ref 0.0–0.1)
Immature Granulocytes: 0 %
Lymphocytes Absolute: 3.1 10*3/uL (ref 0.7–3.1)
Lymphs: 28 %
MCH: 26.5 pg — ABNORMAL LOW (ref 26.6–33.0)
MCHC: 31.8 g/dL (ref 31.5–35.7)
MCV: 83 fL (ref 79–97)
Monocytes Absolute: 1 10*3/uL — ABNORMAL HIGH (ref 0.1–0.9)
Monocytes: 9 %
Neutrophils Absolute: 6.7 10*3/uL (ref 1.4–7.0)
Neutrophils: 59 %
Platelets: 300 10*3/uL (ref 150–450)
RBC: 4.04 x10E6/uL (ref 3.77–5.28)
RDW: 13.1 % (ref 11.7–15.4)
WBC: 11.2 10*3/uL — ABNORMAL HIGH (ref 3.4–10.8)

## 2023-02-26 LAB — LIPID PANEL
Chol/HDL Ratio: 3.8 ratio (ref 0.0–4.4)
Cholesterol, Total: 171 mg/dL (ref 100–199)
HDL: 45 mg/dL (ref >39)
LDL Chol Calc (NIH): 91 mg/dL (ref 0–99)
Triglycerides: 209 mg/dL — ABNORMAL HIGH (ref 0–149)
VLDL Cholesterol Cal: 35 mg/dL (ref 5–40)

## 2023-02-26 LAB — MICROALBUMIN / CREATININE URINE RATIO
Creatinine, Urine: 146.4 mg/dL
Microalb/Creat Ratio: 9 mg/g{creat} (ref 0–29)
Microalbumin, Urine: 12.9 ug/mL

## 2023-02-26 LAB — HEMOGLOBIN A1C
Est. average glucose Bld gHb Est-mCnc: 126 mg/dL
Hgb A1c MFr Bld: 6 % — ABNORMAL HIGH (ref 4.8–5.6)

## 2023-02-27 DIAGNOSIS — M48062 Spinal stenosis, lumbar region with neurogenic claudication: Secondary | ICD-10-CM | POA: Diagnosis not present

## 2023-02-27 DIAGNOSIS — M5416 Radiculopathy, lumbar region: Secondary | ICD-10-CM | POA: Diagnosis not present

## 2023-03-18 ENCOUNTER — Other Ambulatory Visit: Payer: Self-pay | Admitting: Internal Medicine

## 2023-03-18 DIAGNOSIS — N1831 Chronic kidney disease, stage 3a: Secondary | ICD-10-CM

## 2023-03-27 DIAGNOSIS — Z1211 Encounter for screening for malignant neoplasm of colon: Secondary | ICD-10-CM | POA: Diagnosis not present

## 2023-03-31 LAB — FECAL OCCULT BLOOD, IMMUNOCHEMICAL: Fecal Occult Bld: NEGATIVE

## 2023-04-08 ENCOUNTER — Other Ambulatory Visit: Payer: Self-pay | Admitting: Internal Medicine

## 2023-04-08 ENCOUNTER — Inpatient Hospital Stay: Admission: RE | Admit: 2023-04-08 | Payer: Medicare Other | Source: Ambulatory Visit

## 2023-04-08 DIAGNOSIS — E1142 Type 2 diabetes mellitus with diabetic polyneuropathy: Secondary | ICD-10-CM

## 2023-04-08 MED ORDER — TRAMADOL HCL 50 MG PO TABS: 100.0000 mg | ORAL_TABLET | Freq: Four times a day (QID) | ORAL | 1 refills | Status: DC | PRN

## 2023-04-08 NOTE — Telephone Encounter (Signed)
Please review.  KP

## 2023-04-11 ENCOUNTER — Ambulatory Visit
Admission: RE | Admit: 2023-04-11 | Discharge: 2023-04-11 | Disposition: A | Discharge status: Home / Self Care | Payer: Medicare Other | Source: Ambulatory Visit | Attending: Internal Medicine | Admitting: Internal Medicine

## 2023-04-11 DIAGNOSIS — Z1231 Encounter for screening mammogram for malignant neoplasm of breast: Secondary | ICD-10-CM | POA: Diagnosis not present

## 2023-05-26 ENCOUNTER — Other Ambulatory Visit: Payer: Self-pay | Admitting: Internal Medicine

## 2023-05-26 DIAGNOSIS — I10 Essential (primary) hypertension: Secondary | ICD-10-CM

## 2023-05-28 NOTE — Telephone Encounter (Signed)
Provider aware of abnormal labs. Requested Prescriptions  Pending Prescriptions Disp Refills   lisinopril-hydrochlorothiazide (ZESTORETIC) 10-12.5 MG tablet [Pharmacy Med Name: LISINOPRIL-HCTZ 10/12.5MG  TABLETS] 90 tablet 1    Sig: TAKE 1 TABLET BY MOUTH DAILY     Cardiovascular:  ACEI + Diuretic Combos Failed - 05/26/2023  9:04 AM      Failed - K in normal range and within 180 days    Potassium  Date Value Ref Range Status  02/25/2023 6.1 (H) 3.5 - 5.2 mmol/L Final         Failed - Cr in normal range and within 180 days    Creatinine, Ser  Date Value Ref Range Status  02/25/2023 1.61 (H) 0.57 - 1.00 mg/dL Final         Passed - Na in normal range and within 180 days    Sodium  Date Value Ref Range Status  02/25/2023 138 134 - 144 mmol/L Final         Passed - eGFR is 30 or above and within 180 days    GFR calc Af Amer  Date Value Ref Range Status  05/09/2020 65 >59 mL/min/1.73 Final    Comment:    **In accordance with recommendations from the NKF-ASN Task force,**   [facility] is in the process of updating its eGFR calculation to the   2021 CKD-EPI creatinine equation that estimates kidney function   without a race variable.    GFR, Estimated  Date Value Ref Range Status  01/05/2022 56 (L) >60 mL/min Final    Comment:    (NOTE) Calculated using the CKD-EPI Creatinine Equation (2021)    eGFR  Date Value Ref Range Status  02/25/2023 33 (L) >59 mL/min/1.73 Final         Passed - Patient is not pregnant      Passed - Last BP in normal range    BP Readings from Last 1 Encounters:  02/25/23 126/76         Passed - Valid encounter within last 6 months    Recent Outpatient Visits           3 months ago Annual physical exam   [place] [facility] at [facility], Nyoka Cowden, MD   7 months ago Essential (primary) hypertension   [place] [facility] Medicine at [facility], Nyoka Cowden, MD   11 months ago Type  II diabetes mellitus with complication [facility]   [place] [facility] Medicine at [facility], Nyoka Cowden, MD   1 year ago Annual physical exam   [facility] at [facility], Nyoka Cowden, MD   1 year ago Essential (primary) hypertension   [place] [facility] Sports Medicine at [facility], Nyoka Cowden, MD       Future Appointments             In 1 month Judithann Graves, Nyoka Cowden, MD [facility] Primary Care & Sports Medicine at [facility], [facility]

## 2023-07-01 ENCOUNTER — Encounter: Payer: Self-pay | Admitting: Internal Medicine

## 2023-07-01 ENCOUNTER — Ambulatory Visit: Payer: Medicare Other | Admitting: Internal Medicine

## 2023-07-01 ENCOUNTER — Ambulatory Visit (INDEPENDENT_AMBULATORY_CARE_PROVIDER_SITE_OTHER): Payer: Medicare Other | Admitting: Internal Medicine

## 2023-07-01 VITALS — BP 116/62 | HR 87 | Ht 68.0 in | Wt 251.0 lb

## 2023-07-01 DIAGNOSIS — Z7984 Long term (current) use of oral hypoglycemic drugs: Secondary | ICD-10-CM | POA: Diagnosis not present

## 2023-07-01 DIAGNOSIS — E118 Type 2 diabetes mellitus with unspecified complications: Secondary | ICD-10-CM

## 2023-07-01 DIAGNOSIS — I10 Essential (primary) hypertension: Secondary | ICD-10-CM

## 2023-07-01 DIAGNOSIS — N1831 Chronic kidney disease, stage 3a: Secondary | ICD-10-CM | POA: Diagnosis not present

## 2023-07-01 DIAGNOSIS — E1142 Type 2 diabetes mellitus with diabetic polyneuropathy: Secondary | ICD-10-CM | POA: Diagnosis not present

## 2023-07-01 MED ORDER — ONETOUCH DELICA PLUS LANCET33G MISC: 1.0000 | Freq: Four times a day (QID) | 3 refills | Status: DC | PRN

## 2023-07-01 MED ORDER — TRAMADOL HCL 50 MG PO TABS: 100.0000 mg | ORAL_TABLET | Freq: Four times a day (QID) | ORAL | 1 refills | Status: DC | PRN

## 2023-07-01 MED ORDER — GLUCOSE BLOOD VI STRP: ORAL_STRIP | 12 refills | Status: DC

## 2023-07-01 MED ORDER — PREGABALIN 50 MG PO CAPS: ORAL_CAPSULE | ORAL | 1 refills | Status: DC

## 2023-07-01 NOTE — Assessment & Plan Note (Signed)
 Neuropathic foot pain is slightly worse at night now. 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 Will increase Rx to 2 tabs in AM,  1 tab at 1 PM and 2 tabs at HS

## 2023-07-01 NOTE — Assessment & Plan Note (Signed)
 GFR was significantly reduced on last check. Recommended repeat in one month and refer to Nephrology but patient never had labs done.

## 2023-07-01 NOTE — Progress Notes (Signed)
 Date:  07/01/2023   Name:  Kendra Walton   DOB:  Nov 22, 1950   MRN:  [id number]   Chief Complaint: Diabetes and Hypertension  Hypertension This is a chronic problem. The problem is controlled. Pertinent negatives include no chest pain, headaches, palpitations or shortness of breath. Past treatments include ACE inhibitors.  Diabetes She presents for her follow-up diabetic visit. She has type 2 diabetes mellitus. Her disease course has been stable. Pertinent negatives for hypoglycemia include no headaches or tremors. Pertinent negatives for diabetes include no chest pain, no fatigue, no polydipsia and no polyuria. Current diabetic treatment includes oral agent (monotherapy) (MTF).  CKD - worse last check.  She feels well.   Review of Systems  Constitutional:  Negative for appetite change, fatigue, fever and unexpected weight change.  HENT:  Negative for tinnitus and trouble swallowing.   Eyes:  Negative for visual disturbance.  Respiratory:  Negative for cough, chest tightness and shortness of breath.   Cardiovascular:  Negative for chest pain, palpitations and leg swelling.  Gastrointestinal:  Negative for abdominal pain.  Endocrine: Negative for polydipsia and polyuria.  Genitourinary:  Negative for dysuria and hematuria.  Musculoskeletal:  Negative for arthralgias.  Neurological:  Negative for tremors, numbness and headaches.  Psychiatric/Behavioral:  Negative for dysphoric mood.      Lab Results  Component Value Date   NA 138 02/25/2023   K 6.1 (H) 02/25/2023   CO2 20 02/25/2023   GLUCOSE 102 (H) 02/25/2023   BUN 35 (H) 02/25/2023   CREATININE 1.61 (H) 02/25/2023   CALCIUM  9.7 02/25/2023   EGFR 33 (L) 02/25/2023   GFRNONAA 56 (L) 01/05/2022   Lab Results  Component Value Date   CHOL 171 02/25/2023   HDL 45 02/25/2023   LDLCALC 91 02/25/2023   TRIG 209 (H) 02/25/2023   CHOLHDL 3.8 02/25/2023   Lab Results  Component Value Date   TSH 3.08 02/05/2022    Lab Results  Component Value Date   HGBA1C 6.0 (H) 02/25/2023   Lab Results  Component Value Date   WBC 11.2 (H) 02/25/2023   HGB 10.7 (L) 02/25/2023   HCT 33.7 (L) 02/25/2023   MCV 83 02/25/2023   PLT 300 02/25/2023   Lab Results  Component Value Date   ALT 16 02/25/2023   AST 17 02/25/2023   ALKPHOS 83 02/25/2023   BILITOT 0.3 02/25/2023   No results found for: MARIEN BOLLS, VD25OH   Patient Active Problem List   Diagnosis Date Noted   Obesity, morbid (HCC) 07/01/2023   Tobacco use disorder, moderate, in sustained remission 06/22/2022   Chronic kidney disease, stage 3a (HCC) 02/20/2022   S/P TKR (total knee replacement) using cement, right 01/04/2022   Type II diabetes mellitus with complication (HCC) 06/20/2021   Aortic atherosclerosis (HCC) 01/03/2020   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   Hearing decreased 12/13/2015   Hyperlipidemia associated with type 2 diabetes mellitus (HCC) 09/14/2015   History of breast cancer in female 04/07/2015   Acquired hypothyroidism 10/04/2014   Diabetic peripheral neuropathy (HCC) 10/04/2014   Essential (primary) hypertension 10/04/2014   DM (diabetes mellitus), type 2 with neurological complications (HCC) 10/04/2014    Allergies  Allergen Reactions   Shellfish Allergy Hives and Itching    Past Surgical History:  Procedure Laterality Date   ADRENALECTOMY Right 05/28/2022   APPENDECTOMY     CATARACT EXTRACTION W/ INTRAOCULAR LENS  IMPLANT, BILATERAL  2010   CESAREAN SECTION  1983  CHOLECYSTECTOMY  2005   ECTOPIC PREGNANCY SURGERY     LUMBAR LAMINECTOMY/DECOMPRESSION MICRODISCECTOMY Right 11/08/2021   Procedure: RIGHT L5-S1 MICRODISCECTOMY;  Surgeon: Clois Fret, MD;  Location: [facility];  Service: Neurosurgery;  Laterality: Right;   MASTECTOMY, PARTIAL Right 2010   REDUCTION MAMMAPLASTY Left 2011   RIGHT/LEFT HEART CATH AND CORONARY ANGIOGRAPHY Bilateral 08/06/2018    Procedure: RIGHT/LEFT HEART CATH AND CORONARY ANGIOGRAPHY;  Surgeon: Bosie Vinie LABOR, MD;  Location: [facility];  Service: Cardiovascular;  Laterality: Bilateral;   TOTAL KNEE ARTHROPLASTY Right 01/04/2022   Procedure: TOTAL KNEE ARTHROPLASTY;  Surgeon: Kathlynn Sharper, MD;  Location: [facility];  Service: Orthopedics;  Laterality: Right;   TRANSCATHETER AORTIC VALVE REPLACEMENT, TRANSAPICAL  08/18/2018   Procedure: TRANSCATHETER AORTIC VALVE REPLACEMENT; Location: [facility]; Surgeon: Bernardino Lares, MD    Social History   Tobacco Use   Smoking status: Former    Current packs/day: 0.00    Average packs/day: 1 pack/day for 40.0 years (40.0 ttl pk-yrs)    Types: Cigarettes    Start date: 06/18/1972    Quit date: 06/18/2012    Years since quitting: 11.0   Smokeless tobacco: Never  Vaping Use   Vaping status: Never Used  Substance Use Topics   Alcohol  use: No    Alcohol /week: 0.0 standard drinks of alcohol    Drug use: No     Medication list has been reviewed and updated.  Current Meds  Medication Sig   aspirin  EC 81 MG tablet Take 1 tablet by mouth daily.   atorvastatin  (LIPITOR) 20 MG tablet TAKE 1 TABLET(20 MG) BY MOUTH AT BEDTIME   clobetasol cream (TEMOVATE) 0.05 % Apply topically 2 (two) times daily.   docusate sodium  (COLACE) 100 MG capsule Take 1 capsule (100 mg total) by mouth 2 (two) times daily.   ketoconazole (NIZORAL) 2 % cream Apply topically 2 (two) times daily.   levothyroxine  (SYNTHROID ) 150 MCG tablet Take 75-150 mcg by mouth See admin instructions. Take 150 mcg daily except Sundays take 75 mcg   lisinopril -hydrochlorothiazide  (ZESTORETIC ) 10-12.5 MG tablet TAKE 1 TABLET BY MOUTH DAILY   metFORMIN  (GLUCOPHAGE -XR) 500 MG 24 hr tablet TAKE 2 TABLETS(1000 MG) BY MOUTH TWICE DAILY   Polyvinyl Alcohol -Povidone (REFRESH OP) Place 1 drop into both eyes daily.   senna (SENOKOT) 8.6 MG TABS tablet Take 1 tablet (8.6 mg total) by mouth daily as needed for mild constipation.    [DISCONTINUED] glucose blood (ONE TOUCH ULTRA TEST) test strip Use to test BS twice a day   [DISCONTINUED] Lancets (ONETOUCH DELICA PLUS LANCET33G) MISC USE 1 EACH TWICE DAILY   [DISCONTINUED] pregabalin  (LYRICA ) 50 MG capsule Take 2 capsules (100 mg total) by mouth 4 (four) times daily. 2 in am; 1 at 1 pm; 1 at HS   [DISCONTINUED] traMADol  (ULTRAM ) 50 MG tablet Take 2 tablets (100 mg total) by mouth every 6 (six) hours as needed.       07/01/2023   11:17 AM 02/25/2023    9:25 AM 10/22/2022   10:47 AM 06/22/2022   11:05 AM  GAD 7 : Generalized Anxiety Score  Nervous, Anxious, on Edge 0 1 0 1  Control/stop worrying 0 0 0 1  Worry too much - different things 0 1 0 0  Trouble relaxing 0 0 0 0  Restless 0 0 0 0  Easily annoyed or irritable 0 0 0 0  Afraid - awful might happen 0 1 0 0  Total GAD 7 Score 0 3 0 2  Anxiety Difficulty Not difficult at all Not difficult at all Not difficult at all Not difficult at all       07/01/2023   11:16 AM 02/25/2023    9:24 AM 10/22/2022   10:47 AM  Depression screen PHQ 2/9  Decreased Interest 1 0 0  Down, Depressed, Hopeless 1 0 0  PHQ - 2 Score 2 0 0  Altered sleeping 0 0 0  Tired, decreased energy 0 3 0  Change in appetite 0 2 1  Feeling bad or failure about yourself  0 0 0  Trouble concentrating 0 0 0  Moving slowly or fidgety/restless 0 0 0  Suicidal thoughts 0 0 0  PHQ-9 Score 2 5 1   Difficult doing work/chores Not difficult at all Not difficult at all Not difficult at all    BP Readings from Last 3 Encounters:  07/01/23 116/62  02/25/23 126/76  10/22/22 128/74    Physical Exam Vitals and nursing note reviewed.  Constitutional:      General: She is not in acute distress.    Appearance: Normal appearance. She is well-developed.  HENT:     Head: Normocephalic and atraumatic.  Cardiovascular:     Rate and Rhythm: Normal rate and regular rhythm.     Pulses: Normal pulses.  Pulmonary:     Effort: Pulmonary effort is normal. No  respiratory distress.     Breath sounds: No wheezing or rhonchi.  Musculoskeletal:     Cervical back: Normal range of motion.     Right lower leg: No edema.     Left lower leg: No edema.  Skin:    General: Skin is warm and dry.     Findings: No rash.  Neurological:     General: No focal deficit present.     Mental Status: She is alert and oriented to person, place, and time.     Gait: Gait abnormal.  Psychiatric:        Mood and Affect: Mood normal.        Behavior: Behavior normal.     Wt Readings from Last 3 Encounters:  07/01/23 251 lb (113.9 kg)  02/25/23 243 lb (110.2 kg)  10/22/22 236 lb 6.4 oz (107.2 kg)    BP 116/62   Pulse 87   Ht 5' 8 (1.727 m)   Wt 251 lb (113.9 kg)   SpO2 97%   BMI 38.16 kg/m   Assessment and Plan:  Problem List Items Addressed This Visit       Unprioritized   Diabetic peripheral neuropathy (HCC) (Chronic)   Neuropathic foot pain is slightly worse at night now. 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 Will increase Rx to 2 tabs in AM,  1 tab at 1 PM and 2 tabs at HS      Relevant Medications   traMADol  (ULTRAM ) 50 MG tablet   pregabalin  (LYRICA ) 50 MG capsule   Essential (primary) hypertension (Chronic)   Controlled BP with normal exam. Current regimen is lisinopril . Will continue same medications; encourage continued reduced sodium diet.       Relevant Orders   CBC with Differential/Platelet   Type II diabetes mellitus with complication (HCC) (Chronic)   Blood sugars stable without hypoglycemic symptoms or events. Currently managed with metformin . Changes made last visit are none. Lab Results  Component Value Date   HGBA1C 6.0 (H) 02/25/2023         Relevant Medications   glucose blood (ONE TOUCH ULTRA TEST)  test strip   Lancets (ONETOUCH DELICA PLUS LANCET33G) MISC   Other Relevant Orders   Hemoglobin A1c   Chronic kidney disease, stage 3a (HCC) - Primary   GFR was significantly reduced on last  check. Recommended repeat in one month and refer to Nephrology but patient never had labs done.      Relevant Orders   Basic metabolic panel   Obesity, morbid (HCC)   Weight is gradually increasing. Continue to work on diet changes to avoid further increase.      Other Visit Diagnoses       Long term current use of oral hypoglycemic drug           Return in about 4 months (around 10/29/2023) for DM, HTN.    Leita HILARIO Adie, MD [facility] [place]

## 2023-07-01 NOTE — Assessment & Plan Note (Signed)
 Controlled BP with normal exam. Current regimen is lisinopril. Will continue same medications; encourage continued reduced sodium diet.

## 2023-07-01 NOTE — Assessment & Plan Note (Signed)
 Blood sugars stable without hypoglycemic symptoms or events. Currently managed with metformin. Changes made last visit are none. Lab Results  Component Value Date   HGBA1C 6.0 (H) 02/25/2023

## 2023-07-01 NOTE — Assessment & Plan Note (Signed)
 Weight is gradually increasing. Continue to work on diet changes to avoid further increase.

## 2023-07-02 LAB — CBC WITH DIFFERENTIAL/PLATELET
Basophils Absolute: 0.1 10*3/uL (ref 0.0–0.2)
Basos: 1 %
EOS (ABSOLUTE): 0.2 10*3/uL (ref 0.0–0.4)
Eos: 2 %
Hematocrit: 31.8 % — ABNORMAL LOW (ref 34.0–46.6)
Hemoglobin: 9.9 g/dL — ABNORMAL LOW (ref 11.1–15.9)
Immature Grans (Abs): 0 10*3/uL (ref 0.0–0.1)
Immature Granulocytes: 0 %
Lymphocytes Absolute: 2.9 10*3/uL (ref 0.7–3.1)
Lymphs: 28 %
MCH: 26.7 pg (ref 26.6–33.0)
MCHC: 31.1 g/dL — ABNORMAL LOW (ref 31.5–35.7)
MCV: 86 fL (ref 79–97)
Monocytes Absolute: 0.8 10*3/uL (ref 0.1–0.9)
Monocytes: 7 %
Neutrophils Absolute: 6.3 10*3/uL (ref 1.4–7.0)
Neutrophils: 62 %
Platelets: 269 10*3/uL (ref 150–450)
RBC: 3.71 x10E6/uL — ABNORMAL LOW (ref 3.77–5.28)
RDW: 12.5 % (ref 11.7–15.4)
WBC: 10.2 10*3/uL (ref 3.4–10.8)

## 2023-07-02 LAB — BASIC METABOLIC PANEL
BUN/Creatinine Ratio: 23 (ref 12–28)
BUN: 35 mg/dL — ABNORMAL HIGH (ref 8–27)
CO2: 21 mmol/L (ref 20–29)
Calcium: 9.4 mg/dL (ref 8.7–10.3)
Chloride: 104 mmol/L (ref 96–106)
Creatinine, Ser: 1.5 mg/dL — ABNORMAL HIGH (ref 0.57–1.00)
Glucose: 119 mg/dL — ABNORMAL HIGH (ref 70–99)
Potassium: 5.9 mmol/L — ABNORMAL HIGH (ref 3.5–5.2)
Sodium: 139 mmol/L (ref 134–144)
eGFR: 36 mL/min/{1.73_m2} — ABNORMAL LOW (ref >59)

## 2023-07-02 LAB — HEMOGLOBIN A1C
Est. average glucose Bld gHb Est-mCnc: 131 mg/dL
Hgb A1c MFr Bld: 6.2 % — ABNORMAL HIGH (ref 4.8–5.6)

## 2023-07-12 DIAGNOSIS — I10 Essential (primary) hypertension: Secondary | ICD-10-CM | POA: Diagnosis not present

## 2023-07-12 DIAGNOSIS — I7025 Atherosclerosis of native arteries of other extremities with ulceration: Secondary | ICD-10-CM | POA: Diagnosis not present

## 2023-07-12 DIAGNOSIS — N1831 Chronic kidney disease, stage 3a: Secondary | ICD-10-CM | POA: Diagnosis not present

## 2023-07-12 DIAGNOSIS — E1149 Type 2 diabetes mellitus with other diabetic neurological complication: Secondary | ICD-10-CM | POA: Diagnosis not present

## 2023-07-12 DIAGNOSIS — E782 Mixed hyperlipidemia: Secondary | ICD-10-CM | POA: Diagnosis not present

## 2023-07-19 ENCOUNTER — Encounter: Payer: Self-pay | Admitting: Internal Medicine

## 2023-07-25 ENCOUNTER — Other Ambulatory Visit: Payer: Self-pay | Admitting: Internal Medicine

## 2023-07-25 DIAGNOSIS — N1831 Chronic kidney disease, stage 3a: Secondary | ICD-10-CM

## 2023-07-25 NOTE — Progress Notes (Unsigned)
 Date:  07/25/2023   Name:  Kendra Walton   DOB:  [birth year]   MRN:  [id number]   Chief Complaint: No chief complaint on file.  HPI  Review of Systems   Lab Results  Component Value Date   NA 139 07/01/2023   K 5.9 (H) 07/01/2023   CO2 21 07/01/2023   GLUCOSE 119 (H) 07/01/2023   BUN 35 (H) 07/01/2023   CREATININE 1.50 (H) 07/01/2023   CALCIUM  9.4 07/01/2023   EGFR 36 (L) 07/01/2023   GFRNONAA 56 (L) 01/05/2022   Lab Results  Component Value Date   CHOL 171 02/25/2023   HDL 45 02/25/2023   LDLCALC 91 02/25/2023   TRIG 209 (H) 02/25/2023   CHOLHDL 3.8 02/25/2023   Lab Results  Component Value Date   TSH 3.08 02/05/2022   Lab Results  Component Value Date   HGBA1C 6.2 (H) 07/01/2023   Lab Results  Component Value Date   WBC 10.2 07/01/2023   HGB 9.9 (L) 07/01/2023   HCT 31.8 (L) 07/01/2023   MCV 86 07/01/2023   PLT 269 07/01/2023   Lab Results  Component Value Date   ALT 16 02/25/2023   AST 17 02/25/2023   ALKPHOS 83 02/25/2023   BILITOT 0.3 02/25/2023   No results found for: MARIEN BOLLS, VD25OH   Patient Active Problem List   Diagnosis Date Noted   Obesity, morbid (HCC) 07/01/2023   Tobacco use disorder, moderate, in sustained remission 06/22/2022   Chronic kidney disease, stage 3a (HCC) 02/20/2022   S/P TKR (total knee replacement) using cement, right 01/04/2022   Type II diabetes mellitus with complication (HCC) 06/20/2021   Aortic atherosclerosis (HCC) 01/03/2020   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   Hearing decreased 12/13/2015   Hyperlipidemia associated with type 2 diabetes mellitus (HCC) 09/14/2015   History of breast cancer in female 04/07/2015   Acquired hypothyroidism 10/04/2014   Diabetic peripheral neuropathy (HCC) 10/04/2014   Essential (primary) hypertension 10/04/2014   DM (diabetes mellitus), type 2 with neurological complications (HCC) 10/04/2014    Allergies  Allergen  Reactions   Shellfish Allergy Hives and Itching    Past Surgical History:  Procedure Laterality Date   ADRENALECTOMY Right 05/28/2022   APPENDECTOMY     CATARACT EXTRACTION W/ INTRAOCULAR LENS  IMPLANT, BILATERAL  2010   CESAREAN SECTION  1983   CHOLECYSTECTOMY  2005   ECTOPIC PREGNANCY SURGERY     LUMBAR LAMINECTOMY/DECOMPRESSION MICRODISCECTOMY Right 11/08/2021   Procedure: RIGHT L5-S1 MICRODISCECTOMY;  Surgeon: Clois Fret, MD;  Location: [facility];  Service: Neurosurgery;  Laterality: Right;   MASTECTOMY, PARTIAL Right 2010   REDUCTION MAMMAPLASTY Left 2011   RIGHT/LEFT HEART CATH AND CORONARY ANGIOGRAPHY Bilateral 08/06/2018   Procedure: RIGHT/LEFT HEART CATH AND CORONARY ANGIOGRAPHY;  Surgeon: Bosie Vinie LABOR, MD;  Location: [facility];  Service: Cardiovascular;  Laterality: Bilateral;   TOTAL KNEE ARTHROPLASTY Right 01/04/2022   Procedure: TOTAL KNEE ARTHROPLASTY;  Surgeon: Kathlynn Sharper, MD;  Location: [facility];  Service: Orthopedics;  Laterality: Right;   TRANSCATHETER AORTIC VALVE REPLACEMENT, TRANSAPICAL  08/18/2018   Procedure: TRANSCATHETER AORTIC VALVE REPLACEMENT; Location: [facility]; Surgeon: Bernardino Lares, MD    Social History   Tobacco Use   Smoking status: Former    Current packs/day: 0.00    Average packs/day: 1 pack/day for 40.0 years (40.0 ttl pk-yrs)    Types: Cigarettes    Start date: 06/18/1972    Quit date: 06/18/2012  Years since quitting: 11.1   Smokeless tobacco: Never  Vaping Use   Vaping status: Never Used  Substance Use Topics   Alcohol  use: No    Alcohol /week: 0.0 standard drinks of alcohol    Drug use: No     Medication list has been reviewed and updated.  No outpatient medications have been marked as taking for the 07/25/23 encounter (Orders Only) with Justus Leita DEL, MD.       07/01/2023   11:17 AM 02/25/2023    9:25 AM 10/22/2022   10:47 AM 06/22/2022   11:05 AM  GAD 7 : Generalized Anxiety Score  Nervous, Anxious, on Edge  0 1 0 1  Control/stop worrying 0 0 0 1  Worry too much - different things 0 1 0 0  Trouble relaxing 0 0 0 0  Restless 0 0 0 0  Easily annoyed or irritable 0 0 0 0  Afraid - awful might happen 0 1 0 0  Total GAD 7 Score 0 3 0 2  Anxiety Difficulty Not difficult at all Not difficult at all Not difficult at all Not difficult at all       07/01/2023   11:16 AM 02/25/2023    9:24 AM 10/22/2022   10:47 AM  Depression screen PHQ 2/9  Decreased Interest 1 0 0  Down, Depressed, Hopeless 1 0 0  PHQ - 2 Score 2 0 0  Altered sleeping 0 0 0  Tired, decreased energy 0 3 0  Change in appetite 0 2 1  Feeling bad or failure about yourself  0 0 0  Trouble concentrating 0 0 0  Moving slowly or fidgety/restless 0 0 0  Suicidal thoughts 0 0 0  PHQ-9 Score 2 5 1   Difficult doing work/chores Not difficult at all Not difficult at all Not difficult at all    BP Readings from Last 3 Encounters:  07/01/23 116/62  02/25/23 126/76  10/22/22 128/74    Physical Exam  Wt Readings from Last 3 Encounters:  07/01/23 251 lb (113.9 kg)  02/25/23 243 lb (110.2 kg)  10/22/22 236 lb 6.4 oz (107.2 kg)    There were no vitals taken for this visit.  Assessment and Plan:  Problem List Items Addressed This Visit   None   No follow-ups on file.    Leita HILARIO Justus, MD [facility] [place]

## 2023-07-28 ENCOUNTER — Other Ambulatory Visit: Payer: Self-pay | Admitting: Internal Medicine

## 2023-07-28 DIAGNOSIS — N1831 Chronic kidney disease, stage 3a: Secondary | ICD-10-CM

## 2023-07-28 MED ORDER — METFORMIN HCL ER 500 MG PO TB24: ORAL_TABLET | ORAL | 3 refills | Status: DC

## 2023-08-09 DIAGNOSIS — M79672 Pain in left foot: Secondary | ICD-10-CM | POA: Diagnosis not present

## 2023-08-09 DIAGNOSIS — E1142 Type 2 diabetes mellitus with diabetic polyneuropathy: Secondary | ICD-10-CM | POA: Diagnosis not present

## 2023-08-09 DIAGNOSIS — L03032 Cellulitis of left toe: Secondary | ICD-10-CM | POA: Diagnosis not present

## 2023-08-14 DIAGNOSIS — E1142 Type 2 diabetes mellitus with diabetic polyneuropathy: Secondary | ICD-10-CM | POA: Diagnosis not present

## 2023-08-14 DIAGNOSIS — L03032 Cellulitis of left toe: Secondary | ICD-10-CM | POA: Diagnosis not present

## 2023-08-21 DIAGNOSIS — L03032 Cellulitis of left toe: Secondary | ICD-10-CM | POA: Diagnosis not present

## 2023-08-21 DIAGNOSIS — E1142 Type 2 diabetes mellitus with diabetic polyneuropathy: Secondary | ICD-10-CM | POA: Diagnosis not present

## 2023-08-22 ENCOUNTER — Other Ambulatory Visit (INDEPENDENT_AMBULATORY_CARE_PROVIDER_SITE_OTHER): Payer: Self-pay | Admitting: Podiatry

## 2023-08-22 DIAGNOSIS — I739 Peripheral vascular disease, unspecified: Secondary | ICD-10-CM

## 2023-08-26 ENCOUNTER — Ambulatory Visit (INDEPENDENT_AMBULATORY_CARE_PROVIDER_SITE_OTHER)

## 2023-08-26 DIAGNOSIS — I739 Peripheral vascular disease, unspecified: Secondary | ICD-10-CM | POA: Diagnosis not present

## 2023-08-28 ENCOUNTER — Other Ambulatory Visit: Payer: Self-pay | Admitting: Nephrology

## 2023-08-28 DIAGNOSIS — D631 Anemia in chronic kidney disease: Secondary | ICD-10-CM

## 2023-08-28 DIAGNOSIS — E1122 Type 2 diabetes mellitus with diabetic chronic kidney disease: Secondary | ICD-10-CM | POA: Diagnosis not present

## 2023-08-28 DIAGNOSIS — N183 Chronic kidney disease, stage 3 unspecified: Secondary | ICD-10-CM

## 2023-08-28 DIAGNOSIS — E785 Hyperlipidemia, unspecified: Secondary | ICD-10-CM | POA: Diagnosis not present

## 2023-08-28 DIAGNOSIS — R809 Proteinuria, unspecified: Secondary | ICD-10-CM | POA: Diagnosis not present

## 2023-08-28 DIAGNOSIS — E896 Postprocedural adrenocortical (-medullary) hypofunction: Secondary | ICD-10-CM | POA: Diagnosis not present

## 2023-08-28 DIAGNOSIS — Z853 Personal history of malignant neoplasm of breast: Secondary | ICD-10-CM | POA: Diagnosis not present

## 2023-08-28 DIAGNOSIS — I509 Heart failure, unspecified: Secondary | ICD-10-CM | POA: Diagnosis not present

## 2023-08-28 DIAGNOSIS — I10 Essential (primary) hypertension: Secondary | ICD-10-CM | POA: Diagnosis not present

## 2023-08-28 DIAGNOSIS — Z952 Presence of prosthetic heart valve: Secondary | ICD-10-CM | POA: Diagnosis not present

## 2023-08-28 DIAGNOSIS — N1832 Chronic kidney disease, stage 3b: Secondary | ICD-10-CM | POA: Diagnosis not present

## 2023-08-28 DIAGNOSIS — Z9011 Acquired absence of right breast and nipple: Secondary | ICD-10-CM | POA: Diagnosis not present

## 2023-09-02 DIAGNOSIS — I739 Peripheral vascular disease, unspecified: Secondary | ICD-10-CM | POA: Diagnosis not present

## 2023-09-02 DIAGNOSIS — L03032 Cellulitis of left toe: Secondary | ICD-10-CM | POA: Diagnosis not present

## 2023-09-02 DIAGNOSIS — E1142 Type 2 diabetes mellitus with diabetic polyneuropathy: Secondary | ICD-10-CM | POA: Diagnosis not present

## 2023-09-06 ENCOUNTER — Other Ambulatory Visit: Payer: Self-pay

## 2023-09-06 ENCOUNTER — Other Ambulatory Visit: Payer: Self-pay | Admitting: Internal Medicine

## 2023-09-06 DIAGNOSIS — I10 Essential (primary) hypertension: Secondary | ICD-10-CM

## 2023-09-06 MED ORDER — LISINOPRIL-HYDROCHLOROTHIAZIDE 10-12.5 MG PO TABS: 1.0000 | ORAL_TABLET | Freq: Every day | ORAL | 0 refills | Status: DC

## 2023-09-09 ENCOUNTER — Ambulatory Visit (INDEPENDENT_AMBULATORY_CARE_PROVIDER_SITE_OTHER): Admitting: Vascular Surgery

## 2023-09-09 ENCOUNTER — Ambulatory Visit
Admission: RE | Admit: 2023-09-09 | Discharge: 2023-09-09 | Disposition: A | Discharge status: Home / Self Care | Source: Ambulatory Visit | Attending: Nephrology | Admitting: Nephrology

## 2023-09-09 ENCOUNTER — Encounter (INDEPENDENT_AMBULATORY_CARE_PROVIDER_SITE_OTHER): Payer: Self-pay | Admitting: Vascular Surgery

## 2023-09-09 VITALS — BP 116/72 | HR 90 | Resp 18 | Wt 243.6 lb

## 2023-09-09 DIAGNOSIS — E782 Mixed hyperlipidemia: Secondary | ICD-10-CM

## 2023-09-09 DIAGNOSIS — E785 Hyperlipidemia, unspecified: Secondary | ICD-10-CM | POA: Diagnosis not present

## 2023-09-09 DIAGNOSIS — N1832 Chronic kidney disease, stage 3b: Secondary | ICD-10-CM

## 2023-09-09 DIAGNOSIS — N183 Chronic kidney disease, stage 3 unspecified: Secondary | ICD-10-CM | POA: Diagnosis not present

## 2023-09-09 DIAGNOSIS — N1831 Chronic kidney disease, stage 3a: Secondary | ICD-10-CM | POA: Diagnosis not present

## 2023-09-09 DIAGNOSIS — L03032 Cellulitis of left toe: Secondary | ICD-10-CM | POA: Diagnosis not present

## 2023-09-09 DIAGNOSIS — E1122 Type 2 diabetes mellitus with diabetic chronic kidney disease: Secondary | ICD-10-CM

## 2023-09-09 DIAGNOSIS — D631 Anemia in chronic kidney disease: Secondary | ICD-10-CM

## 2023-09-09 DIAGNOSIS — R809 Proteinuria, unspecified: Secondary | ICD-10-CM | POA: Diagnosis not present

## 2023-09-09 DIAGNOSIS — I7025 Atherosclerosis of native arteries of other extremities with ulceration: Secondary | ICD-10-CM | POA: Diagnosis not present

## 2023-09-09 DIAGNOSIS — M79672 Pain in left foot: Secondary | ICD-10-CM | POA: Diagnosis not present

## 2023-09-09 DIAGNOSIS — I10 Essential (primary) hypertension: Secondary | ICD-10-CM

## 2023-09-09 DIAGNOSIS — E1142 Type 2 diabetes mellitus with diabetic polyneuropathy: Secondary | ICD-10-CM | POA: Diagnosis not present

## 2023-09-09 DIAGNOSIS — E1149 Type 2 diabetes mellitus with other diabetic neurological complication: Secondary | ICD-10-CM | POA: Diagnosis not present

## 2023-09-09 DIAGNOSIS — I739 Peripheral vascular disease, unspecified: Secondary | ICD-10-CM | POA: Diagnosis not present

## 2023-09-10 ENCOUNTER — Other Ambulatory Visit: Payer: Self-pay

## 2023-09-10 ENCOUNTER — Encounter: Payer: Self-pay | Admitting: Internal Medicine

## 2023-09-10 ENCOUNTER — Telehealth: Payer: Self-pay

## 2023-09-10 DIAGNOSIS — E1142 Type 2 diabetes mellitus with diabetic polyneuropathy: Secondary | ICD-10-CM

## 2023-09-10 DIAGNOSIS — E118 Type 2 diabetes mellitus with unspecified complications: Secondary | ICD-10-CM

## 2023-09-10 DIAGNOSIS — I10 Essential (primary) hypertension: Secondary | ICD-10-CM

## 2023-09-10 DIAGNOSIS — N1831 Chronic kidney disease, stage 3a: Secondary | ICD-10-CM

## 2023-09-10 DIAGNOSIS — E1169 Type 2 diabetes mellitus with other specified complication: Secondary | ICD-10-CM

## 2023-09-10 MED ORDER — ONETOUCH DELICA PLUS LANCET33G MISC: 1.0000 | Freq: Four times a day (QID) | 3 refills | Status: DC | PRN

## 2023-09-10 MED ORDER — LISINOPRIL-HYDROCHLOROTHIAZIDE 10-12.5 MG PO TABS: 1.0000 | ORAL_TABLET | Freq: Every day | ORAL | 0 refills | Status: DC

## 2023-09-10 MED ORDER — TRAMADOL HCL 50 MG PO TABS
100.0000 mg | ORAL_TABLET | Freq: Four times a day (QID) | ORAL | 1 refills | Status: DC | PRN
Start: 2023-09-10 — End: 2023-10-29

## 2023-09-10 MED ORDER — PREGABALIN 50 MG PO CAPS: ORAL_CAPSULE | ORAL | 1 refills | Status: DC

## 2023-09-10 MED ORDER — ATORVASTATIN CALCIUM 20 MG PO TABS: 20.0000 mg | ORAL_TABLET | Freq: Every day | ORAL | 1 refills | Status: DC

## 2023-09-10 MED ORDER — METFORMIN HCL ER 500 MG PO TB24: ORAL_TABLET | ORAL | 3 refills | Status: DC

## 2023-09-10 MED ORDER — ONETOUCH ULTRA BLUE VI STRP
ORAL_STRIP | 12 refills | Status: DC
Start: 2023-09-10 — End: 2023-12-26

## 2023-09-10 NOTE — Telephone Encounter (Signed)
 Patient recently switched pharmacy. Would like her medication sent to her new pharmacy. Updated in chart.   Also requesting hydrocortisone topical cream 1 mg per 100g, medication not in current med list.

## 2023-09-10 NOTE — Telephone Encounter (Signed)
 Copied from CRM [id number]. Topic: General - Call Back - No Documentation >> Sep 10, 2023  1:56 PM Ja-Kwan M wrote: Reason for CRM: Patient stated that she was returning a call. Patient stated she thinks that it was Chassidy but she will just put the information in to [facility]       Please see other telephone messages. Resolved.

## 2023-09-11 ENCOUNTER — Encounter (INDEPENDENT_AMBULATORY_CARE_PROVIDER_SITE_OTHER): Payer: Self-pay | Admitting: Vascular Surgery

## 2023-09-11 ENCOUNTER — Telehealth (INDEPENDENT_AMBULATORY_CARE_PROVIDER_SITE_OTHER): Payer: Self-pay

## 2023-09-11 DIAGNOSIS — I7025 Atherosclerosis of native arteries of other extremities with ulceration: Secondary | ICD-10-CM | POA: Insufficient documentation

## 2023-09-11 NOTE — Telephone Encounter (Signed)
 Spoke with the patient and she is scheduled with Dr. Gilda Crease for a LLE angio with a 6:45 am arrival time to the [facility]. Pre-procedure instructions were discussed and will be sent to Mychart and mailed. Patient was offered 09/17/23 and declined.

## 2023-09-11 NOTE — Progress Notes (Signed)
 MRN : [id number]  Kendra Walton is a 73 y.o. (Feb 06, 1951) female who presents with chief complaint of check circulation.  History of Present Illness:   The patient is seen for evaluation of painful lower extremities and diminished pulses associated with ulceration of the foot.  She notes an ulceration developed on her left great toe quite sometime ago but it seems to be getting better and she is insistent that it is healing.  It is very painful and has had some drainage.  No specific history of trauma noted by the patient.  The patient denies fever or chills.  the patient does have diabetes which has been difficult to control.  Patient notes prior to the ulcer developing the extremities were painful particularly with walking.  The patient denies rest pain or dangling of an extremity off the side of the bed during the night for relief. No prior interventions or surgeries.  No history of back problems or DJD of the lumbar sacral spine.   The patient denies amaurosis fugax or recent TIA symptoms. There are no recent neurological changes noted. The patient denies history of DVT, PE or superficial thrombophlebitis.  The patient denies recent episodes of angina or shortness of breath.   ABI's done 08/26/2023 Rt=1.15 (triphasic) and Lt=0.82 (biphasic)  Current Meds  Medication Sig   aspirin EC 81 MG tablet Take 1 tablet by mouth daily.   clobetasol cream (TEMOVATE) 0.05 % Apply topically 2 (two) times daily.   docusate sodium (COLACE) 100 MG capsule Take 1 capsule (100 mg total) by mouth 2 (two) times daily.   ketoconazole (NIZORAL) 2 % cream Apply topically 2 (two) times daily.   levothyroxine (SYNTHROID) 150 MCG tablet Take 75-150 mcg by mouth See admin instructions. Take 150 mcg daily except Sundays take 75 mcg   Polyvinyl Alcohol-Povidone (REFRESH OP) Place 1 drop into both eyes daily.   senna (SENOKOT) 8.6  MG TABS tablet Take 1 tablet (8.6 mg total) by mouth daily as needed for mild constipation.   [DISCONTINUED] atorvastatin (LIPITOR) 20 MG tablet TAKE 1 TABLET(20 MG) BY MOUTH AT BEDTIME   [DISCONTINUED] glucose blood (ONE TOUCH ULTRA TEST) test strip Use to test BS twice a day   [DISCONTINUED] Lancets (ONETOUCH DELICA PLUS LANCET33G) MISC Place 1 each onto the skin 4 (four) times daily as needed.   [DISCONTINUED] lisinopril-hydrochlorothiazide (ZESTORETIC) 10-12.5 MG tablet Take 1 tablet by mouth daily.   [DISCONTINUED] metFORMIN (GLUCOPHAGE-XR) 500 MG 24 hr tablet TAKE 2 TABLETS(1000 MG) BY MOUTH TWICE DAILY   [DISCONTINUED] pregabalin (LYRICA) 50 MG capsule 2 in am; 1 at 1 pm; 2 at HS   [DISCONTINUED] traMADol (ULTRAM) 50 MG tablet Take 2 tablets (100 mg total) by mouth every 6 (six) hours as needed.    Past Medical History:  Diagnosis Date   Absolute anemia 09/15/2015   Patient declines colonoscopy    Aortic stenosis, moderate 04/05/2017   a.) s/p TAVR 08/18/2018; LEFT axillary approach   Arthritis    Breast cancer, right (HCC) 2010   a.) s/p RIGHT mastectomy; no chemotherpay or XRT   CAD (  coronary artery disease) 08/06/2018   a.) R/LHC 08/06/2018: EF 35%; 20% stenosis oRI; severe AS (MPG 48.2 mmHg; AVA (VTI) = 0.58 cm); mean PA = 36 mmHg, mean PCWP = 27 mmHg, PVR 1.78 WU, CO 5.06 L/min, CI 2.25 L/min/m   CHF (congestive heart failure) (HCC)    a.)  TTE 07/29/2018: EF 35%; moderate global HK; moderate LVH; mild LA dilation; trivial TR/PR, mild AR/MR; moderate AS (MPG 48.2 mmHg); G2DD. b.) R/LHC 08/06/2018; EF 35%; mean PCWP 27 mmHg. c.)  TTE 10/20/2019: EF >55%; mild LVH; trivial MR/TR, mild PR, prostatic AoV. d.)  TTE 08/17/2021: EF >55%; mild LVH; mild LA dilation; trivial MR/TR/PR; prostatic AoV (MPG 12 mmHg); G1DD.   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 b.) CT abd 02/06/2019: measured 4.7 x 3.7 cm. c.) MRI lumbar spine 10/25/2021: interval increase to 7.1 x 7.1 cm.   T2DM (type 2 diabetes mellitus) [facility])     Past Surgical History:  Procedure Laterality Date   ADRENALECTOMY Right 05/28/2022   APPENDECTOMY     CATARACT EXTRACTION W/ INTRAOCULAR LENS  IMPLANT, BILATERAL  2010   CESAREAN SECTION  1983   CHOLECYSTECTOMY  2005   ECTOPIC PREGNANCY SURGERY     LUMBAR LAMINECTOMY/DECOMPRESSION MICRODISCECTOMY Right 11/08/2021   Procedure: RIGHT L5-S1 MICRODISCECTOMY;  Surgeon: Venetia Night, MD;  Location: [facility] ORS;  Service: Neurosurgery;  Laterality: Right;   MASTECTOMY, PARTIAL Right 2010   REDUCTION MAMMAPLASTY Left 2011   RIGHT/LEFT HEART CATH AND CORONARY ANGIOGRAPHY Bilateral 08/06/2018   Procedure: RIGHT/LEFT HEART CATH AND CORONARY ANGIOGRAPHY;  Surgeon: Dalia Heading, MD;  Location: [facility];  Service: Cardiovascular;  Laterality: Bilateral;   TOTAL KNEE ARTHROPLASTY Right 01/04/2022   Procedure: TOTAL KNEE ARTHROPLASTY;  Surgeon: Kennedy Bucker, MD;  Location: [facility];  Service: Orthopedics;  Laterality: Right;   TRANSCATHETER AORTIC VALVE REPLACEMENT, TRANSAPICAL  08/18/2018   Procedure: TRANSCATHETER AORTIC VALVE REPLACEMENT; Location: [facility]; Surgeon: Flint Melter, MD    Social History Social History   Tobacco Use   Smoking status: Former    Current packs/day: 0.00    Average packs/day: 1 pack/day for 40.0 years (40.0 ttl pk-yrs)    Types: Cigarettes    Start date: 06/18/1972    Quit date: 06/18/2012    Years since quitting: 11.2   Smokeless  tobacco: Never  Vaping Use   Vaping status: Never Used  Substance Use Topics   Alcohol use: No    Alcohol/week: 0.0 standard drinks of alcohol   Drug use: No    Family History Family History  Problem Relation Age of Onset   Hypertension Brother    Diabetes Brother    Breast cancer Neg Hx     Allergies  Allergen Reactions   Shellfish Allergy Hives and Itching     REVIEW OF SYSTEMS (Negative unless checked)  Constitutional: [] Weight loss  [] Fever  [] Chills Cardiac: [] Chest pain   [] Chest pressure   [] Palpitations   [] Shortness of  breath when laying flat   [] Shortness of breath with exertion. Vascular:  [x] Pain in legs with walking   [] Pain in legs at rest  [] History of DVT   [] Phlebitis   [] Swelling in legs   [] Varicose veins   [] Non-healing ulcers Pulmonary:   [] Uses home oxygen   [] Productive cough   [] Hemoptysis   [] Wheeze  [] COPD   [] Asthma Neurologic:  [] Dizziness   [] Seizures   [] History of stroke   [] History of TIA  [] Aphasia   [] Vissual changes   [] Weakness or numbness in arm   [] Weakness or numbness in leg Musculoskeletal:   [] Joint swelling   [] Joint pain   [] Low back pain Hematologic:  [] Easy bruising  [] Easy bleeding   [] Hypercoagulable state   [] Anemic Gastrointestinal:  [] Diarrhea   [] Vomiting  [] Gastroesophageal reflux/heartburn   [] Difficulty swallowing. Genitourinary:  [] Chronic kidney disease   [] Difficult urination  [] Frequent urination   [] Blood in urine Skin:  [] Rashes   [] Ulcers  Psychological:  [] History of anxiety   []  History of major depression.  Physical Examination  Vitals:   09/09/23 1142  BP: 116/72  Pulse: 90  Resp: 18  Weight: 243 lb 9.6 oz (110.5 kg)   Body mass index is 37.04 kg/m. Gen: WD/WN, NAD Head: [place]/AT, No temporalis wasting.  Ear/Nose/Throat: Hearing grossly intact, nares w/o erythema or drainage Eyes: PER, EOMI, sclera nonicteric.  Neck: Supple, no masses.  No bruit or JVD.  Pulmonary:  Good air movement, no audible  wheezing, no use of accessory muscles.  Cardiac: RRR, normal S1, S2, no Murmurs. Vascular:  mild trophic changes, open wound left great toe Vessel Right Left  Radial Palpable Palpable  PT Not Palpable Not Palpable  DP Trace Palpable Not Palpable  Gastrointestinal: soft, non-distended. No guarding/no peritoneal signs.  Musculoskeletal: M/S 5/5 throughout.  No visible deformity.  Neurologic: CN 2-12 intact. Pain and light touch intact in extremities.  Symmetrical.  Speech is fluent. Motor exam as listed above. Psychiatric: Judgment intact, Mood & affect appropriate for pt's clinical situation. Dermatologic: No rashes or ulcers noted.  No changes consistent with cellulitis.   CBC Lab Results  Component Value Date   WBC 10.2 07/01/2023   HGB 9.9 (L) 07/01/2023   HCT 31.8 (L) 07/01/2023   MCV 86 07/01/2023   PLT 269 07/01/2023    BMET    Component Value Date/Time   NA 139 07/01/2023 1143   K 5.9 (H) 07/01/2023 1143   CL 104 07/01/2023 1143   CO2 21 07/01/2023 1143   GLUCOSE 119 (H) 07/01/2023 1143   GLUCOSE 151 (H) 01/05/2022 0829   BUN 35 (H) 07/01/2023 1143   CREATININE 1.50 (H) 07/01/2023 1143   CALCIUM 9.4 07/01/2023 1143   GFRNONAA 56 (L) 01/05/2022 0829   GFRAA 65 05/09/2020 0925   CrCl cannot be calculated (Patient's most recent lab result is older than the maximum 21 days allowed.).  COAG No results found for: "INR", "PROTIME"  Radiology VAS [place] ABI WITH/WO TBI Result Date: 08/26/2023  LOWER EXTREMITY DOPPLER STUDY Patient Name:  Kendra Walton  Date of Exam:   08/26/2023 Medical Rec #: [id number]                 Accession #:    [id number] Date of Birth: 07/12/1950                 Patient Gender: F Patient Age:   34 years Exam Location:  [facility] Procedure:  VAS [place] ABI WITH/WO TBI Referring Phys: --------------------------------------------------------------------------------  Indications: Lt great toe wound x 2 weeks, slow healing   Performing Technologist: Salvadore Farber RVT  Examination Guidelines: A complete evaluation includes at minimum, Doppler waveform signals and systolic blood pressure reading at the level of bilateral brachial, anterior tibial, and posterior tibial arteries, when vessel segments are accessible. Bilateral testing is considered an integral part of a complete examination. Photoelectric Plethysmograph (PPG) waveforms and toe systolic pressure readings are included as required and additional duplex testing as needed. Limited examinations for reoccurring indications may be performed as noted.  ABI Findings: +---------+------------------+-----+---------+--------+ Right    Rt Pressure (mmHg)IndexWaveform Comment  +---------+------------------+-----+---------+--------+ Brachial 143                                      +---------+------------------+-----+---------+--------+ PTA      167               1.15 biphasic          +---------+------------------+-----+---------+--------+ DP       149               1.03 triphasic         +---------+------------------+-----+---------+--------+ Great Toe97                0.67 Normal            +---------+------------------+-----+---------+--------+ +---------+------------------+-----+--------+-------+ Left     Lt Pressure (mmHg)IndexWaveformComment +---------+------------------+-----+--------+-------+ Brachial 145                                    +---------+------------------+-----+--------+-------+ PTA      124               0.86 biphasic        +---------+------------------+-----+--------+-------+ DP       104               0.72 biphasic        +---------+------------------+-----+--------+-------+ Great Toe77                0.53 Abnormal        +---------+------------------+-----+--------+-------+ Patient was scheduled for consult due to findings.  Summary: Right: Resting right ankle-brachial index is within normal range. The  right toe-brachial index is normal. Left: Resting left ankle-brachial index indicates mild left lower extremity arterial disease. The left toe-brachial index is abnormal. *See table(s) above for measurements and observations.  Electronically signed by [name] MD on 08/26/2023 at 4:52:32 PM.    Final      Assessment/Plan 1. Atherosclerosis of native arteries of the extremities with ulceration (HCC) (Primary)  Recommend:  The patient has evidence of severe atherosclerotic changes of both lower extremities associated with ulceration and tissue loss of the left foot.  This represents a limb threatening ischemia and places the patient at the risk for left limb loss.  Patient should undergo angiography of the left lower extremity with the hope for intervention for limb salvage.  The risks and benefits as well as the alternative therapies was discussed in detail with the patient.  All questions were answered.  Patient feels that her wound is getting better and is not yet ready to proceed with left angiography.  She notes that she has an appointment with Dr. Ether Griffins later today and will discuss this with him.  The patient will  follow up with me in the office after the procedure.   NB: I have communicated with Dr. Ether Griffins and he is discussed this with the patient.  He is strongly recommended the patient undergo angiography with the hope for intervention for limb salvage.  At this point she is agreeable and we will move forward.  W10.272    critical limb ischemia of the lower extremity I70.25      Atherosclerotic occlusive disease with ulceration  CPT codes: 53664   stent placement femoral-popliteal  36247   introduction catheter below diaphragm third order  2. Essential (primary) hypertension Continue antihypertensive medications as already ordered, these medications have been reviewed and there are no changes at this time.  3. DM (diabetes mellitus), type 2 with neurological complications  (HCC) Continue hypoglycemic medications as already ordered, these medications have been reviewed and there are no changes at this time.  Hgb A1C to be monitored as already arranged by primary service  4. Chronic kidney disease, stage 3a (HCC) The patient has advanced renal disease.  However, at the present time the patient is not yet on dialysis.  Given this fact we will plan for hydration for several hours prior to angiography.  Avoid nephrotoxic medications and dehydration.  Further plans per nephrology  5. Mixed hyperlipidemia Continue statin as ordered and reviewed, no changes at this time    [name], MD  09/11/2023 7:34 AM

## 2023-09-11 NOTE — H&P (View-Only) (Signed)
 MRN : [id number]  Kendra Walton is a 73 y.o. (Feb 06, 1951) female who presents with chief complaint of check circulation.  History of Present Illness:   The patient is seen for evaluation of painful lower extremities and diminished pulses associated with ulceration of the foot.  She notes an ulceration developed on her left great toe quite sometime ago but it seems to be getting better and she is insistent that it is healing.  It is very painful and has had some drainage.  No specific history of trauma noted by the patient.  The patient denies fever or chills.  the patient does have diabetes which has been difficult to control.  Patient notes prior to the ulcer developing the extremities were painful particularly with walking.  The patient denies rest pain or dangling of an extremity off the side of the bed during the night for relief. No prior interventions or surgeries.  No history of back problems or DJD of the lumbar sacral spine.   The patient denies amaurosis fugax or recent TIA symptoms. There are no recent neurological changes noted. The patient denies history of DVT, PE or superficial thrombophlebitis.  The patient denies recent episodes of angina or shortness of breath.   ABI's done 08/26/2023 Rt=1.15 (triphasic) and Lt=0.82 (biphasic)  Current Meds  Medication Sig   aspirin EC 81 MG tablet Take 1 tablet by mouth daily.   clobetasol cream (TEMOVATE) 0.05 % Apply topically 2 (two) times daily.   docusate sodium (COLACE) 100 MG capsule Take 1 capsule (100 mg total) by mouth 2 (two) times daily.   ketoconazole (NIZORAL) 2 % cream Apply topically 2 (two) times daily.   levothyroxine (SYNTHROID) 150 MCG tablet Take 75-150 mcg by mouth See admin instructions. Take 150 mcg daily except Sundays take 75 mcg   Polyvinyl Alcohol-Povidone (REFRESH OP) Place 1 drop into both eyes daily.   senna (SENOKOT) 8.6  MG TABS tablet Take 1 tablet (8.6 mg total) by mouth daily as needed for mild constipation.   [DISCONTINUED] atorvastatin (LIPITOR) 20 MG tablet TAKE 1 TABLET(20 MG) BY MOUTH AT BEDTIME   [DISCONTINUED] glucose blood (ONE TOUCH ULTRA TEST) test strip Use to test BS twice a day   [DISCONTINUED] Lancets (ONETOUCH DELICA PLUS LANCET33G) MISC Place 1 each onto the skin 4 (four) times daily as needed.   [DISCONTINUED] lisinopril-hydrochlorothiazide (ZESTORETIC) 10-12.5 MG tablet Take 1 tablet by mouth daily.   [DISCONTINUED] metFORMIN (GLUCOPHAGE-XR) 500 MG 24 hr tablet TAKE 2 TABLETS(1000 MG) BY MOUTH TWICE DAILY   [DISCONTINUED] pregabalin (LYRICA) 50 MG capsule 2 in am; 1 at 1 pm; 2 at HS   [DISCONTINUED] traMADol (ULTRAM) 50 MG tablet Take 2 tablets (100 mg total) by mouth every 6 (six) hours as needed.    Past Medical History:  Diagnosis Date   Absolute anemia 09/15/2015   Patient declines colonoscopy    Aortic stenosis, moderate 04/05/2017   a.) s/p TAVR 08/18/2018; LEFT axillary approach   Arthritis    Breast cancer, right (HCC) 2010   a.) s/p RIGHT mastectomy; no chemotherpay or XRT   CAD (  coronary artery disease) 08/06/2018   a.) R/LHC 08/06/2018: EF 35%; 20% stenosis oRI; severe AS (MPG 48.2 mmHg; AVA (VTI) = 0.58 cm); mean PA = 36 mmHg, mean PCWP = 27 mmHg, PVR 1.78 WU, CO 5.06 L/min, CI 2.25 L/min/m   CHF (congestive heart failure) (HCC)    a.)  TTE 07/29/2018: EF 35%; moderate global HK; moderate LVH; mild LA dilation; trivial TR/PR, mild AR/MR; moderate AS (MPG 48.2 mmHg); G2DD. b.) R/LHC 08/06/2018; EF 35%; mean PCWP 27 mmHg. c.)  TTE 10/20/2019: EF >55%; mild LVH; trivial MR/TR, mild PR, prostatic AoV. d.)  TTE 08/17/2021: EF >55%; mild LVH; mild LA dilation; trivial MR/TR/PR; prostatic AoV (MPG 12 mmHg); G1DD.   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 b.) CT abd 02/06/2019: measured 4.7 x 3.7 cm. c.) MRI lumbar spine 10/25/2021: interval increase to 7.1 x 7.1 cm.   T2DM (type 2 diabetes mellitus) [facility])     Past Surgical History:  Procedure Laterality Date   ADRENALECTOMY Right 05/28/2022   APPENDECTOMY     CATARACT EXTRACTION W/ INTRAOCULAR LENS  IMPLANT, BILATERAL  2010   CESAREAN SECTION  1983   CHOLECYSTECTOMY  2005   ECTOPIC PREGNANCY SURGERY     LUMBAR LAMINECTOMY/DECOMPRESSION MICRODISCECTOMY Right 11/08/2021   Procedure: RIGHT L5-S1 MICRODISCECTOMY;  Surgeon: Venetia Night, MD;  Location: [facility] ORS;  Service: Neurosurgery;  Laterality: Right;   MASTECTOMY, PARTIAL Right 2010   REDUCTION MAMMAPLASTY Left 2011   RIGHT/LEFT HEART CATH AND CORONARY ANGIOGRAPHY Bilateral 08/06/2018   Procedure: RIGHT/LEFT HEART CATH AND CORONARY ANGIOGRAPHY;  Surgeon: Dalia Heading, MD;  Location: [facility];  Service: Cardiovascular;  Laterality: Bilateral;   TOTAL KNEE ARTHROPLASTY Right 01/04/2022   Procedure: TOTAL KNEE ARTHROPLASTY;  Surgeon: Kennedy Bucker, MD;  Location: [facility] OR[facility];  Service: Orthopedics;  Laterality: Right;   TRANSCATHETER AORTIC VALVE REPLACEMENT, TRANSAPICAL  08/18/2018   Procedure: TRANSCATHETER AORTIC VALVE REPLACEMENT; Location: [facility]; Surgeon: Flint Melter, MD    Social History Social History   Tobacco Use   Smoking status: Former    Current packs/day: 0.00    Average packs/day: 1 pack/day for 40.0 years (40.0 ttl pk-yrs)    Types: Cigarettes    Start date: 06/18/1972    Quit date: 06/18/2012    Years since quitting: 11.2   Smokeless  tobacco: Never  Vaping Use   Vaping status: Never Used  Substance Use Topics   Alcohol use: No    Alcohol/week: 0.0 standard drinks of alcohol   Drug use: No    Family History Family History  Problem Relation Age of Onset   Hypertension Brother    Diabetes Brother    Breast cancer Neg Hx     Allergies  Allergen Reactions   Shellfish Allergy Hives and Itching     REVIEW OF SYSTEMS (Negative unless checked)  Constitutional: [] Weight loss  [] Fever  [] Chills Cardiac: [] Chest pain   [] Chest pressure   [] Palpitations   [] Shortness of  breath when laying flat   [] Shortness of breath with exertion. Vascular:  [x] Pain in legs with walking   [] Pain in legs at rest  [] History of DVT   [] Phlebitis   [] Swelling in legs   [] Varicose veins   [] Non-healing ulcers Pulmonary:   [] Uses home oxygen   [] Productive cough   [] Hemoptysis   [] Wheeze  [] COPD   [] Asthma Neurologic:  [] Dizziness   [] Seizures   [] History of stroke   [] History of TIA  [] Aphasia   [] Vissual changes   [] Weakness or numbness in arm   [] Weakness or numbness in leg Musculoskeletal:   [] Joint swelling   [] Joint pain   [] Low back pain Hematologic:  [] Easy bruising  [] Easy bleeding   [] Hypercoagulable state   [] Anemic Gastrointestinal:  [] Diarrhea   [] Vomiting  [] Gastroesophageal reflux/heartburn   [] Difficulty swallowing. Genitourinary:  [] Chronic kidney disease   [] Difficult urination  [] Frequent urination   [] Blood in urine Skin:  [] Rashes   [] Ulcers  Psychological:  [] History of anxiety   []  History of major depression.  Physical Examination  Vitals:   09/09/23 1142  BP: 116/72  Pulse: 90  Resp: 18  Weight: 243 lb 9.6 oz (110.5 kg)   Body mass index is 37.04 kg/m. Gen: WD/WN, NAD Head: [place]/AT, No temporalis wasting.  Ear/Nose/Throat: Hearing grossly intact, nares w/o erythema or drainage Eyes: PER, EOMI, sclera nonicteric.  Neck: Supple, no masses.  No bruit or JVD.  Pulmonary:  Good air movement, no audible  wheezing, no use of accessory muscles.  Cardiac: RRR, normal S1, S2, no Murmurs. Vascular:  mild trophic changes, open wound left great toe Vessel Right Left  Radial Palpable Palpable  PT Not Palpable Not Palpable  DP Trace Palpable Not Palpable  Gastrointestinal: soft, non-distended. No guarding/no peritoneal signs.  Musculoskeletal: M/S 5/5 throughout.  No visible deformity.  Neurologic: CN 2-12 intact. Pain and light touch intact in extremities.  Symmetrical.  Speech is fluent. Motor exam as listed above. Psychiatric: Judgment intact, Mood & affect appropriate for pt's clinical situation. Dermatologic: No rashes or ulcers noted.  No changes consistent with cellulitis.   CBC Lab Results  Component Value Date   WBC 10.2 07/01/2023   HGB 9.9 (L) 07/01/2023   HCT 31.8 (L) 07/01/2023   MCV 86 07/01/2023   PLT 269 07/01/2023    BMET    Component Value Date/Time   NA 139 07/01/2023 1143   K 5.9 (H) 07/01/2023 1143   CL 104 07/01/2023 1143   CO2 21 07/01/2023 1143   GLUCOSE 119 (H) 07/01/2023 1143   GLUCOSE 151 (H) 01/05/2022 0829   BUN 35 (H) 07/01/2023 1143   CREATININE 1.50 (H) 07/01/2023 1143   CALCIUM 9.4 07/01/2023 1143   GFRNONAA 56 (L) 01/05/2022 0829   GFRAA 65 05/09/2020 0925   CrCl cannot be calculated (Patient's most recent lab result is older than the maximum 21 days allowed.).  COAG No results found for: "INR", "PROTIME"  Radiology VAS [place] ABI WITH/WO TBI Result Date: 08/26/2023  LOWER EXTREMITY DOPPLER STUDY Patient Name:  Teriah Muela Lemelle  Date of Exam:   08/26/2023 Medical Rec #: [id number]                 Accession #:    [id number] Date of Birth: 07/12/1950                 Patient Gender: F Patient Age:   34 years Exam Location:  [facility] Procedure:  VAS [place] ABI WITH/WO TBI Referring Phys: --------------------------------------------------------------------------------  Indications: Lt great toe wound x 2 weeks, slow healing   Performing Technologist: Salvadore Farber RVT  Examination Guidelines: A complete evaluation includes at minimum, Doppler waveform signals and systolic blood pressure reading at the level of bilateral brachial, anterior tibial, and posterior tibial arteries, when vessel segments are accessible. Bilateral testing is considered an integral part of a complete examination. Photoelectric Plethysmograph (PPG) waveforms and toe systolic pressure readings are included as required and additional duplex testing as needed. Limited examinations for reoccurring indications may be performed as noted.  ABI Findings: +---------+------------------+-----+---------+--------+ Right    Rt Pressure (mmHg)IndexWaveform Comment  +---------+------------------+-----+---------+--------+ Brachial 143                                      +---------+------------------+-----+---------+--------+ PTA      167               1.15 biphasic          +---------+------------------+-----+---------+--------+ DP       149               1.03 triphasic         +---------+------------------+-----+---------+--------+ Great Toe97                0.67 Normal            +---------+------------------+-----+---------+--------+ +---------+------------------+-----+--------+-------+ Left     Lt Pressure (mmHg)IndexWaveformComment +---------+------------------+-----+--------+-------+ Brachial 145                                    +---------+------------------+-----+--------+-------+ PTA      124               0.86 biphasic        +---------+------------------+-----+--------+-------+ DP       104               0.72 biphasic        +---------+------------------+-----+--------+-------+ Great Toe77                0.53 Abnormal        +---------+------------------+-----+--------+-------+ Patient was scheduled for consult due to findings.  Summary: Right: Resting right ankle-brachial index is within normal range. The  right toe-brachial index is normal. Left: Resting left ankle-brachial index indicates mild left lower extremity arterial disease. The left toe-brachial index is abnormal. *See table(s) above for measurements and observations.  Electronically signed by [name] MD on 08/26/2023 at 4:52:32 PM.    Final      Assessment/Plan 1. Atherosclerosis of native arteries of the extremities with ulceration (HCC) (Primary)  Recommend:  The patient has evidence of severe atherosclerotic changes of both lower extremities associated with ulceration and tissue loss of the left foot.  This represents a limb threatening ischemia and places the patient at the risk for left limb loss.  Patient should undergo angiography of the left lower extremity with the hope for intervention for limb salvage.  The risks and benefits as well as the alternative therapies was discussed in detail with the patient.  All questions were answered.  Patient feels that her wound is getting better and is not yet ready to proceed with left angiography.  She notes that she has an appointment with Dr. Ether Griffins later today and will discuss this with him.  The patient will  follow up with me in the office after the procedure.   NB: I have communicated with Dr. Ether Griffins and he is discussed this with the patient.  He is strongly recommended the patient undergo angiography with the hope for intervention for limb salvage.  At this point she is agreeable and we will move forward.  W10.272    critical limb ischemia of the lower extremity I70.25      Atherosclerotic occlusive disease with ulceration  CPT codes: 53664   stent placement femoral-popliteal  36247   introduction catheter below diaphragm third order  2. Essential (primary) hypertension Continue antihypertensive medications as already ordered, these medications have been reviewed and there are no changes at this time.  3. DM (diabetes mellitus), type 2 with neurological complications  (HCC) Continue hypoglycemic medications as already ordered, these medications have been reviewed and there are no changes at this time.  Hgb A1C to be monitored as already arranged by primary service  4. Chronic kidney disease, stage 3a (HCC) The patient has advanced renal disease.  However, at the present time the patient is not yet on dialysis.  Given this fact we will plan for hydration for several hours prior to angiography.  Avoid nephrotoxic medications and dehydration.  Further plans per nephrology  5. Mixed hyperlipidemia Continue statin as ordered and reviewed, no changes at this time    [name], MD  09/11/2023 7:34 AM

## 2023-09-16 ENCOUNTER — Other Ambulatory Visit: Payer: Self-pay | Admitting: Internal Medicine

## 2023-09-16 DIAGNOSIS — E1142 Type 2 diabetes mellitus with diabetic polyneuropathy: Secondary | ICD-10-CM | POA: Diagnosis not present

## 2023-09-16 DIAGNOSIS — L03032 Cellulitis of left toe: Secondary | ICD-10-CM | POA: Diagnosis not present

## 2023-09-16 DIAGNOSIS — I739 Peripheral vascular disease, unspecified: Secondary | ICD-10-CM | POA: Diagnosis not present

## 2023-09-16 DIAGNOSIS — L97522 Non-pressure chronic ulcer of other part of left foot with fat layer exposed: Secondary | ICD-10-CM | POA: Diagnosis not present

## 2023-09-16 NOTE — Telephone Encounter (Unsigned)
 Copied from CRM [id number]. Topic: Clinical - Medication Refill >> Sep 16, 2023 11:59 AM Fuller Mandril wrote: Most Recent Primary Care Visit:  Provider: Reubin Milan  Department: [facility]  Visit Type: OFFICE VISIT  Date: 07/01/2023  Medication: hydrocortisone cream 1mg  /100g  Has the patient contacted their pharmacy? N/A - pharmacy requested (Agent: If no, request that the patient contact the pharmacy for the refill. If patient does not wish to contact the pharmacy document the reason why and proceed with request.) (Agent: If yes, when and what did the pharmacy advise?)  Is this the correct pharmacy for this prescription? Yes If no, delete pharmacy and type the correct one.  This is the patient's preferred pharmacy:  [facility] - [place] [address] [place] [address] Phone: [phone] Fax: [phone]   Has the prescription been filled recently? N/A  Is the patient out of the medication? N/A  Has the patient been seen for an appointment in the last year OR does the patient have an upcoming appointment? Yes  Can we respond through MyChart? No  Agent: Please be advised that Rx refills may take up to 3 business days. We ask that you follow-up with your pharmacy.

## 2023-09-18 DIAGNOSIS — Z961 Presence of intraocular lens: Secondary | ICD-10-CM | POA: Diagnosis not present

## 2023-09-18 DIAGNOSIS — H0289 Other specified disorders of eyelid: Secondary | ICD-10-CM | POA: Diagnosis not present

## 2023-09-18 DIAGNOSIS — E119 Type 2 diabetes mellitus without complications: Secondary | ICD-10-CM | POA: Diagnosis not present

## 2023-09-18 LAB — HM DIABETES EYE EXAM

## 2023-09-18 NOTE — Telephone Encounter (Signed)
 Requested medication (s) are due for refill today: -  Requested medication (s) are on the active medication list: historical med  Last refill:  10/22/22 hx med  Future visit scheduled: yes  Notes to clinic:  med not delegated to NT to RF and hx med and provider   Requested Prescriptions  Pending Prescriptions Disp Refills   clobetasol cream (TEMOVATE) 0.05 % 30 g     Sig: Apply topically 2 (two) times daily.     Not Delegated - Dermatology:  Corticosteroids Failed - 09/18/2023 11:32 AM      Failed - This refill cannot be delegated      Failed - Valid encounter within last 12 months    Recent Outpatient Visits   None     Future Appointments             In 1 month Judithann Graves, Nyoka Cowden, MD [facility] at [facility], [facility]

## 2023-09-18 NOTE — Telephone Encounter (Signed)
 Please review.  KP

## 2023-09-24 ENCOUNTER — Ambulatory Visit
Admission: RE | Admit: 2023-09-24 | Discharge: 2023-09-24 | Disposition: A | Discharge status: Home / Self Care | Attending: Vascular Surgery | Admitting: Vascular Surgery

## 2023-09-24 ENCOUNTER — Encounter
Admission: RE | Disposition: A | Discharge status: Home / Self Care | Payer: Self-pay | Source: Home / Self Care | Attending: Vascular Surgery

## 2023-09-24 ENCOUNTER — Other Ambulatory Visit: Payer: Self-pay

## 2023-09-24 ENCOUNTER — Encounter: Payer: Self-pay | Admitting: Vascular Surgery

## 2023-09-24 DIAGNOSIS — Z87891 Personal history of nicotine dependence: Secondary | ICD-10-CM | POA: Insufficient documentation

## 2023-09-24 DIAGNOSIS — E039 Hypothyroidism, unspecified: Secondary | ICD-10-CM | POA: Diagnosis not present

## 2023-09-24 DIAGNOSIS — I743 Embolism and thrombosis of arteries of the lower extremities: Secondary | ICD-10-CM | POA: Diagnosis not present

## 2023-09-24 DIAGNOSIS — Z833 Family history of diabetes mellitus: Secondary | ICD-10-CM | POA: Diagnosis not present

## 2023-09-24 DIAGNOSIS — Z8249 Family history of ischemic heart disease and other diseases of the circulatory system: Secondary | ICD-10-CM | POA: Diagnosis not present

## 2023-09-24 DIAGNOSIS — L97529 Non-pressure chronic ulcer of other part of left foot with unspecified severity: Secondary | ICD-10-CM

## 2023-09-24 DIAGNOSIS — I509 Heart failure, unspecified: Secondary | ICD-10-CM | POA: Diagnosis not present

## 2023-09-24 DIAGNOSIS — N1831 Chronic kidney disease, stage 3a: Secondary | ICD-10-CM | POA: Insufficient documentation

## 2023-09-24 DIAGNOSIS — I700 Atherosclerosis of aorta: Secondary | ICD-10-CM

## 2023-09-24 DIAGNOSIS — E1151 Type 2 diabetes mellitus with diabetic peripheral angiopathy without gangrene: Secondary | ICD-10-CM | POA: Insufficient documentation

## 2023-09-24 DIAGNOSIS — E11621 Type 2 diabetes mellitus with foot ulcer: Secondary | ICD-10-CM | POA: Diagnosis not present

## 2023-09-24 DIAGNOSIS — E1122 Type 2 diabetes mellitus with diabetic chronic kidney disease: Secondary | ICD-10-CM | POA: Insufficient documentation

## 2023-09-24 DIAGNOSIS — L97909 Non-pressure chronic ulcer of unspecified part of unspecified lower leg with unspecified severity: Secondary | ICD-10-CM | POA: Diagnosis not present

## 2023-09-24 DIAGNOSIS — I130 Hypertensive heart and chronic kidney disease with heart failure and stage 1 through stage 4 chronic kidney disease, or unspecified chronic kidney disease: Secondary | ICD-10-CM | POA: Insufficient documentation

## 2023-09-24 DIAGNOSIS — I70245 Atherosclerosis of native arteries of left leg with ulceration of other part of foot: Secondary | ICD-10-CM | POA: Diagnosis not present

## 2023-09-24 LAB — CREATININE, SERUM
Creatinine, Ser: 1.06 mg/dL — ABNORMAL HIGH (ref 0.44–1.00)
GFR, Estimated: 55 mL/min — ABNORMAL LOW (ref >60)

## 2023-09-24 LAB — GLUCOSE, CAPILLARY
Glucose-Capillary: 113 mg/dL — ABNORMAL HIGH (ref 70–99)
Glucose-Capillary: 131 mg/dL — ABNORMAL HIGH (ref 70–99)

## 2023-09-24 LAB — BUN: BUN: 20 mg/dL (ref 8–23)

## 2023-09-24 SURGERY — LOWER EXTREMITY INTERVENTION
Anesthesia: Moderate Sedation | Site: Leg Lower | Laterality: Left

## 2023-09-24 MED ORDER — MIDAZOLAM HCL 2 MG/ML PO SYRP: 8.0000 mg | ORAL_SOLUTION | Freq: Once | ORAL | Status: DC | PRN

## 2023-09-24 MED ORDER — ONDANSETRON HCL 4 MG/2ML IJ SOLN: 4.0000 mg | Freq: Four times a day (QID) | INTRAMUSCULAR | Status: DC | PRN

## 2023-09-24 MED ORDER — FENTANYL CITRATE (PF) 100 MCG/2ML IJ SOLN
INTRAMUSCULAR | Status: AC
  Filled 2023-09-24: qty 2

## 2023-09-24 MED ORDER — CEFAZOLIN SODIUM-DEXTROSE 2-4 GM/100ML-% IV SOLN
INTRAVENOUS | Status: AC
  Filled 2023-09-24: qty 100

## 2023-09-24 MED ORDER — FENTANYL CITRATE (PF) 100 MCG/2ML IJ SOLN
INTRAMUSCULAR | Status: DC | PRN
  Administered 2023-09-24 (×2): 25 ug via INTRAVENOUS
  Administered 2023-09-24 (×2): 50 ug via INTRAVENOUS
  Administered 2023-09-24 (×3): 25 ug via INTRAVENOUS

## 2023-09-24 MED ORDER — CLOPIDOGREL BISULFATE 300 MG PO TABS
300.0000 mg | ORAL_TABLET | ORAL | Status: AC
  Administered 2023-09-24: 300 mg via ORAL

## 2023-09-24 MED ORDER — HEPARIN SODIUM (PORCINE) 1000 UNIT/ML IJ SOLN
INTRAMUSCULAR | Status: DC | PRN
  Administered 2023-09-24: 5000 [IU] via INTRAVENOUS

## 2023-09-24 MED ORDER — HYDRALAZINE HCL 20 MG/ML IJ SOLN: 5.0000 mg | INTRAMUSCULAR | Status: DC | PRN

## 2023-09-24 MED ORDER — METHYLPREDNISOLONE SODIUM SUCC 125 MG IJ SOLR
125.0000 mg | Freq: Once | INTRAMUSCULAR | Status: AC | PRN
Start: 2023-09-24 — End: 2023-09-24
  Administered 2023-09-24: 125 mg via INTRAVENOUS

## 2023-09-24 MED ORDER — LIDOCAINE HCL (PF) 1 % IJ SOLN
INTRAMUSCULAR | Status: DC | PRN
  Administered 2023-09-24: 10 mL

## 2023-09-24 MED ORDER — CLOPIDOGREL BISULFATE 75 MG PO TABS
75.0000 mg | ORAL_TABLET | Freq: Every day | ORAL | 11 refills | Status: AC
Start: 2023-09-25 — End: ?

## 2023-09-24 MED ORDER — FAMOTIDINE 20 MG PO TABS
ORAL_TABLET | ORAL | Status: AC
  Filled 2023-09-24: qty 2

## 2023-09-24 MED ORDER — SODIUM CHLORIDE 0.9 % IV SOLN: INTRAVENOUS | Status: DC

## 2023-09-24 MED ORDER — DIPHENHYDRAMINE HCL 50 MG/ML IJ SOLN
50.0000 mg | Freq: Once | INTRAMUSCULAR | Status: AC | PRN
  Administered 2023-09-24: 50 mg via INTRAVENOUS

## 2023-09-24 MED ORDER — MIDAZOLAM HCL 5 MG/5ML IJ SOLN
INTRAMUSCULAR | Status: AC
  Filled 2023-09-24: qty 5

## 2023-09-24 MED ORDER — SODIUM CHLORIDE 0.9 % IV SOLN: 250.0000 mL | INTRAVENOUS | Status: DC | PRN

## 2023-09-24 MED ORDER — MIDAZOLAM HCL 2 MG/2ML IJ SOLN
INTRAMUSCULAR | Status: DC | PRN
  Administered 2023-09-24: .5 mg via INTRAVENOUS
  Administered 2023-09-24: 1 mg via INTRAVENOUS
  Administered 2023-09-24 (×4): .5 mg via INTRAVENOUS
  Administered 2023-09-24: 2 mg via INTRAVENOUS

## 2023-09-24 MED ORDER — CEFAZOLIN SODIUM-DEXTROSE 2-4 GM/100ML-% IV SOLN: 2.0000 g | INTRAVENOUS | Status: DC

## 2023-09-24 MED ORDER — IODIXANOL 320 MG/ML IV SOLN
INTRAVENOUS | Status: DC | PRN
  Administered 2023-09-24: 70 mL via INTRA_ARTERIAL

## 2023-09-24 MED ORDER — SODIUM CHLORIDE 0.9% FLUSH
3.0000 mL | Freq: Two times a day (BID) | INTRAVENOUS | Status: DC
Start: 2023-09-24 — End: 2023-09-24

## 2023-09-24 MED ORDER — FENTANYL CITRATE (PF) 100 MCG/2ML IJ SOLN
INTRAMUSCULAR | Status: AC
Start: 2023-09-24 — End: ?
  Filled 2023-09-24: qty 2

## 2023-09-24 MED ORDER — MIDAZOLAM HCL 2 MG/2ML IJ SOLN
INTRAMUSCULAR | Status: AC
  Filled 2023-09-24: qty 2

## 2023-09-24 MED ORDER — FAMOTIDINE 20 MG PO TABS
40.0000 mg | ORAL_TABLET | Freq: Once | ORAL | Status: AC | PRN
  Administered 2023-09-24: 40 mg via ORAL

## 2023-09-24 MED ORDER — CLOPIDOGREL BISULFATE 75 MG PO TABS
ORAL_TABLET | ORAL | Status: AC
Start: 2023-09-24 — End: ?
  Filled 2023-09-24: qty 4

## 2023-09-24 MED ORDER — HYDROMORPHONE HCL 1 MG/ML IJ SOLN: 1.0000 mg | Freq: Once | INTRAMUSCULAR | Status: DC | PRN

## 2023-09-24 MED ORDER — HEPARIN SODIUM (PORCINE) 1000 UNIT/ML IJ SOLN
INTRAMUSCULAR | Status: AC
Start: 2023-09-24 — End: ?
  Filled 2023-09-24: qty 10

## 2023-09-24 MED ORDER — LABETALOL HCL 5 MG/ML IV SOLN: 10.0000 mg | INTRAVENOUS | Status: DC | PRN

## 2023-09-24 MED ORDER — METHYLPREDNISOLONE SODIUM SUCC 125 MG IJ SOLR
INTRAMUSCULAR | Status: AC
Start: 2023-09-24 — End: ?
  Filled 2023-09-24: qty 2

## 2023-09-24 MED ORDER — DIPHENHYDRAMINE HCL 50 MG/ML IJ SOLN
INTRAMUSCULAR | Status: AC
  Filled 2023-09-24: qty 1

## 2023-09-24 MED ORDER — SODIUM CHLORIDE 0.9% FLUSH: 3.0000 mL | INTRAVENOUS | Status: DC | PRN

## 2023-09-24 MED ORDER — HEPARIN (PORCINE) IN NACL 1000-0.9 UT/500ML-% IV SOLN
INTRAVENOUS | Status: DC | PRN
  Administered 2023-09-24: 1000 mL

## 2023-09-24 SURGICAL SUPPLY — 35 items
BALLN LUTONIX 018 4X300X130 (BALLOONS) ×1 IMPLANT
BALLN LUTONIX 018 6X60X130 (BALLOONS) ×1 IMPLANT
BALLN LUTONIX 5X220X130 (BALLOONS) ×1 IMPLANT
BALLN LUTONIX DCB 6X100X130 (BALLOONS) ×1 IMPLANT
BALLN LUTONIX DCB 7X60X130 (BALLOONS) ×1 IMPLANT
BALLOON LUTONIX 018 4X300X130 (BALLOONS) IMPLANT
BALLOON LUTONIX 018 6X60X130 (BALLOONS) IMPLANT
BALLOON LUTONIX 5X220X130 (BALLOONS) IMPLANT
BALLOON LUTONIX DCB 6X100X130 (BALLOONS) IMPLANT
BALLOON LUTONIX DCB 7X60X130 (BALLOONS) IMPLANT
CATH ANGIO 5F PIGTAIL 65CM (CATHETERS) IMPLANT
CATH BEACON 5 .035 65 KMP TIP (CATHETERS) IMPLANT
CATH BEACON 5 .038 100 VERT TP (CATHETERS) IMPLANT
CATH ROTAREX 135 6FR (CATHETERS) IMPLANT
CATH TEMPO 5F RIM 65CM (CATHETERS) IMPLANT
COVER PROBE ULTRASOUND 5X96 (MISCELLANEOUS) IMPLANT
DEVICE PRESTO INFLATION (MISCELLANEOUS) IMPLANT
DEVICE STARCLOSE SE CLOSURE (Vascular Products) IMPLANT
GLIDEWIRE ADV .035X260CM (WIRE) IMPLANT
GOWN STRL REUS W/ TWL LRG LVL3 (GOWN DISPOSABLE) ×1 IMPLANT
GUIDEWIRE SUPER STIFF .035X180 (WIRE) IMPLANT
LIFESTENT SOLO 6X200X135 (Permanent Stent) IMPLANT
NDL ENTRY 21GA 7CM ECHOTIP (NEEDLE) IMPLANT
NEEDLE ENTRY 21GA 7CM ECHOTIP (NEEDLE) ×1 IMPLANT
PACK ANGIOGRAPHY (CUSTOM PROCEDURE TRAY) ×1 IMPLANT
SET INTRO CAPELLA COAXIAL (SET/KITS/TRAYS/PACK) IMPLANT
SHEATH ANL2 6FRX45 HC (SHEATH) IMPLANT
SHEATH BRITE TIP 5FRX11 (SHEATH) IMPLANT
STENT LIFESTENT 5F 6X80X135 (Permanent Stent) IMPLANT
STENT LIFESTENT 5F 7X60X135 (Permanent Stent) IMPLANT
SYR MEDRAD MARK 7 150ML (SYRINGE) IMPLANT
TUBING CONTRAST HIGH PRESS 72 (TUBING) IMPLANT
WIRE G V18X300CM (WIRE) IMPLANT
WIRE J 3MM .035X145CM (WIRE) IMPLANT
WIRE SUPRACORE 300CM (WIRE) IMPLANT

## 2023-09-24 NOTE — Op Note (Signed)
 [facility]  Percutaneous Study/Intervention Procedural Note   Date of Surgery: 09/24/2023  Surgeon:  Renford Dills, MD.  Pre-operative Diagnosis: Atherosclerotic occlusive disease bilateral lower extremities with ulceration of the left foot  Post-operative diagnosis:  Same  Procedure(s) Performed:             1.  Introduction catheter into left lower extremity 3rd order catheter placement              2.    Contrast injection left lower extremity for distal runoff             3.  Thrombectomy left SFA with the [place] Rex catheter.               4.   Percutaneous transluminal angioplasty and stent placement left superficial femoral artery              5.  Star close closure right common femoral arteriotomy  Anesthesia: Conscious sedation was administered under my direct supervision by the interventional radiology RN. IV Versed plus fentanyl were utilized. Continuous ECG, pulse oximetry and blood pressure was monitored throughout the entire procedure.  Conscious sedation was for a total of 102 minutes.  Sheath: 6 [place] Ansell right common femoral retrograde  Contrast: 70 cc  Fluoroscopy Time: 15.6 minutes  Indications:  Saylor Murry Melfi presents with a poorly healing wound of the left foot.  Physical examination as well as noninvasive studies suggest atherosclerotic occlusive disease.  Angiography with hope for intervention for limb salvage is been recommended.  The risks and benefits are reviewed all questions answered patient agrees to proceed.  Procedure:  Christella App Balcom is a 73 y.o. y.o. female who was identified and appropriate procedural time out was performed.  The patient was then placed supine on the table and prepped and draped in the usual sterile fashion.    Ultrasound was placed in the sterile sleeve and the right groin was evaluated the right common femoral artery was echolucent and pulsatile indicating patency.  Image was  recorded for the permanent record and under real-time visualization a microneedle was inserted into the common femoral artery microwire followed by a micro-sheath.  A J-wire was then advanced through the micro-sheath and a  5 [place] sheath was then inserted over a J-wire. J-wire was then advanced and a 5 French pigtail catheter was positioned at the level of T12. AP projection of the aorta was then obtained. Pigtail catheter was repositioned to above the bifurcation and a RAO view of the pelvis was obtained.  Subsequently a rim catheter with the stiff angle Glidewire was used to cross the aortic bifurcation the catheter wire were advanced down into the left distal external iliac artery.  Rim catheter was exchanged for the pigtail catheter and an LAO view of the femoral bifurcation was then obtained and subsequently the wire was reintroduced and the pigtail catheter negotiated into the profunda femoris representing third order catheter placement. Distal runoff was then performed.  6000 units of heparin was then given and allowed to circulate and a 6 [place] Ansell sheath was advanced up and over the bifurcation and positioned in the mid common femoral artery  KMP  catheter and advantage Glidewire were then negotiated down into the distal popliteal.  Intraluminal positioning was confirmed by hand injection through the Kumpe catheter.  V18 wire was then introduced through the Kumpe catheter and the [place] Rex device prepped on the field.  It is then advanced through  the occlusion performing a single complete pass but with several segments of multiple passes within the length that is being treated.  Follow-up imaging now demonstrates successful recanalization from the common femoral to the above-knee popliteal but there is still diffuse areas of greater than 60% throughout the previously occluded segment.  This represents successful thrombectomy to reestablish flow.  Working with the V18 wire a 4 mm x 300 mm balloon  was used to angioplasty the superficial femoral and above-knee popliteal arteries. Inflation were to 10 atmospheres for 1 minute. Follow-up imaging demonstrated patency with multiple areas greater than 50% residual stenosis.   Working from the above-knee popliteal Hunter's canal a 6 mm x 200 mm life stent is deployed and this is postdilated with a 5 mm x 220 mm Lutonix drug-eluting balloon inflated to 8 atm for approximately 1 minute.  More proximally a 6 mm x 80 mm life stent is deployed and this is then postdilated with a 6 mm x 100 mm Lutonix drug-eluting balloon inflated to 8 atm for approximately 1 minute.  Lastly a 7 mm x 60 mm stent is deployed so that its leading edge is positioned right at the origin of the superficial femoral artery.  A 7 mm x 60 mm balloon inflated to 8 atm for 1 minute.  Follow-up imaging now demonstrated wide patency of the SFA and above-knee popliteal the stent is right at the origin of the SFA.  There is less than 10% residual stenosis throughout.  Distal runoff is preserved.  After review of these images the sheath is pulled into the right external iliac oblique of the common femoral is obtained and a Star close device deployed. There no immediate complications.   Findings:  The abdominal aorta is opacified with a bolus injection contrast. Renal arteries are single and appear widely patent. The aorta itself has diffuse disease but no hemodynamically significant lesions. The common and external iliac arteries are widely patent bilaterally.  The left common femoral is widely patent as is the profunda femoris.  The SFA demonstrates a flush occlusion with the common femoral.  The above-knee popliteal is reconstituted by profunda collaterals and appears free of hemodynamically significant stenosis.  Trifurcation is patent and there is three-vessel runoff to the foot although the anterior tibial is somewhat delayed in the dorsalis pedis although it fills is quite small.  The  posterior tibial is a dominant runoff filling the plantar arteries and the entire pedal arch quite nicely.  Following thrombectomy with the [place] Rex device there is now recanalization of the occluded SFA and above-knee popliteal  Following angioplasty and stent placement the SFA and above-knee popliteal is now patent with in-line flow and looks quite nice with less than 10% residual stenosis.     Summary: Successful recanalization left lower extremity for limb salvage                        Disposition: Patient was taken to the recovery room in stable condition having tolerated the procedure well.  Sarina Robleto, Latina Craver 09/24/2023,10:33 AM

## 2023-09-24 NOTE — Interval H&P Note (Signed)
 History and Physical Interval Note:  09/24/2023 8:01 AM  Kendra Walton  has presented today for surgery, with the diagnosis of LLE Angio   ASO w ulceration.  The various methods of treatment have been discussed with the patient and family. After consideration of risks, benefits and other options for treatment, the patient has consented to  Procedure(s): LOWER EXTREMITY INTERVENTION (Left) Lower Extremity Angiography (Left) as a surgical intervention.  The patient's history has been reviewed, patient examined, no change in status, stable for surgery.  I have reviewed the patient's chart and labs.  Questions were answered to the patient's satisfaction.     [name]

## 2023-09-27 ENCOUNTER — Telehealth (INDEPENDENT_AMBULATORY_CARE_PROVIDER_SITE_OTHER): Payer: Self-pay

## 2023-09-27 NOTE — Telephone Encounter (Signed)
 Patient left a message stating that she had a left le angio on 09/24/23. Patient would like to know more about the procedure. Please Advise

## 2023-09-27 NOTE — Telephone Encounter (Signed)
 Attempted to call patient but she didn't answer.  Advised that her procedure would be discussed at her follow up visit, but she is welcome to call back

## 2023-09-30 DIAGNOSIS — L03032 Cellulitis of left toe: Secondary | ICD-10-CM | POA: Diagnosis not present

## 2023-09-30 DIAGNOSIS — E1142 Type 2 diabetes mellitus with diabetic polyneuropathy: Secondary | ICD-10-CM | POA: Diagnosis not present

## 2023-09-30 DIAGNOSIS — L97524 Non-pressure chronic ulcer of other part of left foot with necrosis of bone: Secondary | ICD-10-CM | POA: Diagnosis not present

## 2023-09-30 DIAGNOSIS — I739 Peripheral vascular disease, unspecified: Secondary | ICD-10-CM | POA: Diagnosis not present

## 2023-10-02 ENCOUNTER — Other Ambulatory Visit: Payer: Self-pay | Admitting: Podiatry

## 2023-10-03 ENCOUNTER — Other Ambulatory Visit: Payer: Self-pay

## 2023-10-03 ENCOUNTER — Encounter
Admission: RE | Admit: 2023-10-03 | Discharge: 2023-10-03 | Disposition: A | Discharge status: Home / Self Care | Source: Ambulatory Visit | Attending: Podiatry | Admitting: Podiatry

## 2023-10-03 DIAGNOSIS — Z01818 Encounter for other preprocedural examination: Secondary | ICD-10-CM

## 2023-10-03 DIAGNOSIS — Z79899 Other long term (current) drug therapy: Secondary | ICD-10-CM

## 2023-10-03 DIAGNOSIS — I10 Essential (primary) hypertension: Secondary | ICD-10-CM

## 2023-10-03 DIAGNOSIS — I700 Atherosclerosis of aorta: Secondary | ICD-10-CM

## 2023-10-03 DIAGNOSIS — Z01812 Encounter for preprocedural laboratory examination: Secondary | ICD-10-CM

## 2023-10-03 DIAGNOSIS — I350 Nonrheumatic aortic (valve) stenosis: Secondary | ICD-10-CM

## 2023-10-03 DIAGNOSIS — Z01810 Encounter for preprocedural cardiovascular examination: Secondary | ICD-10-CM

## 2023-10-03 HISTORY — DX: Osteomyelitis, unspecified: M86.9

## 2023-10-03 NOTE — Patient Instructions (Signed)
 Your procedure is scheduled on:10-04-23 Friday Report to the Registration Desk on the 1st floor of the [facility]. Then proceed to the 2nd floor Surgery Desk. Arrive at 9:30 AM If your arrival time is 6:00 am, do not arrive before that time as the [facility] entrance doors do not open until 6:00 am.  REMEMBER: Instructions that are not followed completely may result in serious medical risk, up to and including death; or upon the discretion of your surgeon and anesthesiologist your surgery may need to be rescheduled.  Do not eat food after midnight the night before surgery.  No gum chewing or hard candies.  You may however, drink Water up to 2 hours before you are scheduled to arrive for your surgery. Do not drink anything within 2 hours of your scheduled arrival time.  One week prior to surgery:Stop NOW (10-03-23) Stop Anti-inflammatories (NSAIDS) such as Advil, Aleve, Ibuprofen, Motrin, Naproxen, Naprosyn and Aspirin based products such as Excedrin, Goody's Powder, BC Powder.  You may however, continue to take Tylenol/Tramadol if needed for pain up until the day of surgery.  Last dose of clopidogrel (PLAVIX) was on 10-02-23 as instructed by Dr Earney Mallet office  Stop metFORMIN Welford Roche) NOW (10-03-23)  Continue taking all of your other prescription medications up until the day of surgery.  ON THE DAY OF SURGERY ONLY TAKE THESE MEDICATIONS WITH SIPS OF WATER: -atorvastatin (LIPITOR)  -levothyroxine (SYNTHROID)  -loratadine (CLARITIN)  -pregabalin (LYRICA)   Continue your 81 mg Aspirin up until the day prior to surgery-Do NOT take the morning of surgery  No Alcohol for 24 hours before or after surgery.  No Smoking including e-cigarettes for 24 hours before surgery.  No chewable tobacco products for at least 6 hours before surgery.  No nicotine patches on the day of surgery.  Do not use any "recreational" drugs for at least a week (preferably 2 weeks) before your surgery.   Please be advised that the combination of cocaine and anesthesia may have negative outcomes, up to and including death. If you test positive for cocaine, your surgery will be cancelled.  On the morning of surgery brush your teeth with toothpaste and water, you may rinse your mouth with mouthwash if you wish. Do not swallow any toothpaste or mouthwash.  Do not wear jewelry, make-up, hairpins, clips or nail polish.  For welded (permanent) jewelry: bracelets, anklets, waist bands, etc.  Please have this removed prior to surgery.  If it is not removed, there is a chance that hospital personnel will need to cut it off on the day of surgery.  Do not wear lotions, powders, or perfumes.   Do not shave body hair from the neck down 48 hours before surgery.  Contact lenses, hearing aids and dentures may not be worn into surgery.  Do not bring valuables to the hospital. [facility] is not responsible for any missing/lost belongings or valuables.   Notify your doctor if there is any change in your medical condition (cold, fever, infection).  Wear comfortable clothing (specific to your surgery type) to the hospital.  After surgery, you can help prevent lung complications by doing breathing exercises.  Take deep breaths and cough every 1-2 hours. Your doctor may order a device called an Incentive Spirometer to help you take deep breaths. When coughing or sneezing, hold a pillow firmly against your incision with both hands. This is called "splinting." Doing this helps protect your incision. It also decreases belly discomfort.  If you are being admitted  to the hospital overnight, leave your suitcase in the car. After surgery it may be brought to your room.  In case of increased patient census, it may be necessary for you, the patient, to continue your postoperative care in the Same Day Surgery department.  If you are being discharged the day of surgery, you will not be allowed to drive home. You will  need a responsible individual to drive you home and stay with you for 24 hours after surgery.   If you are taking public transportation, you will need to have a responsible individual with you.  Please call the Pre-admissions Testing Dept. at [phone] if you have any questions about these instructions.  Surgery Visitation Policy:  Patients having surgery or a procedure may have two visitors.  Children under the age of 60 must have an adult with them who is not the patient.  How to Use an Incentive Spirometer An incentive spirometer is a tool that measures how well you are filling your lungs with each breath. Learning to take long, deep breaths using this tool can help you keep your lungs clear and active. This may help to reverse or lessen your chance of developing breathing (pulmonary) problems, especially infection. You may be asked to use a spirometer: After a surgery. If you have a lung problem or a history of smoking. After a long period of time when you have been unable to move or be active. If the spirometer includes an indicator to show the highest number that you have reached, your health care provider or respiratory therapist will help you set a goal. Keep a log of your progress as told by your health care provider. What are the risks? Breathing too quickly may cause dizziness or cause you to pass out. Take your time so you do not get dizzy or light-headed. If you are in pain, you may need to take pain medicine before doing incentive spirometry. It is harder to take a deep breath if you are having pain. How to use your incentive spirometer  Sit up on the edge of your bed or on a chair. Hold the incentive spirometer so that it is in an upright position. Before you use the spirometer, breathe out normally. Place the mouthpiece in your mouth. Make sure your lips are closed tightly around it. Breathe in slowly and as deeply as you can through your mouth, causing the piston or  the ball to rise toward the top of the chamber. Hold your breath for 3-5 seconds, or for as long as possible. If the spirometer includes a coach indicator, use this to guide you in breathing. Slow down your breathing if the indicator goes above the marked areas. Remove the mouthpiece from your mouth and breathe out normally. The piston or ball will return to the bottom of the chamber. Rest for a few seconds, then repeat the steps 10 or more times. Take your time and take a few normal breaths between deep breaths so that you do not get dizzy or light-headed. Do this every 1-2 hours when you are awake. If the spirometer includes a goal marker to show the highest number you have reached (best effort), use this as a goal to work toward during each repetition. After each set of 10 deep breaths, cough a few times. This will help to make sure that your lungs are clear. If you have an incision on your chest or abdomen from surgery, place a pillow or a rolled-up towel firmly  against the incision when you cough. This can help to reduce pain while taking deep breaths and coughing. General tips When you are able to get out of bed: Walk around often. Continue to take deep breaths and cough in order to clear your lungs. Keep using the incentive spirometer until your health care provider says it is okay to stop using it. If you have been in the hospital, you may be told to keep using the spirometer at home. Contact a health care provider if: You are having difficulty using the spirometer. You have trouble using the spirometer as often as instructed. Your pain medicine is not giving enough relief for you to use the spirometer as told. You have a fever. Get help right away if: You develop shortness of breath. You develop a cough with bloody mucus from the lungs. You have fluid or blood coming from an incision site after you cough. Summary An incentive spirometer is a tool that can help you learn to take  long, deep breaths to keep your lungs clear and active. You may be asked to use a spirometer after a surgery, if you have a lung problem or a history of smoking, or if you have been inactive for a long period of time. Use your incentive spirometer as instructed every 1-2 hours while you are awake. If you have an incision on your chest or abdomen, place a pillow or a rolled-up towel firmly against your incision when you cough. This will help to reduce pain. Get help right away if you have shortness of breath, you cough up bloody mucus, or blood comes from your incision when you cough. This information is not intended to replace advice given to you by your health care provider. Make sure you discuss any questions you have with your health care provider. Document Revised: 04/12/2023 Document Reviewed: 04/12/2023 Elsevier Patient Education  2024 [employer].

## 2023-10-04 ENCOUNTER — Other Ambulatory Visit: Payer: Self-pay

## 2023-10-04 ENCOUNTER — Ambulatory Visit: Admitting: Anesthesiology

## 2023-10-04 ENCOUNTER — Ambulatory Visit
Admission: RE | Admit: 2023-10-04 | Discharge: 2023-10-04 | Disposition: A | Discharge status: Home / Self Care | Attending: Podiatry | Admitting: Podiatry

## 2023-10-04 ENCOUNTER — Encounter: Payer: Self-pay | Admitting: Podiatry

## 2023-10-04 ENCOUNTER — Encounter
Admission: RE | Disposition: A | Discharge status: Home / Self Care | Payer: Self-pay | Source: Home / Self Care | Attending: Podiatry

## 2023-10-04 DIAGNOSIS — I96 Gangrene, not elsewhere classified: Secondary | ICD-10-CM | POA: Diagnosis not present

## 2023-10-04 DIAGNOSIS — M868X7 Other osteomyelitis, ankle and foot: Secondary | ICD-10-CM | POA: Insufficient documentation

## 2023-10-04 DIAGNOSIS — E1122 Type 2 diabetes mellitus with diabetic chronic kidney disease: Secondary | ICD-10-CM | POA: Insufficient documentation

## 2023-10-04 DIAGNOSIS — Z952 Presence of prosthetic heart valve: Secondary | ICD-10-CM | POA: Insufficient documentation

## 2023-10-04 DIAGNOSIS — E11621 Type 2 diabetes mellitus with foot ulcer: Secondary | ICD-10-CM | POA: Diagnosis not present

## 2023-10-04 DIAGNOSIS — Z7984 Long term (current) use of oral hypoglycemic drugs: Secondary | ICD-10-CM | POA: Diagnosis not present

## 2023-10-04 DIAGNOSIS — Z87891 Personal history of nicotine dependence: Secondary | ICD-10-CM | POA: Insufficient documentation

## 2023-10-04 DIAGNOSIS — I350 Nonrheumatic aortic (valve) stenosis: Secondary | ICD-10-CM

## 2023-10-04 DIAGNOSIS — Z7902 Long term (current) use of antithrombotics/antiplatelets: Secondary | ICD-10-CM | POA: Insufficient documentation

## 2023-10-04 DIAGNOSIS — I10 Essential (primary) hypertension: Secondary | ICD-10-CM

## 2023-10-04 DIAGNOSIS — Z79899 Other long term (current) drug therapy: Secondary | ICD-10-CM

## 2023-10-04 DIAGNOSIS — Z01818 Encounter for other preprocedural examination: Secondary | ICD-10-CM

## 2023-10-04 DIAGNOSIS — E1142 Type 2 diabetes mellitus with diabetic polyneuropathy: Secondary | ICD-10-CM | POA: Diagnosis not present

## 2023-10-04 DIAGNOSIS — Z01810 Encounter for preprocedural cardiovascular examination: Secondary | ICD-10-CM | POA: Diagnosis not present

## 2023-10-04 DIAGNOSIS — L03032 Cellulitis of left toe: Secondary | ICD-10-CM | POA: Diagnosis not present

## 2023-10-04 DIAGNOSIS — I700 Atherosclerosis of aorta: Secondary | ICD-10-CM

## 2023-10-04 DIAGNOSIS — Z6837 Body mass index (BMI) 37.0-37.9, adult: Secondary | ICD-10-CM | POA: Insufficient documentation

## 2023-10-04 DIAGNOSIS — N183 Chronic kidney disease, stage 3 unspecified: Secondary | ICD-10-CM | POA: Diagnosis not present

## 2023-10-04 DIAGNOSIS — Z01812 Encounter for preprocedural laboratory examination: Secondary | ICD-10-CM

## 2023-10-04 DIAGNOSIS — L97529 Non-pressure chronic ulcer of other part of left foot with unspecified severity: Secondary | ICD-10-CM | POA: Diagnosis not present

## 2023-10-04 DIAGNOSIS — E1152 Type 2 diabetes mellitus with diabetic peripheral angiopathy with gangrene: Secondary | ICD-10-CM | POA: Diagnosis not present

## 2023-10-04 DIAGNOSIS — L97524 Non-pressure chronic ulcer of other part of left foot with necrosis of bone: Secondary | ICD-10-CM | POA: Diagnosis not present

## 2023-10-04 DIAGNOSIS — E039 Hypothyroidism, unspecified: Secondary | ICD-10-CM | POA: Diagnosis not present

## 2023-10-04 DIAGNOSIS — E669 Obesity, unspecified: Secondary | ICD-10-CM | POA: Insufficient documentation

## 2023-10-04 DIAGNOSIS — I509 Heart failure, unspecified: Secondary | ICD-10-CM | POA: Diagnosis not present

## 2023-10-04 DIAGNOSIS — I130 Hypertensive heart and chronic kidney disease with heart failure and stage 1 through stage 4 chronic kidney disease, or unspecified chronic kidney disease: Secondary | ICD-10-CM | POA: Diagnosis not present

## 2023-10-04 LAB — BASIC METABOLIC PANEL WITH GFR
Anion gap: 9 (ref 5–15)
BUN: 29 mg/dL — ABNORMAL HIGH (ref 8–23)
CO2: 23 mmol/L (ref 22–32)
Calcium: 8.5 mg/dL — ABNORMAL LOW (ref 8.9–10.3)
Chloride: 104 mmol/L (ref 98–111)
Creatinine, Ser: 1.38 mg/dL — ABNORMAL HIGH (ref 0.44–1.00)
GFR, Estimated: 40 mL/min — ABNORMAL LOW (ref >60)
Glucose, Bld: 118 mg/dL — ABNORMAL HIGH (ref 70–99)
Potassium: 4.8 mmol/L (ref 3.5–5.1)
Sodium: 136 mmol/L (ref 135–145)

## 2023-10-04 LAB — GLUCOSE, CAPILLARY: Glucose-Capillary: 103 mg/dL — ABNORMAL HIGH (ref 70–99)

## 2023-10-04 SURGERY — AMPUTATION, TOE
Anesthesia: General | Site: Toe | Laterality: Left

## 2023-10-04 MED ORDER — LACTATED RINGERS IV SOLN: INTRAVENOUS | Status: DC

## 2023-10-04 MED ORDER — LIDOCAINE HCL (CARDIAC) PF 100 MG/5ML IV SOSY
PREFILLED_SYRINGE | INTRAVENOUS | Status: DC | PRN
  Administered 2023-10-04: 100 mg via INTRAVENOUS

## 2023-10-04 MED ORDER — CHLORHEXIDINE GLUCONATE 0.12 % MT SOLN
15.0000 mL | Freq: Once | OROMUCOSAL | Status: AC
  Administered 2023-10-04: 15 mL via OROMUCOSAL

## 2023-10-04 MED ORDER — PROPOFOL 500 MG/50ML IV EMUL
INTRAVENOUS | Status: DC | PRN
  Administered 2023-10-04: 80 ug/kg/min via INTRAVENOUS

## 2023-10-04 MED ORDER — ONDANSETRON HCL 4 MG/2ML IJ SOLN
INTRAMUSCULAR | Status: AC
  Filled 2023-10-04: qty 2

## 2023-10-04 MED ORDER — BUPIVACAINE HCL 0.5 % IJ SOLN
INTRAMUSCULAR | Status: DC | PRN
  Administered 2023-10-04: 10 mL

## 2023-10-04 MED ORDER — ACETAMINOPHEN 10 MG/ML IV SOLN: 1000.0000 mg | Freq: Once | INTRAVENOUS | Status: DC | PRN

## 2023-10-04 MED ORDER — PROPOFOL 10 MG/ML IV BOLUS
INTRAVENOUS | Status: AC
Start: 2023-10-04 — End: ?
  Filled 2023-10-04: qty 20

## 2023-10-04 MED ORDER — SODIUM CHLORIDE 0.9 % IV SOLN
INTRAVENOUS | Status: DC
Start: 2023-10-04 — End: 2023-10-04

## 2023-10-04 MED ORDER — CEFAZOLIN SODIUM-DEXTROSE 2-4 GM/100ML-% IV SOLN
INTRAVENOUS | Status: AC
  Filled 2023-10-04: qty 100

## 2023-10-04 MED ORDER — OXYCODONE-ACETAMINOPHEN 5-325 MG PO TABS
1.0000 | ORAL_TABLET | Freq: Four times a day (QID) | ORAL | 0 refills | Status: DC | PRN
Start: 2023-10-04 — End: 2023-10-29

## 2023-10-04 MED ORDER — OXYCODONE HCL 5 MG/5ML PO SOLN: 5.0000 mg | Freq: Once | ORAL | Status: DC | PRN

## 2023-10-04 MED ORDER — CHLORHEXIDINE GLUCONATE 0.12 % MT SOLN
OROMUCOSAL | Status: AC
  Filled 2023-10-04: qty 15

## 2023-10-04 MED ORDER — MIDAZOLAM HCL 2 MG/2ML IJ SOLN
INTRAMUSCULAR | Status: AC
  Filled 2023-10-04: qty 2

## 2023-10-04 MED ORDER — PROPOFOL 10 MG/ML IV BOLUS
INTRAVENOUS | Status: AC
  Filled 2023-10-04: qty 20

## 2023-10-04 MED ORDER — ORAL CARE MOUTH RINSE: 15.0000 mL | Freq: Once | OROMUCOSAL | Status: AC

## 2023-10-04 MED ORDER — 0.9 % SODIUM CHLORIDE (POUR BTL) OPTIME
TOPICAL | Status: DC | PRN
  Administered 2023-10-04: 500 mL

## 2023-10-04 MED ORDER — ONDANSETRON HCL 4 MG/2ML IJ SOLN: 4.0000 mg | Freq: Once | INTRAMUSCULAR | Status: DC | PRN

## 2023-10-04 MED ORDER — FENTANYL CITRATE (PF) 100 MCG/2ML IJ SOLN: 25.0000 ug | INTRAMUSCULAR | Status: DC | PRN

## 2023-10-04 MED ORDER — ONDANSETRON HCL 4 MG/2ML IJ SOLN
INTRAMUSCULAR | Status: DC | PRN
  Administered 2023-10-04: 4 mg via INTRAVENOUS

## 2023-10-04 MED ORDER — PHENYLEPHRINE 80 MCG/ML (10ML) SYRINGE FOR IV PUSH (FOR BLOOD PRESSURE SUPPORT)
PREFILLED_SYRINGE | INTRAVENOUS | Status: DC | PRN
  Administered 2023-10-04: 80 ug via INTRAVENOUS

## 2023-10-04 MED ORDER — OXYCODONE HCL 5 MG PO TABS: 5.0000 mg | ORAL_TABLET | Freq: Once | ORAL | Status: DC | PRN

## 2023-10-04 MED ORDER — CEFAZOLIN SODIUM-DEXTROSE 2-4 GM/100ML-% IV SOLN
2.0000 g | INTRAVENOUS | Status: AC
  Administered 2023-10-04: 2 g via INTRAVENOUS

## 2023-10-04 MED ORDER — LIDOCAINE HCL (PF) 2 % IJ SOLN
INTRAMUSCULAR | Status: AC
  Filled 2023-10-04: qty 5

## 2023-10-04 MED ORDER — MIDAZOLAM HCL 2 MG/2ML IJ SOLN
INTRAMUSCULAR | Status: DC | PRN
  Administered 2023-10-04: 2 mg via INTRAVENOUS

## 2023-10-04 SURGICAL SUPPLY — 47 items
BLADE OSC/SAGITTAL MD 5.5X18 (BLADE) ×1 IMPLANT
BLADE SURG MINI STRL (BLADE) ×1 IMPLANT
BNDG COHESIVE 4X5 TAN STRL LF (GAUZE/BANDAGES/DRESSINGS) ×1 IMPLANT
BNDG ELASTIC 4X5.8 VLCR NS LF (GAUZE/BANDAGES/DRESSINGS) ×1 IMPLANT
BNDG ESMARCH 4X12 STRL LF (GAUZE/BANDAGES/DRESSINGS) ×1 IMPLANT
BNDG GAUZE DERMACEA FLUFF 4 (GAUZE/BANDAGES/DRESSINGS) ×1 IMPLANT
BNDG STRETCH GAUZE 3IN X12FT (GAUZE/BANDAGES/DRESSINGS) ×2 IMPLANT
CUFF TOURN SGL QUICK 12 (TOURNIQUET CUFF) IMPLANT
CUFF TOURN SGL QUICK 18X4 (TOURNIQUET CUFF) IMPLANT
DRAPE FLUOR MINI C-ARM 54X84 (DRAPES) ×1 IMPLANT
DRAPE XRAY CASSETTE 23X24 (DRAPES) ×1 IMPLANT
DURAPREP 26ML APPLICATOR (WOUND CARE) ×1 IMPLANT
ELECT REM PT RETURN 9FT ADLT (ELECTROSURGICAL) ×1 IMPLANT
ELECTRODE REM PT RTRN 9FT ADLT (ELECTROSURGICAL) ×1 IMPLANT
GAUZE PACKING IODOFORM 1/2INX (GAUZE/BANDAGES/DRESSINGS) ×1 IMPLANT
GAUZE SPONGE 4X4 12PLY STRL (GAUZE/BANDAGES/DRESSINGS) ×1 IMPLANT
GAUZE STRETCH 2X75IN STRL (MISCELLANEOUS) ×1 IMPLANT
GAUZE XEROFORM 1X8 LF (GAUZE/BANDAGES/DRESSINGS) ×1 IMPLANT
GLOVE BIO SURGEON STRL SZ7.5 (GLOVE) ×1 IMPLANT
GLOVE INDICATOR 8.0 STRL GRN (GLOVE) ×1 IMPLANT
GOWN STRL REUS W/ TWL XL LVL3 (GOWN DISPOSABLE) ×1 IMPLANT
GOWN STRL REUS W/TWL XL LVL4 (GOWN DISPOSABLE) ×1 IMPLANT
KIT TURNOVER KIT A (KITS) ×1 IMPLANT
LABEL OR SOLS (LABEL) ×1 IMPLANT
MANIFOLD NEPTUNE II (INSTRUMENTS) ×1 IMPLANT
NDL FILTER BLUNT 18X1 1/2 (NEEDLE) ×1 IMPLANT
NDL HYPO 25X1 1.5 SAFETY (NEEDLE) ×1 IMPLANT
NEEDLE FILTER BLUNT 18X1 1/2 (NEEDLE) ×1 IMPLANT
NEEDLE HYPO 25X1 1.5 SAFETY (NEEDLE) ×1 IMPLANT
NS IRRIG 500ML POUR BTL (IV SOLUTION) ×1 IMPLANT
PACK EXTREMITY ARMC (MISCELLANEOUS) ×1 IMPLANT
PAD ABD DERMACEA PRESS 5X9 (GAUZE/BANDAGES/DRESSINGS) ×2 IMPLANT
PENCIL SMOKE EVACUATOR (MISCELLANEOUS) IMPLANT
PULSAVAC PLUS IRRIG FAN TIP (DISPOSABLE) IMPLANT
SHIELD FULL FACE ANTIFOG 7M (MISCELLANEOUS) ×1 IMPLANT
SOL .9 NS 3000ML IRR UROMATIC (IV SOLUTION) ×1 IMPLANT
STOCKINETTE IMPERV 14X48 (MISCELLANEOUS) ×1 IMPLANT
STOCKINETTE M/LG 89821 (MISCELLANEOUS) ×1 IMPLANT
STRAP SAFETY 5IN WIDE (MISCELLANEOUS) ×1 IMPLANT
SUT ETHILON 3-0 FS-10 30 BLK (SUTURE) ×1 IMPLANT
SUT ETHILON 5-0 FS-2 18 BLK (SUTURE) ×1 IMPLANT
SUTURE EHLN 3-0 FS-10 30 BLK (SUTURE) ×1 IMPLANT
SWAB CULTURE AMIES ANAERIB BLU (MISCELLANEOUS) IMPLANT
SYR 10ML LL (SYRINGE) ×3 IMPLANT
TIP FAN IRRIG PULSAVAC PLUS (DISPOSABLE) ×1 IMPLANT
TRAP FLUID SMOKE EVACUATOR (MISCELLANEOUS) ×1 IMPLANT
WATER STERILE IRR 500ML POUR (IV SOLUTION) ×1 IMPLANT

## 2023-10-04 NOTE — Op Note (Signed)
 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  Local consists of a total of 8 cc of 0.5% plain bupivacaine  and a digital block fashion    Hemostasis: None    Specimen: Distal phalanx left great toe for pathology and deep wound culture for aerobic and anaerobic culture    Complications: None    Operative indications:Kendra Walton is an 73 y.o. that presents today for surgical intervention.  The risks/benefits/alternatives/complications have been discussed and consent has been given.    Procedure:  Patient was brought into the OR and placed on the operating table in thesupine position. After anesthesia was obtained theleft lower extremity was prepped and draped in usual sterile fashion.  Attention was directed to the distal aspect of the left great toe where 2 semielliptical flaps were created just proximal to the nail at the level of the interphalangeal joint.  Full-thickness flaps were created.  The distal phalanx was disarticulated and removed from the surgical field in toto.  The wound was flushed with copious amounts of irrigation.  A deep wound culture was performed.  The digit was sent for pathological examination.  Bleeders were Bovie cauterized as needed.  Closure was then performed with a 3-0 nylon with revision of the skin flap as needed.    Patient tolerated the procedure and anesthesia well.  Was transported from the OR to the PACU with all vital signs stable and vascular status intact. To be discharged per routine protocol.  Will follow up in approximately 1 week in the outpatient clinic.

## 2023-10-04 NOTE — Discharge Instructions (Signed)
 [facility]  POST OPERATIVE INSTRUCTIONS FOR DR. Ether Griffins AND DR. BAKER [facility] PODIATRY DEPARTMENT   Take your medication as prescribed.  Pain medication should be taken only as needed.  Keep the d

## 2023-10-04 NOTE — H&P (Signed)
 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  The various methods of treatment have been discussed with the patient.  No guarantees were given.  After consideration of risks, benefits and other options for treatment, the patient has consented to surgery.  I have reviewed the patients' chart and labs.     Walton history and physical examination was performed in my office.  The patient was reexamined.  There have been no changes to this history and physical examination.  Kendra Walton

## 2023-10-04 NOTE — Anesthesia Preprocedure Evaluation (Addendum)
 Anesthesia Evaluation  Patient identified by MRN, date of birth, ID band Patient awake    Reviewed: Allergy & Precautions, NPO status , Patient's Chart, lab work & pertinent test results  History of Anesthesia Complications Negative for: history of anesthetic complications  Airway Mallampati: I   Neck ROM: Full    Dental  (+) Upper Dentures   Pulmonary former smoker (quit 2014)   Pulmonary exam normal breath sounds clear to auscultation       Cardiovascular hypertension, + CAD and +CHF  Normal cardiovascular exam+ Valvular Problems/Murmurs (s/p TAVR 2020, on Plavix )  Rhythm:Regular Rate:Normal  ECG 10/04/23:  Sinus rhythm with occasional Premature ventricular complexes Minimal voltage criteria for LVH, may be normal variant ( Cornell product ) Cannot rule out Anterior infarct (cited on or before 03-Aug-2021)   Neuro/Psych negative neurological ROS     GI/Hepatic negative GI ROS,,,  Endo/Other  diabetes, Type 2Hypothyroidism  Obesity  Renal/GU Renal disease (stage III CKD)     Musculoskeletal   Abdominal   Peds  Hematology  (+) Blood dyscrasia, anemia Breast CA   Anesthesia Other Findings   Reproductive/Obstetrics                             Anesthesia Physical Anesthesia Plan  ASA: 3  Anesthesia Plan: General   Post-op Pain Management:    Induction: Intravenous  PONV Risk Score and Plan: 3 and Propofol  infusion, TIVA, Treatment may vary due to age or medical condition and Ondansetron   Airway Management Planned: Natural Airway  Additional Equipment:   Intra-op Plan:   Post-operative Plan:   Informed Consent: I have reviewed the patients History and Physical, chart, labs and discussed the procedure including the risks, benefits and alternatives for the proposed anesthesia with the patient or authorized representative who has indicated his/her understanding and acceptance.        Plan Discussed with: CRNA  Anesthesia Plan Comments: (LMA/GETA backup discussed.  Patient consented for risks of anesthesia including but not limited to:  - adverse reactions to medications - damage to eyes, teeth, lips or other oral mucosa - nerve damage due to positioning  - sore throat or hoarseness - damage to heart, brain, nerves, lungs, other parts of body or loss of life  Informed patient about role of CRNA in peri- and intra-operative care.  Patient voiced understanding.)        Anesthesia Quick Evaluation

## 2023-10-04 NOTE — Transfer of Care (Signed)
 Immediate Anesthesia Transfer of Care Note  Patient: Kendra Walton  Procedure(s) Performed: AMPUTATION, TOE (Left: Toe)  Patient Location: PACU  Anesthesia Type:General  Level of Consciousness: awake, alert , and oriented  Airway & Oxygen Therapy: Patient Spontanous Breathing  Post-op Assessment: Report given to RN and Post -op Vital signs reviewed and stable  Post vital signs: Reviewed and stable  Last Vitals:  Vitals Value Taken Time  BP 122/50 10/04/23 1140  Temp 35.9 1140  Pulse 83 10/04/23 1143  Resp 20 10/04/23 1143  SpO2 96 % 10/04/23 1143  Vitals shown include unfiled device data.  Last Pain:  Vitals:   10/04/23 1000  TempSrc: Temporal  PainSc: 0-No pain         Complications: No notable events documented.

## 2023-10-04 NOTE — Anesthesia Postprocedure Evaluation (Signed)
 Anesthesia Post Note  Patient: Kendra Walton  Procedure(s) Performed: AMPUTATION, TOE (Left: Toe)  Patient location during evaluation: PACU Anesthesia Type: General Level of consciousness: awake and alert, oriented and patient cooperative Pain management: pain level controlled Vital Signs Assessment: post-procedure vital signs reviewed and stable Respiratory status: spontaneous breathing, nonlabored ventilation and respiratory function stable Cardiovascular status: blood pressure returned to baseline and stable Postop Assessment: adequate PO intake Anesthetic complications: no   No notable events documented.   Last Vitals:  Vitals:   10/04/23 1230 10/04/23 1302  BP: (!) 136/53 121/73  Pulse: 76 79  Resp: 16 18  Temp: 36.6 C 36.7 C  SpO2: 96% 99%    Last Pain:  Vitals:   10/04/23 1302  TempSrc: Temporal  PainSc: 0-No pain                 Dorothey Gate

## 2023-10-05 ENCOUNTER — Encounter: Payer: Self-pay | Admitting: Podiatry

## 2023-10-08 LAB — SURGICAL PATHOLOGY

## 2023-10-09 DIAGNOSIS — E1142 Type 2 diabetes mellitus with diabetic polyneuropathy: Secondary | ICD-10-CM | POA: Diagnosis not present

## 2023-10-09 DIAGNOSIS — L97524 Non-pressure chronic ulcer of other part of left foot with necrosis of bone: Secondary | ICD-10-CM | POA: Diagnosis not present

## 2023-10-09 LAB — AEROBIC/ANAEROBIC CULTURE W GRAM STAIN (SURGICAL/DEEP WOUND)

## 2023-10-14 ENCOUNTER — Other Ambulatory Visit (INDEPENDENT_AMBULATORY_CARE_PROVIDER_SITE_OTHER): Payer: Self-pay | Admitting: Vascular Surgery

## 2023-10-14 DIAGNOSIS — I739 Peripheral vascular disease, unspecified: Secondary | ICD-10-CM

## 2023-10-15 ENCOUNTER — Ambulatory Visit (INDEPENDENT_AMBULATORY_CARE_PROVIDER_SITE_OTHER)

## 2023-10-15 ENCOUNTER — Encounter (INDEPENDENT_AMBULATORY_CARE_PROVIDER_SITE_OTHER): Payer: Self-pay | Admitting: Nurse Practitioner

## 2023-10-15 ENCOUNTER — Ambulatory Visit (INDEPENDENT_AMBULATORY_CARE_PROVIDER_SITE_OTHER): Admitting: Nurse Practitioner

## 2023-10-15 ENCOUNTER — Encounter (INDEPENDENT_AMBULATORY_CARE_PROVIDER_SITE_OTHER)

## 2023-10-15 VITALS — BP 116/54 | HR 82 | Resp 18 | Wt 246.0 lb

## 2023-10-15 DIAGNOSIS — I7025 Atherosclerosis of native arteries of other extremities with ulceration: Secondary | ICD-10-CM

## 2023-10-15 DIAGNOSIS — I739 Peripheral vascular disease, unspecified: Secondary | ICD-10-CM | POA: Diagnosis not present

## 2023-10-15 DIAGNOSIS — E1149 Type 2 diabetes mellitus with other diabetic neurological complication: Secondary | ICD-10-CM

## 2023-10-15 DIAGNOSIS — I10 Essential (primary) hypertension: Secondary | ICD-10-CM | POA: Diagnosis not present

## 2023-10-15 DIAGNOSIS — Z9889 Other specified postprocedural states: Secondary | ICD-10-CM | POA: Diagnosis not present

## 2023-10-16 ENCOUNTER — Encounter (INDEPENDENT_AMBULATORY_CARE_PROVIDER_SITE_OTHER): Payer: Self-pay | Admitting: Nurse Practitioner

## 2023-10-16 DIAGNOSIS — E1142 Type 2 diabetes mellitus with diabetic polyneuropathy: Secondary | ICD-10-CM | POA: Diagnosis not present

## 2023-10-16 DIAGNOSIS — L97524 Non-pressure chronic ulcer of other part of left foot with necrosis of bone: Secondary | ICD-10-CM | POA: Diagnosis not present

## 2023-10-16 LAB — VAS US ABI WITH/WO TBI
Left ABI: 1.11
Right ABI: 0.96

## 2023-10-16 NOTE — Progress Notes (Signed)
 Subjective:    Patient ID: Kendra Walton, female    DOB: December 16, 1950, 73 y.o.   MRN: [id number] Chief Complaint  Patient presents with   Follow-up    [facility] 3 week with ABI    The patient returns to the office for followup and review status post angiogram with intervention on 09/24/2023.   Procedure: Procedure(s) Performed:             1.  Introduction catheter into left lower extremity 3rd order catheter placement              2.    Contrast injection left lower extremity for distal runoff             3.  Thrombectomy left SFA with the R[place] catheter.               4.   Percutaneous transluminal angioplasty and stent placement left superficial femoral artery              5.  Star close closure right common femoral arteriotomy   The patient notes improvement in the lower extremity symptoms. No interval shortening of the patient's claudication distance or rest pain symptoms. No new ulcers or wounds have occurred since the last visit.  She had a toe amputation and notes that that is healing well, and this coincides with notes from her podiatrist as well.  There have been no significant changes to the patient's overall health care.  No documented history of amaurosis fugax or recent TIA symptoms. There are no recent neurological changes noted. No documented history of DVT, PE or superficial thrombophlebitis. The patient denies recent episodes of angina or shortness of breath.   ABI's Rt=0.96 and Lt=1.11  (previous ABI's Rt=1.15 and Lt=0.86) Duplex US  of the bilateral tibial vessels show multiphasic waveforms with good toe waveforms.    Review of Systems  Skin:  Positive for wound.  All other systems reviewed and are negative.      Objective:   Physical Exam Vitals reviewed.  Cardiovascular:     Rate and Rhythm: Normal rate.     Pulses:          Dorsalis pedis pulses are detected w/ Doppler on the right side and detected w/ Doppler on the left side.        Posterior tibial pulses are detected w/ Doppler on the right side and detected w/ Doppler on the left side.  Pulmonary:     Effort: Pulmonary effort is normal.  Skin:    General: Skin is warm and dry.  Neurological:     Mental Status: She is alert and oriented to person, place, and time.  Psychiatric:        Mood and Affect: Mood normal.        Behavior: Behavior normal.        Thought Content: Thought content normal.        Judgment: Judgment normal.     BP (!) 116/54   Pulse 82   Resp 18   Wt 246 lb (111.6 kg)   BMI 37.40 kg/m   Past Medical History:  Diagnosis Date   Absolute anemia 09/15/2015   Patient declines colonoscopy    Aortic stenosis, moderate 04/05/2017   a.) s/p TAVR 08/18/2018; LEFT axillary approach   Arthritis    Breast cancer, right (HCC) 2010   a.) s/p RIGHT mastectomy; no chemotherpay or XRT   CAD (coronary artery disease) 08/06/2018   a.) R/LHC 08/06/2018:  EF 35%; 20% stenosis oRI; severe AS (MPG 48.2 mmHg; AVA (VTI) = 0.58 cm); mean PA = 36 mmHg, mean PCWP = 27 mmHg, PVR 1.78 WU, CO 5.06 L/min, CI 2.25 L/min/m   CHF (congestive heart failure) (HCC)    a.)  TTE 07/29/2018: EF 35%; moderate global HK; moderate LVH; mild LA dilation; trivial TR/PR, mild AR/MR; moderate AS (MPG 48.2 mmHg); G2DD. b.) R/LHC 08/06/2018; EF 35%; mean PCWP 27 mmHg. c.)  TTE 10/20/2019: EF >55%; mild LVH; trivial MR/TR, mild PR, prostatic AoV. d.)  TTE 08/17/2021: EF >55%; mild LVH; mild LA dilation; trivial MR/TR/PR; prostatic AoV (MPG 12 mmHg); G1DD.   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 b.) CT abd 02/06/2019: measured 4.7 x 3.7 cm. c.) MRI lumbar spine 10/25/2021: interval increase to 7.1 x 7.1 cm.   T2DM (type 2 diabetes mellitus) (HCC)     Social History   Socioeconomic History   Marital status: Married    Spouse name: Josiah Nigh    Number of children: 1   Years of education: Not on file   Highest education level: Associate degree: occupational, Scientist, product/process development, or vocational program  Occupational History   Not on file  Tobacco Use   Smoking status: Former    Current packs/day: 0.00    Average packs/day: 1 pack/day for 40.0 years (40.0 ttl pk-yrs)    Types: Cigarettes    Start date: 06/18/1972    Quit date: 06/18/2012    Years since quitting: 11.3   Smokeless tobacco: Never  Vaping Use   Vaping status: Never Used  Substance and Sexual Activity   Alcohol  use: Not Currently   Drug use: No   Sexual activity: Never  Other Topics Concern   Not on file  Social History Narrative   Not on file   Social Drivers of Health   Financial Resource Strain: Low Risk  (08/09/2023)   Received from [facility]   Overall Financial Resource Strain (CARDIA)    Difficulty of Paying Living Expenses: Not hard at all  Food Insecurity: No Food Insecurity (08/09/2023)   Received from [facility]   Hunger Vital Sign    Worried About Running Out of Food in the Last Year: Never true    Ran Out of Food in the Last Year: Never true  Transportation Needs: No Transportation Needs (08/09/2023)   Received from [facility] - Transportation    In the past 12 months, has lack of transportation kept you from medical appointments or from getting medications?: No    Lack of Transportation (Non-Medical): No  Physical Activity: Inactive (06/27/2023)   Exercise Vital Sign    Days of Exercise per Week: 0 days     Minutes of Exercise per Session: 30 min  Stress: No Stress Concern Present (06/27/2023)   [employer] of Occupational Health - Occupational Stress Questionnaire    Feeling of Stress :  Only a little  Social Connections: Socially Isolated (06/27/2023)   Social Connection and Isolation Panel [NHANES]    Frequency of Communication with Friends and Family: Never    Frequency of Social Gatherings with Friends and Family: Never    Attends Religious Services: Never    Database administrator or Organizations: No    Attends Engineer, structural: Not on file    Marital Status: Married  Catering manager Violence: Not At Risk (10/22/2022)   Humiliation, Afraid, Rape, and Kick questionnaire    Fear of Current or Ex-Partner: No    Emotionally Abused: No    Physically Abused: No    Sexually Abused: No    Past Surgical History:  Procedure Laterality Date   ADRENALECTOMY Right 05/28/2022   AMPUTATION TOE Left 10/04/2023   Procedure: AMPUTATION, TOE;  Surgeon: Anell Baptist, DPM;  Location: [facility] ORS;  Service: Orthopedics/Podiatry;  Laterality: Left;   APPENDECTOMY     CATARACT EXTRACTION W/ INTRAOCULAR LENS  IMPLANT, BILATERAL  2010   CESAREAN SECTION  1983   CHOLECYSTECTOMY  2005   ECTOPIC PREGNANCY SURGERY     LOWER EXTREMITY ANGIOGRAPHY Left 09/24/2023   Procedure: Lower Extremity Angiography;  Surgeon: Jackquelyn Mass, MD;  Location: [facility] INVASIVE CV LAB;  Service: Cardiovascular;  Laterality: Left;   LOWER EXTREMITY INTERVENTION Left 09/24/2023   Procedure: LOWER EXTREMITY INTERVENTION;  Surgeon: Jackquelyn Mass, MD;  Location: [facility] INVASIVE CV LAB;  Service: Cardiovascular;  Laterality: Left;   LUMBAR LAMINECTOMY/DECOMPRESSION MICRODISCECTOMY Right 11/08/2021   Procedure: RIGHT L5-S1 MICRODISCECTOMY;  Surgeon: Jodeen Munch, MD;  Location: [facility] ORS;  Service: Neurosurgery;  Laterality: Right;   MASTECTOMY, PARTIAL Right 2010   REDUCTION MAMMAPLASTY Left 2011   RIGHT/LEFT  HEART CATH AND CORONARY ANGIOGRAPHY Bilateral 08/06/2018   Procedure: RIGHT/LEFT HEART CATH AND CORONARY ANGIOGRAPHY;  Surgeon: Ronney Cola, MD;  Location: [facility] INVASIVE CV LAB;  Service: Cardiovascular;  Laterality: Bilateral;   TOTAL KNEE ARTHROPLASTY Right 01/04/2022   Procedure: TOTAL KNEE ARTHROPLASTY;  Surgeon: Molli Angelucci, MD;  Location: [facility] ORS;  Service: Orthopedics;  Laterality: Right;   TRANSCATHETER AORTIC VALVE REPLACEMENT, TRANSAPICAL  08/18/2018   Procedure: TRANSCATHETER AORTIC VALVE REPLACEMENT; Location: [facility]; Surgeon: Johnetta Nab, MD    Family History  Problem Relation Age of Onset   Hypertension Brother    Diabetes Brother    Breast cancer Neg Hx     Allergies  Allergen Reactions   Shellfish Allergy Hives and Itching       Latest Ref Rng & Units 07/01/2023   11:43 AM 02/25/2023   10:16 AM 05/29/2022   12:00 AM  CBC  WBC 3.4 - 10.8 x10E3/uL 10.2  11.2  14.5      Hemoglobin 11.1 - 15.9 g/dL 9.9  16.1  9.3      Hematocrit 34.0 - 46.6 % 31.8  33.7  29      Platelets 150 - 450 x10E3/uL 269  300  271         This result is from an external source.      CMP     Component Value Date/Time   NA 136 10/04/2023 0955   NA 139 07/01/2023 1143   K 4.8 10/04/2023 0955   CL 104 10/04/2023 0955   CO2 23 10/04/2023 0955   GLUCOSE 118 (H) 10/04/2023 0955   BUN 29 (H) 10/04/2023 0955   BUN 35 (H) 07/01/2023 1143   CREATININE 1.38 (H) 10/04/2023 0955   CALCIUM  8.5 (L)  10/04/2023 0955   PROT 7.2 02/25/2023 1016   ALBUMIN 4.2 02/25/2023 1016   AST 17 02/25/2023 1016   ALT 16 02/25/2023 1016   ALKPHOS 83 02/25/2023 1016   BILITOT 0.3 02/25/2023 1016   EGFR 36 (L) 07/01/2023 1143   GFRNONAA 40 (L) 10/04/2023 0955     VAS US  ABI WITH/WO TBI Result Date: 08/26/2023  LOWER EXTREMITY DOPPLER STUDY Patient Name:  Temperence Sokolski Dominique  Date of Exam:   08/26/2023 Medical Rec #: [id number]                 Accession #:    [id number] Date of Birth: 02-25-1951                  Patient Gender: F Patient Age:   37 years Exam Location:  [facility] Procedure:      VAS US  ABI WITH/WO TBI Referring Phys: --------------------------------------------------------------------------------  Indications: Lt great toe wound x 2 weeks, slow healing  Performing Technologist: Faustine Hoof RVT  Examination Guidelines: A complete evaluation includes at minimum, Doppler waveform signals and systolic blood pressure reading at the level of bilateral brachial, anterior tibial, and posterior tibial arteries, when vessel segments are accessible. Bilateral testing is considered an integral part of a complete examination. Photoelectric Plethysmograph (PPG) waveforms and toe systolic pressure readings are included as required and additional duplex testing as needed. Limited examinations for reoccurring indications may be performed as noted.  ABI Findings: +---------+------------------+-----+---------+--------+ Right    Rt Pressure (mmHg)IndexWaveform Comment  +---------+------------------+-----+---------+--------+ Brachial 143                                      +---------+------------------+-----+---------+--------+ PTA      167               1.15 biphasic          +---------+------------------+-----+---------+--------+ DP       149               1.03 triphasic         +---------+------------------+-----+---------+--------+ Great Toe97                0.67 Normal            +---------+------------------+-----+---------+--------+ +---------+------------------+-----+--------+-------+ Left     Lt Pressure (mmHg)IndexWaveformComment +---------+------------------+-----+--------+-------+ Brachial 145                                    +---------+------------------+-----+--------+-------+ PTA      124               0.86 biphasic        +---------+------------------+-----+--------+-------+ DP       104               0.72 biphasic         +---------+------------------+-----+--------+-------+ Great Toe77                0.53 Abnormal        +---------+------------------+-----+--------+-------+ Patient was scheduled for consult due to findings.  Summary: Right: Resting right ankle-brachial index is within normal range. The right toe-brachial index is normal. Left: Resting left ankle-brachial index indicates mild left lower extremity arterial disease. The left toe-brachial index is abnormal. *See table(s) above for measurements and observations.  Electronically signed by Devon Fogo MD on 08/26/2023 at  4:52:32 PM.    Final        Assessment & Plan:   1. Atherosclerosis of native arteries of the extremities with ulceration (HCC) (Primary) Recommend:  The patient is status post successful angiogram with intervention.  The patient reports that the claudication symptoms and leg pain has improved.   The patient denies lifestyle limiting changes at this point in time.  No further invasive studies, angiography or surgery at this time. The patient should continue walking and begin a more formal exercise program.  The patient should continue antiplatelet therapy and aggressive treatment of the lipid abnormalities  Continued surveillance is indicated as atherosclerosis is likely to progress with time.    Patient should undergo noninvasive studies as ordered. The patient will follow up with me to review the studies.   2. Essential (primary) hypertension Continue antihypertensive medications as already ordered, these medications have been reviewed and there are no changes at this time.  3. DM (diabetes mellitus), type 2 with neurological complications (HCC) Continue hypoglycemic medications as already ordered, these medications have been reviewed and there are no changes at this time.  Hgb A1C to be monitored as already arranged by primary service   Current Outpatient Medications on File Prior to Visit  Medication Sig Dispense  Refill   aspirin  EC 81 MG tablet Take 1 tablet by mouth daily.     atorvastatin  (LIPITOR) 20 MG tablet Take 1 tablet (20 mg total) by mouth daily. (Patient taking differently: Take 20 mg by mouth every morning.) 90 tablet 1   clobetasol cream (TEMOVATE) 0.05 % Apply 1 Application topically as needed.     clopidogrel  (PLAVIX ) 75 MG tablet Take 1 tablet (75 mg total) by mouth daily. 30 tablet 11   Cyanocobalamin  (VITAMIN B-12 PO) Take 1 tablet by mouth daily at 6 (six) AM.     docusate sodium  (COLACE) 100 MG capsule Take 1 capsule (100 mg total) by mouth 2 (two) times daily. (Patient taking differently: Take 100 mg by mouth 2 (two) times daily as needed.) 10 capsule 0   glucose blood (ONE TOUCH ULTRA TEST) test strip Use to test BS twice a day 50 each 12   ketoconazole (NIZORAL) 2 % cream Apply 1 Application topically 2 (two) times daily as needed.     Lancets (ONETOUCH DELICA PLUS LANCET33G) MISC Place 1 each onto the skin 4 (four) times daily as needed. 100 each 3   levothyroxine  (SYNTHROID ) 150 MCG tablet Take 75-150 mcg by mouth See admin instructions. Take 150 mcg daily except Sundays take 75 mcg     lisinopril -hydrochlorothiazide  (ZESTORETIC ) 10-12.5 MG tablet Take 1 tablet by mouth daily. (Patient taking differently: Take 1 tablet by mouth every morning.) 90 tablet 0   loratadine (CLARITIN) 10 MG tablet Take 10 mg by mouth every morning.     metFORMIN  (GLUCOPHAGE -XR) 500 MG 24 hr tablet TAKE 2 TABLETS(1000 MG) BY MOUTH TWICE DAILY (Patient taking differently: 500 mg 2 (two) times daily with a meal. TAKE 2 TABLETS(1000 MG) BY MOUTH TWICE DAILY) 360 tablet 3   oxyCODONE -acetaminophen  (PERCOCET) 5-325 MG tablet Take 1-2 tablets by mouth every 6 (six) hours as needed for severe pain (pain score 7-10). Max 6 tabs per day 30 tablet 0   Polyvinyl Alcohol -Povidone (REFRESH OP) Place 1 drop into both eyes as needed.     pregabalin  (LYRICA ) 50 MG capsule 2 in am; 1 at 1 pm; 2 at HS (Patient taking  differently: Take 50-100 mg by mouth 3 (three)  times daily. 2 in am; 2 at 1 pm; 2 at HS) 450 capsule 1   senna (SENOKOT) 8.6 MG TABS tablet Take 1 tablet (8.6 mg total) by mouth daily as needed for mild constipation. 30 tablet 0   traMADol  (ULTRAM ) 50 MG tablet Take 2 tablets (100 mg total) by mouth every 6 (six) hours as needed. 240 tablet 1   doxycycline (ADOXA) 100 MG tablet Take 100 mg by mouth 2 (two) times daily.     No current facility-administered medications on file prior to visit.    There are no Patient Instructions on file for this visit. No follow-ups on file.   Caydin Yeatts E Braydyn Schultes, NP

## 2023-10-23 DIAGNOSIS — E1142 Type 2 diabetes mellitus with diabetic polyneuropathy: Secondary | ICD-10-CM | POA: Diagnosis not present

## 2023-10-23 DIAGNOSIS — L97524 Non-pressure chronic ulcer of other part of left foot with necrosis of bone: Secondary | ICD-10-CM | POA: Diagnosis not present

## 2023-10-26 ENCOUNTER — Other Ambulatory Visit: Payer: Self-pay | Admitting: Internal Medicine

## 2023-10-26 DIAGNOSIS — I10 Essential (primary) hypertension: Secondary | ICD-10-CM

## 2023-10-26 DIAGNOSIS — E1169 Type 2 diabetes mellitus with other specified complication: Secondary | ICD-10-CM

## 2023-10-28 NOTE — Telephone Encounter (Signed)
 Requested Prescriptions  Refused Prescriptions Disp Refills   atorvastatin  (LIPITOR) 20 MG tablet [Pharmacy Med Name: ATORVASTATIN  20MG  TABLETS] 90 tablet 1    Sig: TAKE 1 TABLET(20 MG) BY MOUTH AT BEDTIME     Cardiovascular:  Antilipid - Statins Failed - 10/28/2023  4:43 PM      Failed - Valid encounter within last 12 months    Recent Outpatient Visits   None     Future Appointments             Tomorrow Sheron Dixons, MD [facility] at [facility], [facility]            Failed - Lipid Panel in normal range within the last 12 months    Cholesterol, Total  Date Value Ref Range Status  02/25/2023 171 100 - 199 mg/dL Final   LDL Chol Calc (NIH)  Date Value Ref Range Status  02/25/2023 91 0 - 99 mg/dL Final   HDL  Date Value Ref Range Status  02/25/2023 45 >39 mg/dL Final   Triglycerides  Date Value Ref Range Status  02/25/2023 209 (H) 0 - 149 mg/dL Final         Passed - Patient is not pregnant       lisinopril -hydrochlorothiazide  (ZESTORETIC ) 10-12.5 MG tablet [Pharmacy Med Name: LISINOPRIL -HCTZ 10/12.5MG  TABLETS] 90 tablet 0    Sig: TAKE 1 TABLET BY MOUTH DAILY     Cardiovascular:  ACEI + Diuretic Combos Failed - 10/28/2023  4:43 PM      Failed - Cr in normal range and within 180 days    Creatinine, Ser  Date Value Ref Range Status  10/04/2023 1.38 (H) 0.44 - 1.00 mg/dL Final         Failed - Valid encounter within last 6 months    Recent Outpatient Visits   None     Future Appointments             Tomorrow Sheron Dixons, MD [facility] at [facility], [facility]            Passed - Na in normal range and within 180 days    Sodium  Date Value Ref Range Status  10/04/2023 136 135 - 145 mmol/L Final  07/01/2023 139 134 - 144 mmol/L Final         Passed - K in normal range and within 180 days    Potassium  Date Value Ref Range Status  10/04/2023 4.8 3.5 - 5.1 mmol/L Final          Passed - eGFR is 30 or above and within 180 days    GFR calc Af Amer  Date Value Ref Range Status  05/09/2020 65 >59 mL/min/1.73 Final    Comment:    **In accordance with recommendations from the NKF-ASN Task force,**   [facility] is in the process of updating its eGFR calculation to the   2021 CKD-EPI creatinine equation that estimates kidney function   without a race variable.    GFR, Estimated  Date Value Ref Range Status  10/04/2023 40 (L) >60 mL/min Final    Comment:    (NOTE) Calculated using the CKD-EPI Creatinine Equation (2021)    eGFR  Date Value Ref Range Status  07/01/2023 36 (L) >59 mL/min/1.73 Final         Passed - Patient is not pregnant      Passed - Last BP in normal range  BP Readings from Last 1 Encounters:  10/15/23 (!) 116/54

## 2023-10-29 ENCOUNTER — Ambulatory Visit (INDEPENDENT_AMBULATORY_CARE_PROVIDER_SITE_OTHER): Payer: Self-pay | Admitting: Internal Medicine

## 2023-10-29 ENCOUNTER — Encounter: Payer: Self-pay | Admitting: Internal Medicine

## 2023-10-29 VITALS — BP 116/66 | HR 111 | Ht 68.0 in | Wt 238.2 lb

## 2023-10-29 DIAGNOSIS — N1832 Chronic kidney disease, stage 3b: Secondary | ICD-10-CM

## 2023-10-29 DIAGNOSIS — E118 Type 2 diabetes mellitus with unspecified complications: Secondary | ICD-10-CM | POA: Diagnosis not present

## 2023-10-29 DIAGNOSIS — Z7984 Long term (current) use of oral hypoglycemic drugs: Secondary | ICD-10-CM | POA: Diagnosis not present

## 2023-10-29 DIAGNOSIS — E1142 Type 2 diabetes mellitus with diabetic polyneuropathy: Secondary | ICD-10-CM | POA: Diagnosis not present

## 2023-10-29 DIAGNOSIS — I10 Essential (primary) hypertension: Secondary | ICD-10-CM | POA: Diagnosis not present

## 2023-10-29 LAB — POCT GLYCOSYLATED HEMOGLOBIN (HGB A1C): Hemoglobin A1C: 6 % — AB (ref 4.0–5.6)

## 2023-10-29 MED ORDER — TRAMADOL HCL 50 MG PO TABS: ORAL_TABLET | ORAL | 0 refills | Status: DC

## 2023-10-29 NOTE — Progress Notes (Signed)
 Date:  10/29/2023   Name:  Kendra Walton   DOB:  1950-08-08   MRN:  [id number]   Chief Complaint: Diabetes and Hypertension  Diabetes She presents for her follow-up diabetic visit. She has type 2 diabetes mellitus. Pertinent negatives for hypoglycemia include no headaches, nervousness/anxiousness or tremors. Pertinent negatives for diabetes include no chest pain, no fatigue, no polydipsia and no polyuria. Current diabetic treatment includes oral agent (monotherapy) (MTF). She is compliant with treatment all of the time. An ACE inhibitor/angiotensin II receptor blocker is being taken.  Hypertension This is a chronic problem. The problem is controlled. Pertinent negatives include no chest pain, headaches, palpitations or shortness of breath. Past treatments include ACE inhibitors and diuretics.    Review of Systems  Constitutional:  Negative for appetite change, fatigue, fever and unexpected weight change.  HENT:  Negative for tinnitus and trouble swallowing.   Eyes:  Negative for visual disturbance.  Respiratory:  Negative for cough, chest tightness and shortness of breath.   Cardiovascular:  Negative for chest pain, palpitations and leg swelling.  Gastrointestinal:  Negative for abdominal pain.  Endocrine: Negative for polydipsia and polyuria.  Genitourinary:  Negative for dysuria and hematuria.  Musculoskeletal:  Positive for myalgias. Negative for arthralgias, gait problem and joint swelling.  Skin:  Positive for wound (toe surgical site healing well.).  Neurological:  Negative for tremors, numbness and headaches.  Psychiatric/Behavioral:  Negative for dysphoric mood and sleep disturbance. The patient is not nervous/anxious.      Lab Results  Component Value Date   NA 136 10/04/2023   K 4.8 10/04/2023   CO2 23 10/04/2023   GLUCOSE 118 (H) 10/04/2023   BUN 29 (H) 10/04/2023   CREATININE 1.38 (H) 10/04/2023   CALCIUM  8.5 (L) 10/04/2023   EGFR 36 (L) 07/01/2023    GFRNONAA 40 (L) 10/04/2023   Lab Results  Component Value Date   CHOL 171 02/25/2023   HDL 45 02/25/2023   LDLCALC 91 02/25/2023   TRIG 209 (H) 02/25/2023   CHOLHDL 3.8 02/25/2023   Lab Results  Component Value Date   TSH 3.08 02/05/2022   Lab Results  Component Value Date   HGBA1C 6.0 (A) 10/29/2023   Lab Results  Component Value Date   WBC 10.2 07/01/2023   HGB 9.9 (L) 07/01/2023   HCT 31.8 (L) 07/01/2023   MCV 86 07/01/2023   PLT 269 07/01/2023   Lab Results  Component Value Date   ALT 16 02/25/2023   AST 17 02/25/2023   ALKPHOS 83 02/25/2023   BILITOT 0.3 02/25/2023   No results found for: "25OHVITD2", "25OHVITD3", "VD25OH"   Patient Active Problem List   Diagnosis Date Noted   Atherosclerosis of native arteries of the extremities with ulceration (HCC) 09/11/2023   Obesity, morbid (HCC) 07/01/2023   Tobacco use disorder, moderate, in sustained remission 06/22/2022   Chronic kidney disease, stage 3b (HCC) 02/20/2022   S/P TKR (total knee replacement) using cement, right 01/04/2022   Type II diabetes mellitus with complication (HCC) 06/20/2021   Aortic atherosclerosis (HCC) 01/03/2020   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   Hearing decreased 12/13/2015   Hyperlipidemia associated with type 2 diabetes mellitus (HCC) 09/14/2015   History of breast cancer in female 04/07/2015   Acquired hypothyroidism 10/04/2014   Diabetic peripheral neuropathy (HCC) 10/04/2014   Essential (primary) hypertension 10/04/2014   DM (diabetes mellitus), type 2 with neurological complications (HCC) 10/04/2014    Allergies  Allergen Reactions  Shellfish Allergy Hives and Itching    Past Surgical History:  Procedure Laterality Date   ADRENALECTOMY Right 05/28/2022   AMPUTATION TOE Left 10/04/2023   Procedure: AMPUTATION, TOE;  Surgeon: Anell Baptist, DPM;  Location: [facility];  Service: Orthopedics/Podiatry;  Laterality: Left;   APPENDECTOMY      CATARACT EXTRACTION W/ INTRAOCULAR LENS  IMPLANT, BILATERAL  2010   CESAREAN SECTION  1983   CHOLECYSTECTOMY  2005   ECTOPIC PREGNANCY SURGERY     LOWER EXTREMITY ANGIOGRAPHY Left 09/24/2023   Procedure: Lower Extremity Angiography;  Surgeon: Jackquelyn Mass, MD;  Location: [facility];  Service: Cardiovascular;  Laterality: Left;   LOWER EXTREMITY INTERVENTION Left 09/24/2023   Procedure: LOWER EXTREMITY INTERVENTION;  Surgeon: Jackquelyn Mass, MD;  Location: [facility];  Service: Cardiovascular;  Laterality: Left;   LUMBAR LAMINECTOMY/DECOMPRESSION MICRODISCECTOMY Right 11/08/2021   Procedure: RIGHT L5-S1 MICRODISCECTOMY;  Surgeon: Jodeen Munch, MD;  Location: [facility];  Service: Neurosurgery;  Laterality: Right;   MASTECTOMY, PARTIAL Right 2010   REDUCTION MAMMAPLASTY Left 2011   RIGHT/LEFT HEART CATH AND CORONARY ANGIOGRAPHY Bilateral 08/06/2018   Procedure: RIGHT/LEFT HEART CATH AND CORONARY ANGIOGRAPHY;  Surgeon: Ronney Cola, MD;  Location: [facility];  Service: Cardiovascular;  Laterality: Bilateral;   TOTAL KNEE ARTHROPLASTY Right 01/04/2022   Procedure: TOTAL KNEE ARTHROPLASTY;  Surgeon: Molli Angelucci, MD;  Location: [facility];  Service: Orthopedics;  Laterality: Right;   TRANSCATHETER AORTIC VALVE REPLACEMENT, TRANSAPICAL  08/18/2018   Procedure: TRANSCATHETER AORTIC VALVE REPLACEMENT; Location: [facility]; Surgeon: Johnetta Nab, MD    Social History   Tobacco Use   Smoking status: Former    Current packs/day: 0.00    Average packs/day: 1 pack/day for 40.0 years (40.0 ttl pk-yrs)    Types: Cigarettes    Start date: 06/18/1972    Quit date: 06/18/2012    Years since quitting: 11.3   Smokeless tobacco: Never  Vaping Use   Vaping status: Never Used  Substance Use Topics   Alcohol  use: Not Currently   Drug use: No     Medication list has been reviewed and updated.  Current Meds  Medication Sig   aspirin  EC 81 MG tablet Take 1 tablet by  mouth daily.   atorvastatin  (LIPITOR) 20 MG tablet Take 1 tablet (20 mg total) by mouth daily. (Patient taking differently: Take 20 mg by mouth every morning.)   clobetasol cream (TEMOVATE) 0.05 % Apply 1 Application topically as needed.   clopidogrel  (PLAVIX ) 75 MG tablet Take 1 tablet (75 mg total) by mouth daily.   Cyanocobalamin  (VITAMIN B-12 PO) Take 1 tablet by mouth daily at 6 (six) AM.   docusate sodium  (COLACE) 100 MG capsule Take 1 capsule (100 mg total) by mouth 2 (two) times daily. (Patient taking differently: Take 100 mg by mouth 2 (two) times daily as needed.)   glucose blood (ONE TOUCH ULTRA TEST) test strip Use to test BS twice a day   ketoconazole (NIZORAL) 2 % cream Apply 1 Application topically 2 (two) times daily as needed.   Lancets (ONETOUCH DELICA PLUS LANCET33G) MISC Place 1 each onto the skin 4 (four) times daily as needed.   levothyroxine  (SYNTHROID ) 150 MCG tablet Take 75-150 mcg by mouth See admin instructions. Take 150 mcg daily except Sundays take 75 mcg   lisinopril -hydrochlorothiazide  (ZESTORETIC ) 10-12.5 MG tablet Take 1 tablet by mouth daily. (Patient taking differently: Take 1 tablet by mouth every morning.)   loratadine (CLARITIN) 10 MG  tablet Take 10 mg by mouth every morning.   metFORMIN  (GLUCOPHAGE -XR) 500 MG 24 hr tablet TAKE 2 TABLETS(1000 MG) BY MOUTH TWICE DAILY (Patient taking differently: 500 mg 2 (two) times daily with a meal. TAKE 2 TABLETS(1000 MG) BY MOUTH TWICE DAILY)   Polyvinyl Alcohol -Povidone (REFRESH OP) Place 1 drop into both eyes as needed.   pregabalin  (LYRICA ) 50 MG capsule 2 in am; 1 at 1 pm; 2 at HS (Patient taking differently: Take 50-100 mg by mouth 3 (three) times daily. 2 in am; 2 at 1 pm; 2 at HS)   senna (SENOKOT) 8.6 MG TABS tablet Take 1 tablet (8.6 mg total) by mouth daily as needed for mild constipation.   [DISCONTINUED] traMADol  (ULTRAM ) 50 MG tablet Take 2 tablets (100 mg total) by mouth every 6 (six) hours as needed. (Patient  taking differently: Take 100 mg by mouth every 8 (eight) hours as needed. Taking 2 in the AM, 2 at midday and 1 at bedtime)       10/29/2023   11:11 AM 07/01/2023   11:17 AM 02/25/2023    9:25 AM 10/22/2022   10:47 AM  GAD 7 : Generalized Anxiety Score  Nervous, Anxious, on Edge 3 0 1 0  Control/stop worrying 0 0 0 0  Worry too much - different things 0 0 1 0  Trouble relaxing 0 0 0 0  Restless 0 0 0 0  Easily annoyed or irritable 1 0 0 0  Afraid - awful might happen 0 0 1 0  Total GAD 7 Score 4 0 3 0  Anxiety Difficulty Not difficult at all Not difficult at all Not difficult at all Not difficult at all       10/29/2023   11:11 AM 07/01/2023   11:16 AM 02/25/2023    9:24 AM  Depression screen PHQ 2/9  Decreased Interest 0 1 0  Down, Depressed, Hopeless 0 1 0  PHQ - 2 Score 0 2 0  Altered sleeping 0 0 0  Tired, decreased energy 0 0 3  Change in appetite 1 0 2  Feeling bad or failure about yourself  0 0 0  Trouble concentrating 0 0 0  Moving slowly or fidgety/restless 0 0 0  Suicidal thoughts 0 0 0  PHQ-9 Score 1 2 5   Difficult doing work/chores Not difficult at all Not difficult at all Not difficult at all    BP Readings from Last 3 Encounters:  10/29/23 116/66  10/15/23 (!) 116/54  10/04/23 121/73    Physical Exam Vitals and nursing note reviewed.  Constitutional:      General: She is not in acute distress.    Appearance: Normal appearance. She is well-developed.  HENT:     Head: Normocephalic and atraumatic.  Cardiovascular:     Rate and Rhythm: Normal rate and regular rhythm.     Heart sounds: No murmur heard. Pulmonary:     Effort: Pulmonary effort is normal. No respiratory distress.     Breath sounds: No wheezing or rhonchi.  Musculoskeletal:     Cervical back: Normal range of motion.     Right lower leg: No edema.     Left lower leg: No edema.  Lymphadenopathy:     Cervical: No cervical adenopathy.  Skin:    General: Skin is warm and dry.     Capillary  Refill: Capillary refill takes less than 2 seconds.     Findings: No rash.  Neurological:     Mental Status: She  is alert and oriented to person, place, and time.     Sensory: Sensory deficit present.  Psychiatric:        Mood and Affect: Mood normal.        Behavior: Behavior normal.     Wt Readings from Last 3 Encounters:  10/29/23 238 lb 4 oz (108.1 kg)  10/15/23 246 lb (111.6 kg)  10/04/23 243 lb 12.8 oz (110.6 kg)    BP 116/66   Pulse (!) 111   Ht 5\' 8"  (1.727 m)   Wt 238 lb 4 oz (108.1 kg)   SpO2 94%   BMI 36.23 kg/m   Assessment and Plan:  Problem List Items Addressed This Visit       Unprioritized   Diabetic peripheral neuropathy (HCC) (Chronic)   She is having less pain since revascularization. Current on Lyrica  50 mg #5 per day and Tramadol  50 mg #5 per day (decreased from 8 per day) Will refill Tramadol  with the correct dosing      Relevant Medications   traMADol  (ULTRAM ) 50 MG tablet   Essential (primary) hypertension - Primary (Chronic)   Blood pressure is well controlled.  Current medications are lisinopril  and hydrochlorothiazide . Will continue same regimen along with efforts to limit dietary sodium.       Type II diabetes mellitus with complication (HCC) (Chronic)   Blood sugars have been stable.  No recent hypoglycemic events requiring assistance. Currently medications are MTF. Lab Results  Component Value Date   HGBA1C 6.0 (A) 10/29/2023   Last visit no changes were made. A1C down from 6.2.  will continue same regimen.       Relevant Orders   POCT glycosylated hemoglobin (Hb A1C) (Completed)   Chronic kidney disease, stage 3b (HCC)   Seen by Nephrology Labs stable; no medication changes were made other than to suggest daily iron for anemia which she took for 2 months. CBC improved - recommend stopping iron.      Other Visit Diagnoses       Long term current use of oral hypoglycemic drug           Return in about 4 months  (around 02/29/2024) for CPX.    Sheron Dixons, MD [facility] [place]

## 2023-10-29 NOTE — Assessment & Plan Note (Addendum)
 Blood sugars have been stable.  No recent hypoglycemic events requiring assistance. Currently medications are MTF. Lab Results  Component Value Date   HGBA1C 6.0 (A) 10/29/2023   Last visit no changes were made. A1C down from 6.2.  will continue same regimen.

## 2023-10-29 NOTE — Assessment & Plan Note (Signed)
 Blood pressure is well controlled.  Current medications are lisinopril  and hydrochlorothiazide . Will continue same regimen along with efforts to limit dietary sodium.

## 2023-10-29 NOTE — Assessment & Plan Note (Signed)
 She is having less pain since revascularization. Current on Lyrica  50 mg #5 per day and Tramadol  50 mg #5 per day (decreased from 8 per day) Will refill Tramadol  with the correct dosing

## 2023-10-29 NOTE — Assessment & Plan Note (Addendum)
 Seen by Nephrology Labs stable; no medication changes were made other than to suggest daily iron for anemia which she took for 2 months. CBC improved - recommend stopping iron.

## 2023-10-31 ENCOUNTER — Other Ambulatory Visit (HOSPITAL_COMMUNITY): Payer: Self-pay

## 2023-10-31 ENCOUNTER — Telehealth: Payer: Self-pay | Admitting: Pharmacy Technician

## 2023-10-31 NOTE — Telephone Encounter (Signed)
 Pharmacy Patient Advocate Encounter   Received notification from Onbase that prior authorization for Pregabalin  50MG  capsules is required/requested.   Insurance verification completed.   The patient is insured through [facility] .   Per test claim: PA required; PA submitted to above mentioned insurance via CoverMyMeds Key/confirmation #/EOC [id number] Status is pending

## 2023-10-31 NOTE — Telephone Encounter (Signed)
 Pharmacy Patient Advocate Encounter  Received notification from [employer] that Prior Authorization for Pregabalin  50MG  capsules has been APPROVED from 10/31/23 to 06/17/24   PA #/Case ID/Reference #: [id number]

## 2023-10-31 NOTE — Telephone Encounter (Signed)
 Thanks for the update

## 2023-11-05 ENCOUNTER — Encounter (INDEPENDENT_AMBULATORY_CARE_PROVIDER_SITE_OTHER): Payer: Self-pay

## 2023-11-25 ENCOUNTER — Other Ambulatory Visit: Payer: Self-pay | Admitting: Internal Medicine

## 2023-11-25 ENCOUNTER — Encounter: Payer: Self-pay | Admitting: Internal Medicine

## 2023-11-25 DIAGNOSIS — E1142 Type 2 diabetes mellitus with diabetic polyneuropathy: Secondary | ICD-10-CM

## 2023-11-25 MED ORDER — TRAMADOL HCL 50 MG PO TABS: ORAL_TABLET | ORAL | 3 refills | Status: DC

## 2023-11-25 NOTE — Telephone Encounter (Signed)
 Please review.  KP

## 2023-11-25 NOTE — Progress Notes (Unsigned)
 Date:  11/25/2023   Name:  Kendra Walton   DOB:  07/06/1950   MRN:  [id number]   Chief Complaint: No chief complaint on file.  HPI  Review of Systems   Lab Results  Component Value Date   NA 136 10/04/2023   K 4.8 10/04/2023   CO2 23 10/04/2023   GLUCOSE 118 (H) 10/04/2023   BUN 29 (H) 10/04/2023   CREATININE 1.38 (H) 10/04/2023   CALCIUM  8.5 (L) 10/04/2023   EGFR 36 (L) 07/01/2023   GFRNONAA 40 (L) 10/04/2023   Lab Results  Component Value Date   CHOL 171 02/25/2023   HDL 45 02/25/2023   LDLCALC 91 02/25/2023   TRIG 209 (H) 02/25/2023   CHOLHDL 3.8 02/25/2023   Lab Results  Component Value Date   TSH 3.08 02/05/2022   Lab Results  Component Value Date   HGBA1C 6.0 (A) 10/29/2023   Lab Results  Component Value Date   WBC 10.2 07/01/2023   HGB 9.9 (L) 07/01/2023   HCT 31.8 (L) 07/01/2023   MCV 86 07/01/2023   PLT 269 07/01/2023   Lab Results  Component Value Date   ALT 16 02/25/2023   AST 17 02/25/2023   ALKPHOS 83 02/25/2023   BILITOT 0.3 02/25/2023   No results found for: "25OHVITD2", "25OHVITD3", "VD25OH"   Patient Active Problem List   Diagnosis Date Noted   Atherosclerosis of native arteries of the extremities with ulceration (HCC) 09/11/2023   Obesity, morbid (HCC) 07/01/2023   Tobacco use disorder, moderate, in sustained remission 06/22/2022   Chronic kidney disease, stage 3b (HCC) 02/20/2022   S/P TKR (total knee replacement) using cement, right 01/04/2022   Type II diabetes mellitus with complication (HCC) 06/20/2021   Aortic atherosclerosis (HCC) 01/03/2020   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   Hearing decreased 12/13/2015   Hyperlipidemia associated with type 2 diabetes mellitus (HCC) 09/14/2015   History of breast cancer in female 04/07/2015   Acquired hypothyroidism 10/04/2014   Diabetic peripheral neuropathy (HCC) 10/04/2014   Essential (primary) hypertension 10/04/2014   DM (diabetes  mellitus), type 2 with neurological complications (HCC) 10/04/2014    Allergies  Allergen Reactions   Shellfish Allergy Hives and Itching    Past Surgical History:  Procedure Laterality Date   ADRENALECTOMY Right 05/28/2022   AMPUTATION TOE Left 10/04/2023   Procedure: AMPUTATION, TOE;  Surgeon: Anell Baptist, DPM;  Location: [facility];  Service: Orthopedics/Podiatry;  Laterality: Left;   APPENDECTOMY     CATARACT EXTRACTION W/ INTRAOCULAR LENS  IMPLANT, BILATERAL  2010   CESAREAN SECTION  1983   CHOLECYSTECTOMY  2005   ECTOPIC PREGNANCY SURGERY     LOWER EXTREMITY ANGIOGRAPHY Left 09/24/2023   Procedure: Lower Extremity Angiography;  Surgeon: Jackquelyn Mass, MD;  Location: [facility];  Service: Cardiovascular;  Laterality: Left;   LOWER EXTREMITY INTERVENTION Left 09/24/2023   Procedure: LOWER EXTREMITY INTERVENTION;  Surgeon: Jackquelyn Mass, MD;  Location: [facility];  Service: Cardiovascular;  Laterality: Left;   LUMBAR LAMINECTOMY/DECOMPRESSION MICRODISCECTOMY Right 11/08/2021   Procedure: RIGHT L5-S1 MICRODISCECTOMY;  Surgeon: Jodeen Munch, MD;  Location: [facility];  Service: Neurosurgery;  Laterality: Right;   MASTECTOMY, PARTIAL Right 2010   REDUCTION MAMMAPLASTY Left 2011   RIGHT/LEFT HEART CATH AND CORONARY ANGIOGRAPHY Bilateral 08/06/2018   Procedure: RIGHT/LEFT HEART CATH AND CORONARY ANGIOGRAPHY;  Surgeon: Ronney Cola, MD;  Location: [facility];  Service: Cardiovascular;  Laterality: Bilateral;  TOTAL KNEE ARTHROPLASTY Right 01/04/2022   Procedure: TOTAL KNEE ARTHROPLASTY;  Surgeon: Molli Angelucci, MD;  Location: [facility];  Service: Orthopedics;  Laterality: Right;   TRANSCATHETER AORTIC VALVE REPLACEMENT, TRANSAPICAL  08/18/2018   Procedure: TRANSCATHETER AORTIC VALVE REPLACEMENT; Location: [facility]; Surgeon: Johnetta Nab, MD    Social History   Tobacco Use   Smoking status: Former    Current packs/day: 0.00    Average  packs/day: 1 pack/day for 40.0 years (40.0 ttl pk-yrs)    Types: Cigarettes    Start date: 06/18/1972    Quit date: 06/18/2012    Years since quitting: 11.4   Smokeless tobacco: Never  Vaping Use   Vaping status: Never Used  Substance Use Topics   Alcohol  use: Not Currently   Drug use: No     Medication list has been reviewed and updated.  No outpatient medications have been marked as taking for the 11/25/23 encounter (Orders Only) with Sheron Dixons, MD.       10/29/2023   11:11 AM 07/01/2023   11:17 AM 02/25/2023    9:25 AM 10/22/2022   10:47 AM  GAD 7 : Generalized Anxiety Score  Nervous, Anxious, on Edge 3 0 1 0  Control/stop worrying 0 0 0 0  Worry too much - different things 0 0 1 0  Trouble relaxing 0 0 0 0  Restless 0 0 0 0  Easily annoyed or irritable 1 0 0 0  Afraid - awful might happen 0 0 1 0  Total GAD 7 Score 4 0 3 0  Anxiety Difficulty Not difficult at all Not difficult at all Not difficult at all Not difficult at all       10/29/2023   11:11 AM 07/01/2023   11:16 AM 02/25/2023    9:24 AM  Depression screen PHQ 2/9  Decreased Interest 0 1 0  Down, Depressed, Hopeless 0 1 0  PHQ - 2 Score 0 2 0  Altered sleeping 0 0 0  Tired, decreased energy 0 0 3  Change in appetite 1 0 2  Feeling bad or failure about yourself  0 0 0  Trouble concentrating 0 0 0  Moving slowly or fidgety/restless 0 0 0  Suicidal thoughts 0 0 0  PHQ-9 Score 1 2 5   Difficult doing work/chores Not difficult at all Not difficult at all Not difficult at all    BP Readings from Last 3 Encounters:  10/29/23 116/66  10/15/23 (!) 116/54  10/04/23 121/73    Physical Exam  Wt Readings from Last 3 Encounters:  10/29/23 238 lb 4 oz (108.1 kg)  10/15/23 246 lb (111.6 kg)  10/04/23 243 lb 12.8 oz (110.6 kg)    There were no vitals taken for this visit.  Assessment and Plan:  Problem List Items Addressed This Visit       Unprioritized   Diabetic peripheral neuropathy (HCC) (Chronic)    Relevant Medications   traMADol  (ULTRAM ) 50 MG tablet    No follow-ups on file.    Sheron Dixons, MD [facility] [place]

## 2023-12-07 ENCOUNTER — Encounter: Payer: Self-pay | Admitting: Internal Medicine

## 2023-12-07 DIAGNOSIS — I10 Essential (primary) hypertension: Secondary | ICD-10-CM

## 2023-12-09 MED ORDER — LISINOPRIL-HYDROCHLOROTHIAZIDE 10-12.5 MG PO TABS: 1.0000 | ORAL_TABLET | Freq: Every day | ORAL | 2 refills | Status: DC

## 2023-12-26 ENCOUNTER — Other Ambulatory Visit: Payer: Self-pay

## 2023-12-26 DIAGNOSIS — E1149 Type 2 diabetes mellitus with other diabetic neurological complication: Secondary | ICD-10-CM

## 2023-12-26 MED ORDER — BLOOD GLUCOSE MONITORING SUPPL DEVI: 1.0000 | Freq: Three times a day (TID) | 0 refills | Status: AC

## 2023-12-26 MED ORDER — LANCETS MISC. MISC
1.0000 | Freq: Three times a day (TID) | 0 refills | Status: AC
Start: 2023-12-26 — End: 2024-01-25

## 2023-12-26 MED ORDER — LANCET DEVICE MISC: 1.0000 | Freq: Three times a day (TID) | 0 refills | Status: AC

## 2023-12-26 MED ORDER — BLOOD GLUCOSE TEST VI STRP: 1.0000 | ORAL_STRIP | Freq: Three times a day (TID) | 0 refills | Status: DC

## 2024-01-08 ENCOUNTER — Ambulatory Visit (INDEPENDENT_AMBULATORY_CARE_PROVIDER_SITE_OTHER): Admitting: Internal Medicine

## 2024-01-08 VITALS — BP 124/78 | HR 100 | Temp 98.5°F | Ht 68.0 in

## 2024-01-08 DIAGNOSIS — R051 Acute cough: Secondary | ICD-10-CM

## 2024-01-08 DIAGNOSIS — J40 Bronchitis, not specified as acute or chronic: Secondary | ICD-10-CM

## 2024-01-08 LAB — POC COVID19 BINAXNOW: SARS Coronavirus 2 Ag: NEGATIVE

## 2024-01-08 MED ORDER — PREDNISONE 10 MG PO TABS: ORAL_TABLET | ORAL | 0 refills | Status: AC

## 2024-01-08 MED ORDER — AZITHROMYCIN 250 MG PO TABS: ORAL_TABLET | ORAL | 0 refills | Status: AC

## 2024-01-08 MED ORDER — HYDROCOD POLI-CHLORPHE POLI ER 10-8 MG/5ML PO SUER: 5.0000 mL | Freq: Two times a day (BID) | ORAL | 0 refills | Status: DC | PRN

## 2024-01-08 MED ORDER — ALBUTEROL SULFATE HFA 108 (90 BASE) MCG/ACT IN AERS: 2.0000 | INHALATION_SPRAY | Freq: Four times a day (QID) | RESPIRATORY_TRACT | 0 refills | Status: DC | PRN

## 2024-01-08 NOTE — Progress Notes (Signed)
 Date:  01/08/2024   Name:  Kendra Walton   DOB:  June 21, 1950   MRN:  [id number]   Chief Complaint: Cough (X 2 days. Cough- non productive. Using VICKS, and Nyquil Day. No fever. SOB. )  Cough This is a new problem. The current episode started yesterday. The problem has been unchanged. The problem occurs every few minutes. The cough is Non-productive.    Review of Systems  Respiratory:  Positive for cough.      Lab Results  Component Value Date   NA 136 10/04/2023   K 4.8 10/04/2023   CO2 23 10/04/2023   GLUCOSE 118 (H) 10/04/2023   BUN 29 (H) 10/04/2023   CREATININE 1.38 (H) 10/04/2023   CALCIUM  8.5 (L) 10/04/2023   EGFR 36 (L) 07/01/2023   GFRNONAA 40 (L) 10/04/2023   Lab Results  Component Value Date   CHOL 171 02/25/2023   HDL 45 02/25/2023   LDLCALC 91 02/25/2023   TRIG 209 (H) 02/25/2023   CHOLHDL 3.8 02/25/2023   Lab Results  Component Value Date   TSH 3.08 02/05/2022   Lab Results  Component Value Date   HGBA1C 6.0 (A) 10/29/2023   Lab Results  Component Value Date   WBC 10.2 07/01/2023   HGB 9.9 (L) 07/01/2023   HCT 31.8 (L) 07/01/2023   MCV 86 07/01/2023   PLT 269 07/01/2023   Lab Results  Component Value Date   ALT 16 02/25/2023   AST 17 02/25/2023   ALKPHOS 83 02/25/2023   BILITOT 0.3 02/25/2023   No results found for: MARIEN BOLLS, VD25OH   Patient Active Problem List   Diagnosis Date Noted   Atherosclerosis of native arteries of the extremities with ulceration (HCC) 09/11/2023   Obesity, morbid (HCC) 07/01/2023   Tobacco use disorder, moderate, in sustained remission 06/22/2022   Chronic kidney disease, stage 3b (HCC) 02/20/2022   S/P TKR (total knee replacement) using cement, right 01/04/2022   Type II diabetes mellitus with complication (HCC) 06/20/2021   Aortic atherosclerosis (HCC) 01/03/2020   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   Hearing decreased 12/13/2015   Hyperlipidemia  associated with type 2 diabetes mellitus (HCC) 09/14/2015   History of breast cancer in female 04/07/2015   Acquired hypothyroidism 10/04/2014   Diabetic peripheral neuropathy (HCC) 10/04/2014   Essential (primary) hypertension 10/04/2014   DM (diabetes mellitus), type 2 with neurological complications (HCC) 10/04/2014    Allergies  Allergen Reactions   Shellfish Allergy Hives and Itching    Past Surgical History:  Procedure Laterality Date   ADRENALECTOMY Right 05/28/2022   AMPUTATION TOE Left 10/04/2023   Procedure: AMPUTATION, TOE;  Surgeon: Ashley Soulier, DPM;  Location: [facility];  Service: Orthopedics/Podiatry;  Laterality: Left;   APPENDECTOMY     CARDIAC VALVE REPLACEMENT  2020   Aortic Valve   CATARACT EXTRACTION W/ INTRAOCULAR LENS  IMPLANT, BILATERAL  2010   CESAREAN SECTION  1983   CHOLECYSTECTOMY  2005   ECTOPIC PREGNANCY SURGERY     LOWER EXTREMITY ANGIOGRAPHY Left 09/24/2023   Procedure: Lower Extremity Angiography;  Surgeon: Jama Cordella MATSU, MD;  Location: [facility];  Service: Cardiovascular;  Laterality: Left;   LOWER EXTREMITY INTERVENTION Left 09/24/2023   Procedure: LOWER EXTREMITY INTERVENTION;  Surgeon: Jama Cordella MATSU, MD;  Location: [facility];  Service: Cardiovascular;  Laterality: Left;   LUMBAR LAMINECTOMY/DECOMPRESSION MICRODISCECTOMY Right 11/08/2021   Procedure: RIGHT L5-S1 MICRODISCECTOMY;  Surgeon: Clois Fret, MD;  Location:  [facility];  Service: Neurosurgery;  Laterality: Right;   MASTECTOMY, PARTIAL Right 2010   REDUCTION MAMMAPLASTY Left 2011   RIGHT/LEFT HEART CATH AND CORONARY ANGIOGRAPHY Bilateral 08/06/2018   Procedure: RIGHT/LEFT HEART CATH AND CORONARY ANGIOGRAPHY;  Surgeon: Bosie Vinie LABOR, MD;  Location: [facility];  Service: Cardiovascular;  Laterality: Bilateral;   SPINE SURGERY  2023   TOTAL KNEE ARTHROPLASTY Right 01/04/2022   Procedure: TOTAL KNEE ARTHROPLASTY;  Surgeon: Kathlynn Sharper, MD;   Location: [facility];  Service: Orthopedics;  Laterality: Right;   TRANSCATHETER AORTIC VALVE REPLACEMENT, TRANSAPICAL  08/18/2018   Procedure: TRANSCATHETER AORTIC VALVE REPLACEMENT; Location: [facility]; Surgeon: Bernardino Lares, MD    Social History   Tobacco Use   Smoking status: Former    Current packs/day: 0.00    Average packs/day: 1 pack/day for 40.0 years (40.0 ttl pk-yrs)    Types: Cigarettes    Start date: 06/18/1972    Quit date: 06/18/2012    Years since quitting: 11.5   Smokeless tobacco: Never  Vaping Use   Vaping status: Never Used  Substance Use Topics   Alcohol  use: Not Currently   Drug use: No     Medication list has been reviewed and updated.  Current Meds  Medication Sig   albuterol  (VENTOLIN  HFA) 108 (90 Base) MCG/ACT inhaler Inhale 2 puffs into the lungs every 6 (six) hours as needed for wheezing or shortness of breath.   aspirin  EC 81 MG tablet Take 1 tablet by mouth daily.   atorvastatin  (LIPITOR) 20 MG tablet Take 1 tablet (20 mg total) by mouth daily. (Patient taking differently: Take 20 mg by mouth every morning.)   azithromycin  (ZITHROMAX  Z-PAK) 250 MG tablet UAD   Blood Glucose Monitoring Suppl DEVI 1 each by Does not apply route in the morning, at noon, and at bedtime. May substitute to any manufacturer covered by patient's insurance.   chlorpheniramine-HYDROcodone (TUSSIONEX) 10-8 MG/5ML Take 5 mLs by mouth every 12 (twelve) hours as needed for cough.   clobetasol cream (TEMOVATE) 0.05 % Apply 1 Application topically as needed.   clopidogrel  (PLAVIX ) 75 MG tablet Take 1 tablet (75 mg total) by mouth daily.   Cyanocobalamin  (VITAMIN B-12 PO) Take 1 tablet by mouth daily at 6 (six) AM.   docusate sodium  (COLACE) 100 MG capsule Take 1 capsule (100 mg total) by mouth 2 (two) times daily. (Patient taking differently: Take 100 mg by mouth 2 (two) times daily as needed.)   Glucose Blood (BLOOD GLUCOSE TEST STRIPS) STRP 1 each by In Vitro route in the morning, at noon,  and at bedtime. May substitute to any manufacturer covered by patient's insurance.   ketoconazole (NIZORAL) 2 % cream Apply 1 Application topically 2 (two) times daily as needed.   Lancet Device MISC 1 each by Does not apply route in the morning, at noon, and at bedtime. May substitute to any manufacturer covered by patient's insurance.   Lancets (ONETOUCH DELICA PLUS LANCET33G) MISC Place 1 each onto the skin 4 (four) times daily as needed.   Lancets Misc. MISC 1 each by Does not apply route in the morning, at noon, and at bedtime. May substitute to any manufacturer covered by patient's insurance.   levothyroxine  (SYNTHROID ) 150 MCG tablet Take 75-150 mcg by mouth See admin instructions. Take 150 mcg daily except Sundays take 75 mcg   lisinopril -hydrochlorothiazide  (ZESTORETIC ) 10-12.5 MG tablet Take 1 tablet by mouth daily.   loratadine (CLARITIN) 10 MG tablet Take 10 mg by mouth every  morning.   metFORMIN  (GLUCOPHAGE -XR) 500 MG 24 hr tablet TAKE 2 TABLETS(1000 MG) BY MOUTH TWICE DAILY (Patient taking differently: 500 mg 2 (two) times daily with a meal. TAKE 2 TABLETS(1000 MG) BY MOUTH TWICE DAILY)   Polyvinyl Alcohol -Povidone (REFRESH OP) Place 1 drop into both eyes as needed.   predniSONE  (DELTASONE ) 10 MG tablet Take 6 tablets (60 mg total) by mouth daily with breakfast for 1 day, THEN 5 tablets (50 mg total) daily with breakfast for 1 day, THEN 4 tablets (40 mg total) daily with breakfast for 1 day, THEN 3 tablets (30 mg total) daily with breakfast for 1 day, THEN 2 tablets (20 mg total) daily with breakfast for 1 day, THEN 1 tablet (10 mg total) daily with breakfast for 1 day.   pregabalin  (LYRICA ) 50 MG capsule 2 in am; 1 at 1 pm; 2 at HS (Patient taking differently: Take 50-100 mg by mouth 3 (three) times daily. 2 in am; 2 at 1 pm; 2 at HS)   senna (SENOKOT) 8.6 MG TABS tablet Take 1 tablet (8.6 mg total) by mouth daily as needed for mild constipation.   traMADol  (ULTRAM ) 50 MG tablet Taking 2  in the AM, 2 at midday and 1 at bedtime   traMADol  (ULTRAM ) 50 MG tablet One tablet orally 5 times per day.       10/29/2023   11:11 AM 07/01/2023   11:17 AM 02/25/2023    9:25 AM 10/22/2022   10:47 AM  GAD 7 : Generalized Anxiety Score  Nervous, Anxious, on Edge 3 0 1 0  Control/stop worrying 0 0 0 0  Worry too much - different things 0 0 1 0  Trouble relaxing 0 0 0 0  Restless 0 0 0 0  Easily annoyed or irritable 1 0 0 0  Afraid - awful might happen 0 0 1 0  Total GAD 7 Score 4 0 3 0  Anxiety Difficulty Not difficult at all Not difficult at all Not difficult at all Not difficult at all       10/29/2023   11:11 AM 07/01/2023   11:16 AM 02/25/2023    9:24 AM  Depression screen PHQ 2/9  Decreased Interest 0 1 0  Down, Depressed, Hopeless 0 1 0  PHQ - 2 Score 0 2 0  Altered sleeping 0 0 0  Tired, decreased energy 0 0 3  Change in appetite 1 0 2  Feeling bad or failure about yourself  0 0 0  Trouble concentrating 0 0 0  Moving slowly or fidgety/restless 0 0 0  Suicidal thoughts 0 0 0  PHQ-9 Score 1 2 5   Difficult doing work/chores Not difficult at all Not difficult at all Not difficult at all    BP Readings from Last 3 Encounters:  01/08/24 124/78  10/29/23 116/66  10/15/23 (!) 116/54    Physical Exam Constitutional:      Appearance: Normal appearance.  Cardiovascular:     Rate and Rhythm: Normal rate and regular rhythm.  Pulmonary:     Effort: Pulmonary effort is normal.     Breath sounds: Examination of the right-upper field reveals wheezing. Examination of the left-upper field reveals wheezing. Decreased breath sounds, wheezing and rhonchi present.  Musculoskeletal:     Cervical back: Normal range of motion.  Lymphadenopathy:     Cervical: No cervical adenopathy.  Neurological:     Mental Status: She is alert.     Wt Readings from Last 3 Encounters:  10/29/23  238 lb 4 oz (108.1 kg)  10/15/23 246 lb (111.6 kg)  10/04/23 243 lb 12.8 oz (110.6 kg)    BP  124/78   Pulse 100   Temp 98.5 F (36.9 C) (Oral)   Ht 5' 8 (1.727 m)   SpO2 96%   BMI 36.23 kg/m   Assessment and Plan:  Problem List Items Addressed This Visit   None Visit Diagnoses       Acute cough    -  Primary   Relevant Medications   chlorpheniramine-HYDROcodone (TUSSIONEX) 10-8 MG/5ML   Other Relevant Orders   POC COVID-19 BinaxNow (Completed)     Bronchitis       Relevant Medications   albuterol  (VENTOLIN  HFA) 108 (90 Base) MCG/ACT inhaler   azithromycin  (ZITHROMAX  Z-PAK) 250 MG tablet   predniSONE  (DELTASONE ) 10 MG tablet       No follow-ups on file.    Leita HILARIO Adie, MD [facility] [place]

## 2024-01-13 ENCOUNTER — Encounter (INDEPENDENT_AMBULATORY_CARE_PROVIDER_SITE_OTHER)

## 2024-01-13 ENCOUNTER — Ambulatory Visit (INDEPENDENT_AMBULATORY_CARE_PROVIDER_SITE_OTHER): Admitting: Vascular Surgery

## 2024-01-13 ENCOUNTER — Encounter: Payer: Self-pay | Admitting: Internal Medicine

## 2024-01-19 DIAGNOSIS — Z1212 Encounter for screening for malignant neoplasm of rectum: Secondary | ICD-10-CM | POA: Diagnosis not present

## 2024-01-19 DIAGNOSIS — Z1211 Encounter for screening for malignant neoplasm of colon: Secondary | ICD-10-CM | POA: Diagnosis not present

## 2024-01-19 LAB — COLOGUARD: Cologuard: NEGATIVE

## 2024-01-24 ENCOUNTER — Other Ambulatory Visit: Payer: Self-pay | Admitting: Internal Medicine

## 2024-01-24 DIAGNOSIS — E1149 Type 2 diabetes mellitus with other diabetic neurological complication: Secondary | ICD-10-CM

## 2024-01-24 DIAGNOSIS — E118 Type 2 diabetes mellitus with unspecified complications: Secondary | ICD-10-CM

## 2024-01-26 LAB — COLOGUARD: COLOGUARD: NEGATIVE

## 2024-01-28 NOTE — Telephone Encounter (Signed)
 Requested Prescriptions  Pending Prescriptions Disp Refills   glucose blood (ONETOUCH ULTRA) test strip [Pharmacy Med Name: ONE TOUCH ULTRA BLUE TESTST(NEW)100] 100 strip 1    Sig: USE AS DIRECTED EVERY MORNING, NOON AND EVERY EVENING AS DIRECTED     Endocrinology: Diabetes - Testing Supplies Passed - 01/28/2024  3:00 PM      Passed - Valid encounter within last 12 months    Recent Outpatient Visits           2 weeks ago Acute cough   [place] [facility], Leita DEL, MD   3 months ago Essential (primary) hypertension   [place] [facility] at [facility], Leita DEL, MD               Lancets [facility] DELICA PLUS [place]) MISC [Pharmacy Med Name: ONE TOUCH DELICA PLUS 33G LANCETS] 100 each 3    Sig: USE AS DIRECTED EVERY MORNING, NOON AND EVERY EVENING     Endocrinology: Diabetes - Testing Supplies Passed - 01/28/2024  3:00 PM      Passed - Valid encounter within last 12 months    Recent Outpatient Visits           2 weeks ago Acute cough    [facility] at [facility], Leita DEL, MD   3 months ago Essential (primary) hypertension   [facility] at [facility], Leita DEL, MD

## 2024-02-03 DIAGNOSIS — D225 Melanocytic nevi of trunk: Secondary | ICD-10-CM | POA: Diagnosis not present

## 2024-02-03 DIAGNOSIS — L218 Other seborrheic dermatitis: Secondary | ICD-10-CM | POA: Diagnosis not present

## 2024-02-03 DIAGNOSIS — Z85820 Personal history of malignant melanoma of skin: Secondary | ICD-10-CM | POA: Diagnosis not present

## 2024-02-03 DIAGNOSIS — Z872 Personal history of diseases of the skin and subcutaneous tissue: Secondary | ICD-10-CM | POA: Diagnosis not present

## 2024-02-03 DIAGNOSIS — L308 Other specified dermatitis: Secondary | ICD-10-CM | POA: Diagnosis not present

## 2024-02-03 DIAGNOSIS — L578 Other skin changes due to chronic exposure to nonionizing radiation: Secondary | ICD-10-CM | POA: Diagnosis not present

## 2024-02-03 DIAGNOSIS — D485 Neoplasm of uncertain behavior of skin: Secondary | ICD-10-CM | POA: Diagnosis not present

## 2024-02-03 DIAGNOSIS — B372 Candidiasis of skin and nail: Secondary | ICD-10-CM | POA: Diagnosis not present

## 2024-02-07 ENCOUNTER — Other Ambulatory Visit: Payer: Self-pay | Admitting: Internal Medicine

## 2024-02-07 DIAGNOSIS — E039 Hypothyroidism, unspecified: Secondary | ICD-10-CM | POA: Diagnosis not present

## 2024-02-07 DIAGNOSIS — E1142 Type 2 diabetes mellitus with diabetic polyneuropathy: Secondary | ICD-10-CM

## 2024-02-07 NOTE — Progress Notes (Unsigned)
 Date:  02/07/2024   Name:  Kendra Walton   DOB:  1951/02/22   MRN:  [id number]   Chief Complaint: No chief complaint on file.  HPI  Review of Systems   Lab Results  Component Value Date   NA 136 10/04/2023   K 4.8 10/04/2023   CO2 23 10/04/2023   GLUCOSE 118 (H) 10/04/2023   BUN 29 (H) 10/04/2023   CREATININE 1.38 (H) 10/04/2023   CALCIUM  8.5 (L) 10/04/2023   EGFR 36 (L) 07/01/2023   GFRNONAA 40 (L) 10/04/2023   Lab Results  Component Value Date   CHOL 171 02/25/2023   HDL 45 02/25/2023   LDLCALC 91 02/25/2023   TRIG 209 (H) 02/25/2023   CHOLHDL 3.8 02/25/2023   Lab Results  Component Value Date   TSH 3.08 02/05/2022   Lab Results  Component Value Date   HGBA1C 6.0 (A) 10/29/2023   Lab Results  Component Value Date   WBC 10.2 07/01/2023   HGB 9.9 (L) 07/01/2023   HCT 31.8 (L) 07/01/2023   MCV 86 07/01/2023   PLT 269 07/01/2023   Lab Results  Component Value Date   ALT 16 02/25/2023   AST 17 02/25/2023   ALKPHOS 83 02/25/2023   BILITOT 0.3 02/25/2023   No results found for: MARIEN BOLLS, VD25OH   Patient Active Problem List   Diagnosis Date Noted   Atherosclerosis of native arteries of the extremities with ulceration (HCC) 09/11/2023   Obesity, morbid (HCC) 07/01/2023   Tobacco use disorder, moderate, in sustained remission 06/22/2022   Chronic kidney disease, stage 3b (HCC) 02/20/2022   S/P TKR (total knee replacement) using cement, right 01/04/2022   Type II diabetes mellitus with complication (HCC) 06/20/2021   Aortic atherosclerosis (HCC) 01/03/2020   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   Hearing decreased 12/13/2015   Hyperlipidemia associated with type 2 diabetes mellitus (HCC) 09/14/2015   History of breast cancer in female 04/07/2015   Acquired hypothyroidism 10/04/2014   Diabetic peripheral neuropathy (HCC) 10/04/2014   Essential (primary) hypertension 10/04/2014   DM (diabetes  mellitus), type 2 with neurological complications (HCC) 10/04/2014    Allergies  Allergen Reactions   Shellfish Allergy Hives and Itching    Past Surgical History:  Procedure Laterality Date   ADRENALECTOMY Right 05/28/2022   AMPUTATION TOE Left 10/04/2023   Procedure: AMPUTATION, TOE;  Surgeon: Ashley Soulier, DPM;  Location: [facility];  Service: Orthopedics/Podiatry;  Laterality: Left;   APPENDECTOMY     CARDIAC VALVE REPLACEMENT  2020   Aortic Valve   CATARACT EXTRACTION W/ INTRAOCULAR LENS  IMPLANT, BILATERAL  2010   CESAREAN SECTION  1983   CHOLECYSTECTOMY  2005   ECTOPIC PREGNANCY SURGERY     LOWER EXTREMITY ANGIOGRAPHY Left 09/24/2023   Procedure: Lower Extremity Angiography;  Surgeon: Jama Cordella MATSU, MD;  Location: [facility];  Service: Cardiovascular;  Laterality: Left;   LOWER EXTREMITY INTERVENTION Left 09/24/2023   Procedure: LOWER EXTREMITY INTERVENTION;  Surgeon: Jama Cordella MATSU, MD;  Location: [facility];  Service: Cardiovascular;  Laterality: Left;   LUMBAR LAMINECTOMY/DECOMPRESSION MICRODISCECTOMY Right 11/08/2021   Procedure: RIGHT L5-S1 MICRODISCECTOMY;  Surgeon: Clois Fret, MD;  Location: [facility];  Service: Neurosurgery;  Laterality: Right;   MASTECTOMY, PARTIAL Right 2010   REDUCTION MAMMAPLASTY Left 2011   RIGHT/LEFT HEART CATH AND CORONARY ANGIOGRAPHY Bilateral 08/06/2018   Procedure: RIGHT/LEFT HEART CATH AND CORONARY ANGIOGRAPHY;  Surgeon: Bosie Vinie LABOR, MD;  Location:  [facility];  Service: Cardiovascular;  Laterality: Bilateral;   SPINE SURGERY  2023   TOTAL KNEE ARTHROPLASTY Right 01/04/2022   Procedure: TOTAL KNEE ARTHROPLASTY;  Surgeon: Kathlynn Sharper, MD;  Location: [facility];  Service: Orthopedics;  Laterality: Right;   TRANSCATHETER AORTIC VALVE REPLACEMENT, TRANSAPICAL  08/18/2018   Procedure: TRANSCATHETER AORTIC VALVE REPLACEMENT; Location: [facility]; Surgeon: Bernardino Lares, MD    Social History   Tobacco  Use   Smoking status: Former    Current packs/day: 0.00    Average packs/day: 1 pack/day for 40.0 years (40.0 ttl pk-yrs)    Types: Cigarettes    Start date: 06/18/1972    Quit date: 06/18/2012    Years since quitting: 11.6   Smokeless tobacco: Never  Vaping Use   Vaping status: Never Used  Substance Use Topics   Alcohol  use: Not Currently   Drug use: No     Medication list has been reviewed and updated.  No outpatient medications have been marked as taking for the 02/07/24 encounter (Orders Only) with Justus Leita DEL, MD.       10/29/2023   11:11 AM 07/01/2023   11:17 AM 02/25/2023    9:25 AM 10/22/2022   10:47 AM  GAD 7 : Generalized Anxiety Score  Nervous, Anxious, on Edge 3 0 1 0  Control/stop worrying 0 0 0 0  Worry too much - different things 0 0 1 0  Trouble relaxing 0 0 0 0  Restless 0 0 0 0  Easily annoyed or irritable 1 0 0 0  Afraid - awful might happen 0 0 1 0  Total GAD 7 Score 4 0 3 0  Anxiety Difficulty Not difficult at all Not difficult at all Not difficult at all Not difficult at all       10/29/2023   11:11 AM 07/01/2023   11:16 AM 02/25/2023    9:24 AM  Depression screen PHQ 2/9  Decreased Interest 0 1 0  Down, Depressed, Hopeless 0 1 0  PHQ - 2 Score 0 2 0  Altered sleeping 0 0 0  Tired, decreased energy 0 0 3  Change in appetite 1 0 2  Feeling bad or failure about yourself  0 0 0  Trouble concentrating 0 0 0  Moving slowly or fidgety/restless 0 0 0  Suicidal thoughts 0 0 0  PHQ-9 Score 1 2 5   Difficult doing work/chores Not difficult at all Not difficult at all Not difficult at all    BP Readings from Last 3 Encounters:  01/08/24 124/78  10/29/23 116/66  10/15/23 (!) 116/54    Physical Exam  Wt Readings from Last 3 Encounters:  10/29/23 238 lb 4 oz (108.1 kg)  10/15/23 246 lb (111.6 kg)  10/04/23 243 lb 12.8 oz (110.6 kg)    There were no vitals taken for this visit.  Assessment and Plan:  Problem List Items Addressed This Visit    None   No follow-ups on file.    Leita HILARIO Justus, MD [facility] [place]

## 2024-02-10 ENCOUNTER — Other Ambulatory Visit: Payer: Self-pay | Admitting: Internal Medicine

## 2024-02-10 ENCOUNTER — Other Ambulatory Visit: Payer: Self-pay

## 2024-02-10 DIAGNOSIS — E1142 Type 2 diabetes mellitus with diabetic polyneuropathy: Secondary | ICD-10-CM

## 2024-02-10 DIAGNOSIS — E1169 Type 2 diabetes mellitus with other specified complication: Secondary | ICD-10-CM

## 2024-02-10 DIAGNOSIS — E1122 Type 2 diabetes mellitus with diabetic chronic kidney disease: Secondary | ICD-10-CM

## 2024-02-10 MED ORDER — METFORMIN HCL ER 500 MG PO TB24: ORAL_TABLET | ORAL | 1 refills | Status: DC

## 2024-02-10 MED ORDER — ATORVASTATIN CALCIUM 20 MG PO TABS: 20.0000 mg | ORAL_TABLET | Freq: Every day | ORAL | 1 refills | Status: DC

## 2024-02-10 MED ORDER — PREGABALIN 50 MG PO CAPS
100.0000 mg | ORAL_CAPSULE | Freq: Three times a day (TID) | ORAL | 0 refills | Status: DC
Start: 2024-02-10 — End: 2024-02-11

## 2024-02-10 NOTE — Progress Notes (Unsigned)
 Date:  02/10/2024   Name:  Kendra Walton   DOB:  12/02/1950   MRN:  [id number]   Chief Complaint: No chief complaint on file.  HPI  Review of Systems   Lab Results  Component Value Date   NA 136 10/04/2023   K 4.8 10/04/2023   CO2 23 10/04/2023   GLUCOSE 118 (H) 10/04/2023   BUN 29 (H) 10/04/2023   CREATININE 1.38 (H) 10/04/2023   CALCIUM  8.5 (L) 10/04/2023   EGFR 36 (L) 07/01/2023   GFRNONAA 40 (L) 10/04/2023   Lab Results  Component Value Date   CHOL 171 02/25/2023   HDL 45 02/25/2023   LDLCALC 91 02/25/2023   TRIG 209 (H) 02/25/2023   CHOLHDL 3.8 02/25/2023   Lab Results  Component Value Date   TSH 3.08 02/05/2022   Lab Results  Component Value Date   HGBA1C 6.0 (A) 10/29/2023   Lab Results  Component Value Date   WBC 10.2 07/01/2023   HGB 9.9 (L) 07/01/2023   HCT 31.8 (L) 07/01/2023   MCV 86 07/01/2023   PLT 269 07/01/2023   Lab Results  Component Value Date   ALT 16 02/25/2023   AST 17 02/25/2023   ALKPHOS 83 02/25/2023   BILITOT 0.3 02/25/2023   No results found for: MARIEN BOLLS, VD25OH   Patient Active Problem List   Diagnosis Date Noted   Atherosclerosis of native arteries of the extremities with ulceration (HCC) 09/11/2023   Obesity, morbid (HCC) 07/01/2023   Tobacco use disorder, moderate, in sustained remission 06/22/2022   Chronic kidney disease, stage 3b (HCC) 02/20/2022   S/P TKR (total knee replacement) using cement, right 01/04/2022   Type II diabetes mellitus with complication (HCC) 06/20/2021   Aortic atherosclerosis (HCC) 01/03/2020   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   Hearing decreased 12/13/2015   Hyperlipidemia associated with type 2 diabetes mellitus (HCC) 09/14/2015   History of breast cancer in female 04/07/2015   Acquired hypothyroidism 10/04/2014   Diabetic peripheral neuropathy (HCC) 10/04/2014   Essential (primary) hypertension 10/04/2014   DM (diabetes  mellitus), type 2 with neurological complications (HCC) 10/04/2014    Allergies  Allergen Reactions   Shellfish Allergy Hives and Itching    Past Surgical History:  Procedure Laterality Date   ADRENALECTOMY Right 05/28/2022   AMPUTATION TOE Left 10/04/2023   Procedure: AMPUTATION, TOE;  Surgeon: Ashley Soulier, DPM;  Location: [facility];  Service: Orthopedics/Podiatry;  Laterality: Left;   APPENDECTOMY     CARDIAC VALVE REPLACEMENT  2020   Aortic Valve   CATARACT EXTRACTION W/ INTRAOCULAR LENS  IMPLANT, BILATERAL  2010   CESAREAN SECTION  1983   CHOLECYSTECTOMY  2005   ECTOPIC PREGNANCY SURGERY     LOWER EXTREMITY ANGIOGRAPHY Left 09/24/2023   Procedure: Lower Extremity Angiography;  Surgeon: Jama Cordella MATSU, MD;  Location: [facility];  Service: Cardiovascular;  Laterality: Left;   LOWER EXTREMITY INTERVENTION Left 09/24/2023   Procedure: LOWER EXTREMITY INTERVENTION;  Surgeon: Jama Cordella MATSU, MD;  Location: [facility];  Service: Cardiovascular;  Laterality: Left;   LUMBAR LAMINECTOMY/DECOMPRESSION MICRODISCECTOMY Right 11/08/2021   Procedure: RIGHT L5-S1 MICRODISCECTOMY;  Surgeon: Clois Fret, MD;  Location: [facility];  Service: Neurosurgery;  Laterality: Right;   MASTECTOMY, PARTIAL Right 2010   REDUCTION MAMMAPLASTY Left 2011   RIGHT/LEFT HEART CATH AND CORONARY ANGIOGRAPHY Bilateral 08/06/2018   Procedure: RIGHT/LEFT HEART CATH AND CORONARY ANGIOGRAPHY;  Surgeon: Bosie Vinie LABOR, MD;  Location:  [facility];  Service: Cardiovascular;  Laterality: Bilateral;   SPINE SURGERY  2023   TOTAL KNEE ARTHROPLASTY Right 01/04/2022   Procedure: TOTAL KNEE ARTHROPLASTY;  Surgeon: Kathlynn Sharper, MD;  Location: [facility];  Service: Orthopedics;  Laterality: Right;   TRANSCATHETER AORTIC VALVE REPLACEMENT, TRANSAPICAL  08/18/2018   Procedure: TRANSCATHETER AORTIC VALVE REPLACEMENT; Location: [facility]; Surgeon: Bernardino Lares, MD    Social History   Tobacco  Use   Smoking status: Former    Current packs/day: 0.00    Average packs/day: 1 pack/day for 40.0 years (40.0 ttl pk-yrs)    Types: Cigarettes    Start date: 06/18/1972    Quit date: 06/18/2012    Years since quitting: 11.6   Smokeless tobacco: Never  Vaping Use   Vaping status: Never Used  Substance Use Topics   Alcohol  use: Not Currently   Drug use: No     Medication list has been reviewed and updated.  No outpatient medications have been marked as taking for the 02/10/24 encounter (Orders Only) with Justus Leita DEL, MD.       10/29/2023   11:11 AM 07/01/2023   11:17 AM 02/25/2023    9:25 AM 10/22/2022   10:47 AM  GAD 7 : Generalized Anxiety Score  Nervous, Anxious, on Edge 3 0 1 0  Control/stop worrying 0 0 0 0  Worry too much - different things 0 0 1 0  Trouble relaxing 0 0 0 0  Restless 0 0 0 0  Easily annoyed or irritable 1 0 0 0  Afraid - awful might happen 0 0 1 0  Total GAD 7 Score 4 0 3 0  Anxiety Difficulty Not difficult at all Not difficult at all Not difficult at all Not difficult at all       10/29/2023   11:11 AM 07/01/2023   11:16 AM 02/25/2023    9:24 AM  Depression screen PHQ 2/9  Decreased Interest 0 1 0  Down, Depressed, Hopeless 0 1 0  PHQ - 2 Score 0 2 0  Altered sleeping 0 0 0  Tired, decreased energy 0 0 3  Change in appetite 1 0 2  Feeling bad or failure about yourself  0 0 0  Trouble concentrating 0 0 0  Moving slowly or fidgety/restless 0 0 0  Suicidal thoughts 0 0 0  PHQ-9 Score 1 2 5   Difficult doing work/chores Not difficult at all Not difficult at all Not difficult at all    BP Readings from Last 3 Encounters:  01/08/24 124/78  10/29/23 116/66  10/15/23 (!) 116/54    Physical Exam  Wt Readings from Last 3 Encounters:  10/29/23 238 lb 4 oz (108.1 kg)  10/15/23 246 lb (111.6 kg)  10/04/23 243 lb 12.8 oz (110.6 kg)    There were no vitals taken for this visit.  Assessment and Plan:  Problem List Items Addressed This Visit    None   No follow-ups on file.    Leita HILARIO Justus, MD [facility] [place]

## 2024-02-10 NOTE — Telephone Encounter (Unsigned)
 Copied from CRM #[id number]. Topic: Clinical - Medication Refill >> Feb 10, 2024  3:34 PM Marissa P wrote: Medication: pregabalin  (LYRICA ) 50 MG capsule  Has the patient contacted their pharmacy? Yes (Agent: If no, request that the patient contact the pharmacy for the refill. If patient does not wish to contact the pharmacy document the reason why and proceed with request.) (Agent: If yes, when and what did the pharmacy advise?)  This is the patient's preferred pharmacy:  [facility]  On [address] in the [place]   Is this the correct pharmacy for this prescription? Yes If no, delete pharmacy and type the correct one.   Has the prescription been filled recently? Yes  Is the patient out of the medication? Yes  Has the patient been seen for an appointment in the last year OR does the patient have an upcoming appointment? Yes  Can we respond through MyChart? Yes  Agent: Please be advised that Rx refills may take up to 3 business days. We ask that you follow-up with your pharmacy.

## 2024-02-11 ENCOUNTER — Encounter: Payer: Self-pay | Admitting: Internal Medicine

## 2024-02-11 ENCOUNTER — Other Ambulatory Visit: Payer: Self-pay | Admitting: Internal Medicine

## 2024-02-11 ENCOUNTER — Other Ambulatory Visit: Payer: Self-pay

## 2024-02-11 DIAGNOSIS — E1169 Type 2 diabetes mellitus with other specified complication: Secondary | ICD-10-CM

## 2024-02-11 DIAGNOSIS — E1142 Type 2 diabetes mellitus with diabetic polyneuropathy: Secondary | ICD-10-CM

## 2024-02-11 DIAGNOSIS — I10 Essential (primary) hypertension: Secondary | ICD-10-CM

## 2024-02-11 DIAGNOSIS — E1122 Type 2 diabetes mellitus with diabetic chronic kidney disease: Secondary | ICD-10-CM

## 2024-02-11 MED ORDER — LISINOPRIL-HYDROCHLOROTHIAZIDE 10-12.5 MG PO TABS
1.0000 | ORAL_TABLET | Freq: Every day | ORAL | 2 refills | Status: AC
Start: 2024-02-11 — End: ?

## 2024-02-11 MED ORDER — ATORVASTATIN CALCIUM 20 MG PO TABS: 20.0000 mg | ORAL_TABLET | Freq: Every day | ORAL | 1 refills | Status: AC

## 2024-02-11 MED ORDER — PREGABALIN 50 MG PO CAPS: 100.0000 mg | ORAL_CAPSULE | Freq: Three times a day (TID) | ORAL | 0 refills | Status: DC

## 2024-02-11 MED ORDER — METFORMIN HCL ER 500 MG PO TB24: ORAL_TABLET | ORAL | 1 refills | Status: AC

## 2024-02-11 NOTE — Progress Notes (Unsigned)
 Date:  02/11/2024   Name:  Kendra Walton   DOB:  December 24, 1950   MRN:  [id number]   Chief Complaint: No chief complaint on file.  HPI  Review of Systems   Lab Results  Component Value Date   NA 136 10/04/2023   K 4.8 10/04/2023   CO2 23 10/04/2023   GLUCOSE 118 (H) 10/04/2023   BUN 29 (H) 10/04/2023   CREATININE 1.38 (H) 10/04/2023   CALCIUM  8.5 (L) 10/04/2023   EGFR 36 (L) 07/01/2023   GFRNONAA 40 (L) 10/04/2023   Lab Results  Component Value Date   CHOL 171 02/25/2023   HDL 45 02/25/2023   LDLCALC 91 02/25/2023   TRIG 209 (H) 02/25/2023   CHOLHDL 3.8 02/25/2023   Lab Results  Component Value Date   TSH 3.08 02/05/2022   Lab Results  Component Value Date   HGBA1C 6.0 (A) 10/29/2023   Lab Results  Component Value Date   WBC 10.2 07/01/2023   HGB 9.9 (L) 07/01/2023   HCT 31.8 (L) 07/01/2023   MCV 86 07/01/2023   PLT 269 07/01/2023   Lab Results  Component Value Date   ALT 16 02/25/2023   AST 17 02/25/2023   ALKPHOS 83 02/25/2023   BILITOT 0.3 02/25/2023   No results found for: MARIEN BOLLS, VD25OH   Patient Active Problem List   Diagnosis Date Noted   Atherosclerosis of native arteries of the extremities with ulceration (HCC) 09/11/2023   Obesity, morbid (HCC) 07/01/2023   Tobacco use disorder, moderate, in sustained remission 06/22/2022   Chronic kidney disease, stage 3b (HCC) 02/20/2022   S/P TKR (total knee replacement) using cement, right 01/04/2022   Type II diabetes mellitus with complication (HCC) 06/20/2021   Aortic atherosclerosis (HCC) 01/03/2020   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   Hearing decreased 12/13/2015   Hyperlipidemia associated with type 2 diabetes mellitus (HCC) 09/14/2015   History of breast cancer in female 04/07/2015   Acquired hypothyroidism 10/04/2014   Diabetic peripheral neuropathy (HCC) 10/04/2014   Essential (primary) hypertension 10/04/2014   DM (diabetes  mellitus), type 2 with neurological complications (HCC) 10/04/2014    Allergies  Allergen Reactions   Shellfish Allergy Hives and Itching    Past Surgical History:  Procedure Laterality Date   ADRENALECTOMY Right 05/28/2022   AMPUTATION TOE Left 10/04/2023   Procedure: AMPUTATION, TOE;  Surgeon: Ashley Soulier, DPM;  Location: [facility];  Service: Orthopedics/Podiatry;  Laterality: Left;   APPENDECTOMY     CARDIAC VALVE REPLACEMENT  2020   Aortic Valve   CATARACT EXTRACTION W/ INTRAOCULAR LENS  IMPLANT, BILATERAL  2010   CESAREAN SECTION  1983   CHOLECYSTECTOMY  2005   ECTOPIC PREGNANCY SURGERY     LOWER EXTREMITY ANGIOGRAPHY Left 09/24/2023   Procedure: Lower Extremity Angiography;  Surgeon: Jama Cordella MATSU, MD;  Location: [facility];  Service: Cardiovascular;  Laterality: Left;   LOWER EXTREMITY INTERVENTION Left 09/24/2023   Procedure: LOWER EXTREMITY INTERVENTION;  Surgeon: Jama Cordella MATSU, MD;  Location: [facility];  Service: Cardiovascular;  Laterality: Left;   LUMBAR LAMINECTOMY/DECOMPRESSION MICRODISCECTOMY Right 11/08/2021   Procedure: RIGHT L5-S1 MICRODISCECTOMY;  Surgeon: Clois Fret, MD;  Location: [facility];  Service: Neurosurgery;  Laterality: Right;   MASTECTOMY, PARTIAL Right 2010   REDUCTION MAMMAPLASTY Left 2011   RIGHT/LEFT HEART CATH AND CORONARY ANGIOGRAPHY Bilateral 08/06/2018   Procedure: RIGHT/LEFT HEART CATH AND CORONARY ANGIOGRAPHY;  Surgeon: Bosie Vinie LABOR, MD;  Location:  [facility];  Service: Cardiovascular;  Laterality: Bilateral;   SPINE SURGERY  2023   TOTAL KNEE ARTHROPLASTY Right 01/04/2022   Procedure: TOTAL KNEE ARTHROPLASTY;  Surgeon: Kathlynn Sharper, MD;  Location: [facility];  Service: Orthopedics;  Laterality: Right;   TRANSCATHETER AORTIC VALVE REPLACEMENT, TRANSAPICAL  08/18/2018   Procedure: TRANSCATHETER AORTIC VALVE REPLACEMENT; Location: [facility]; Surgeon: Bernardino Lares, MD    Social History   Tobacco  Use   Smoking status: Former    Current packs/day: 0.00    Average packs/day: 1 pack/day for 40.0 years (40.0 ttl pk-yrs)    Types: Cigarettes    Start date: 06/18/1972    Quit date: 06/18/2012    Years since quitting: 11.6   Smokeless tobacco: Never  Vaping Use   Vaping status: Never Used  Substance Use Topics   Alcohol  use: Not Currently   Drug use: No     Medication list has been reviewed and updated.  No outpatient medications have been marked as taking for the 02/11/24 encounter (Orders Only) with Justus Leita DEL, MD.       10/29/2023   11:11 AM 07/01/2023   11:17 AM 02/25/2023    9:25 AM 10/22/2022   10:47 AM  GAD 7 : Generalized Anxiety Score  Nervous, Anxious, on Edge 3 0 1 0  Control/stop worrying 0 0 0 0  Worry too much - different things 0 0 1 0  Trouble relaxing 0 0 0 0  Restless 0 0 0 0  Easily annoyed or irritable 1 0 0 0  Afraid - awful might happen 0 0 1 0  Total GAD 7 Score 4 0 3 0  Anxiety Difficulty Not difficult at all Not difficult at all Not difficult at all Not difficult at all       10/29/2023   11:11 AM 07/01/2023   11:16 AM 02/25/2023    9:24 AM  Depression screen PHQ 2/9  Decreased Interest 0 1 0  Down, Depressed, Hopeless 0 1 0  PHQ - 2 Score 0 2 0  Altered sleeping 0 0 0  Tired, decreased energy 0 0 3  Change in appetite 1 0 2  Feeling bad or failure about yourself  0 0 0  Trouble concentrating 0 0 0  Moving slowly or fidgety/restless 0 0 0  Suicidal thoughts 0 0 0  PHQ-9 Score 1 2 5   Difficult doing work/chores Not difficult at all Not difficult at all Not difficult at all    BP Readings from Last 3 Encounters:  01/08/24 124/78  10/29/23 116/66  10/15/23 (!) 116/54    Physical Exam  Wt Readings from Last 3 Encounters:  10/29/23 238 lb 4 oz (108.1 kg)  10/15/23 246 lb (111.6 kg)  10/04/23 243 lb 12.8 oz (110.6 kg)    There were no vitals taken for this visit.  Assessment and Plan:  Problem List Items Addressed This Visit    None   No follow-ups on file.    Leita HILARIO Justus, MD [facility] [place]

## 2024-02-14 DIAGNOSIS — E039 Hypothyroidism, unspecified: Secondary | ICD-10-CM | POA: Diagnosis not present

## 2024-02-14 LAB — HM DIABETES FOOT EXAM

## 2024-02-21 ENCOUNTER — Encounter: Payer: Self-pay | Admitting: Orthopedic Surgery

## 2024-02-21 NOTE — Progress Notes (Signed)
 Referring Physician:  Justus Leita DEL, MD [address] [place],  [place] [address]  Primary Physician:  Justus Leita DEL, MD  History of Present Illness: 02/25/2024 [name] has a history of HTN, hypothyroidism, DM with neuropathy, hyperlipidemia, CKD stage 3b, breast CA, history of aortic valve replacement, obesity.   History of right L5-S1 microdiscectomy on 11/08/21 by Dr. Clois. This did not help the back pain.   She has constant LBP with no leg pain x years. No numbness, tingling, or weakness. Pain is worse with lifting, pushing, pulling (mopping, vacuuming). Some relief with sitting, but pain gets worse when she stands up again.   Also with 2 week history of numbness/tingling in right hand. Hand can burn and be painful. Woke up this morning with more intermittent numbness/tingling in left hand. No weakness in her hands. No neck or arm.   She has improvement in calf pain with below injections.   She is on PLAVIX .   Tobacco use: Does not smoke.   Bowel/Bladder Dysfunction: urinary urgency x 10 years. No bowel issues.   Conservative measures:  Physical therapy: has not participated in  Multimodal medical therapy including regular antiinflammatories: tramadol , lyrica   Injections:  10/03/2022: Left L5-S1 and right S1 transforaminal ESI 04/17/2022: Left L5-S1 and right S1 transforaminal ESI   Past Surgery:  11/08/2021: LUMBAR LAMINECTOMY/DECOMPRESSION MICRODISCECTOMY   [name] has no symptoms of cervical myelopathy.  The symptoms are causing a significant impact on the patient's life.   Review of Systems:  A 10 point review of systems is negative, except for the pertinent positives and negatives detailed in the HPI.  Past Medical History: Past Medical History:  Diagnosis Date   Absolute anemia 09/15/2015   Patient declines colonoscopy    Allergy    Shellfish   Aortic stenosis, moderate 04/05/2017   a.) s/p TAVR  08/18/2018; LEFT axillary approach   Arthritis    Breast cancer, right (HCC) 2010   a.) s/p RIGHT mastectomy; no chemotherpay or XRT   CAD (coronary artery disease) 08/06/2018   a.) R/LHC 08/06/2018: EF 35%; 20% stenosis oRI; severe AS (MPG 48.2 mmHg; AVA (VTI) = 0.58 cm); mean PA = 36 mmHg, mean PCWP = 27 mmHg, PVR 1.78 WU, CO 5.06 L/min, CI 2.25 L/min/m   CHF (congestive heart failure) (HCC)    a.)  TTE 07/29/2018: EF 35%; moderate global HK; moderate LVH; mild LA dilation; trivial TR/PR, mild AR/MR; moderate AS (MPG 48.2 mmHg); G2DD. b.) R/LHC 08/06/2018; EF 35%; mean PCWP 27 mmHg. c.)  TTE 10/20/2019: EF >55%; mild LVH; trivial MR/TR, mild PR, prostatic AoV. d.)  TTE 08/17/2021: EF >55%; mild LVH; mild LA dilation; trivial MR/TR/PR; prostatic AoV (MPG 12 mmHg); G1DD.   CKD (chronic kidney disease), stage III (HCC)    Ectopic pregnancy, tubal    History of transcatheter aortic valve replacement (TAVR) 08/18/2018   a.) 26 mm Medtronic Corevalve Evolute   HLD (hyperlipidemia)    Hypertension    Hypothyroidism    Leukocytosis 09/15/2015   Hematology eval - likely benign; follow up planned    Neuropathy    Osteomyelitis (HCC)    Post-menopausal bleeding 04/13/2016   US  showed thickened endometrium; biopsy recommended but patient declined   Right adrenal mass (HCC) 02/06/2019   a.) CT CAP 08/14/2018: measured 4.3 x 3.3 cm. b.) CT abd 02/06/2019: measured 4.7 x 3.7 cm. c.) MRI lumbar spine 10/25/2021: interval increase to 7.1 x 7.1 cm.   T2DM (type  2 diabetes mellitus) [facility])     Past Surgical History: Past Surgical History:  Procedure Laterality Date   ADRENALECTOMY Right 05/28/2022   AMPUTATION TOE Left 10/04/2023   Procedure: AMPUTATION, TOE;  Surgeon: Ashley Soulier, DPM;  Location: [facility];  Service: Orthopedics/Podiatry;  Laterality: Left;   APPENDECTOMY     CARDIAC VALVE REPLACEMENT  2020   Aortic Valve   CATARACT EXTRACTION W/ INTRAOCULAR LENS  IMPLANT, BILATERAL  2010    CESAREAN SECTION  1983   CHOLECYSTECTOMY  2005   ECTOPIC PREGNANCY SURGERY     LOWER EXTREMITY ANGIOGRAPHY Left 09/24/2023   Procedure: Lower Extremity Angiography;  Surgeon: Jama Cordella MATSU, MD;  Location: [facility];  Service: Cardiovascular;  Laterality: Left;   LOWER EXTREMITY INTERVENTION Left 09/24/2023   Procedure: LOWER EXTREMITY INTERVENTION;  Surgeon: Jama Cordella MATSU, MD;  Location: [facility];  Service: Cardiovascular;  Laterality: Left;   LUMBAR LAMINECTOMY/DECOMPRESSION MICRODISCECTOMY Right 11/08/2021   Procedure: RIGHT L5-S1 MICRODISCECTOMY;  Surgeon: Clois Fret, MD;  Location: [facility];  Service: Neurosurgery;  Laterality: Right;   MASTECTOMY, PARTIAL Right 2010   REDUCTION MAMMAPLASTY Left 2011   RIGHT/LEFT HEART CATH AND CORONARY ANGIOGRAPHY Bilateral 08/06/2018   Procedure: RIGHT/LEFT HEART CATH AND CORONARY ANGIOGRAPHY;  Surgeon: Bosie Vinie LABOR, MD;  Location: [facility];  Service: Cardiovascular;  Laterality: Bilateral;   SPINE SURGERY  2023   TOTAL KNEE ARTHROPLASTY Right 01/04/2022   Procedure: TOTAL KNEE ARTHROPLASTY;  Surgeon: Kathlynn Sharper, MD;  Location: [facility];  Service: Orthopedics;  Laterality: Right;   TRANSCATHETER AORTIC VALVE REPLACEMENT, TRANSAPICAL  08/18/2018   Procedure: TRANSCATHETER AORTIC VALVE REPLACEMENT; Location: [facility]; Surgeon: Bernardino Lares, MD    Allergies: Allergies as of 02/25/2024 - Review Complete 02/25/2024  Allergen Reaction Noted   Shellfish allergy Hives and Itching 08/18/2018    Medications: Outpatient Encounter Medications as of 02/25/2024  Medication Sig   aspirin  EC 81 MG tablet Take 1 tablet by mouth daily.   atorvastatin  (LIPITOR) 20 MG tablet Take 1 tablet (20 mg total) by mouth daily.   Blood Glucose Monitoring Suppl DEVI 1 each by Does not apply route in the morning, at noon, and at bedtime. May substitute to any manufacturer covered by patient's insurance.   clobetasol cream  (TEMOVATE) 0.05 % Apply 1 Application topically as needed.   clopidogrel  (PLAVIX ) 75 MG tablet Take 1 tablet (75 mg total) by mouth daily.   Cyanocobalamin  (VITAMIN B-12 PO) Take 1 tablet by mouth daily at 6 (six) AM.   docusate sodium  (COLACE) 100 MG capsule Take 1 capsule (100 mg total) by mouth 2 (two) times daily. (Patient taking differently: Take 100 mg by mouth 2 (two) times daily as needed.)   glucose blood (ONETOUCH ULTRA) test strip USE AS DIRECTED EVERY MORNING, NOON AND EVERY EVENING AS DIRECTED   ketoconazole (NIZORAL) 2 % cream Apply 1 Application topically 2 (two) times daily as needed.   Lancets (ONETOUCH DELICA PLUS LANCET33G) MISC USE AS DIRECTED EVERY MORNING, NOON AND EVERY EVENING   levothyroxine  (SYNTHROID ) 150 MCG tablet Take 75-150 mcg by mouth See admin instructions. Take 150 mcg daily except Sundays take 75 mcg   lisinopril -hydrochlorothiazide  (ZESTORETIC ) 10-12.5 MG tablet Take 1 tablet by mouth daily.   loratadine (CLARITIN) 10 MG tablet Take 10 mg by mouth every morning.   metFORMIN  (GLUCOPHAGE -XR) 500 MG 24 hr tablet TAKE 2 TABLETS(1000 MG) BY MOUTH TWICE DAILY   Polyvinyl Alcohol -Povidone (REFRESH OP) Place 1 drop into both  eyes as needed.   pregabalin  (LYRICA ) 50 MG capsule Take 2 capsules (100 mg total) by mouth 3 (three) times daily.   senna (SENOKOT) 8.6 MG TABS tablet Take 1 tablet (8.6 mg total) by mouth daily as needed for mild constipation.   traMADol  (ULTRAM ) 50 MG tablet Taking 2 in the AM, 2 at midday and 1 at bedtime   [DISCONTINUED] albuterol  (VENTOLIN  HFA) 108 (90 Base) MCG/ACT inhaler Inhale 2 puffs into the lungs every 6 (six) hours as needed for wheezing or shortness of breath.   [DISCONTINUED] chlorpheniramine-HYDROcodone (TUSSIONEX) 10-8 MG/5ML Take 5 mLs by mouth every 12 (twelve) hours as needed for cough.   [DISCONTINUED] traMADol  (ULTRAM ) 50 MG tablet One tablet orally 5 times per day.   No facility-administered encounter medications on file as  of 02/25/2024.    Social History: Social History   Tobacco Use   Smoking status: Former    Current packs/day: 0.00    Average packs/day: 1 pack/day for 40.0 years (40.0 ttl pk-yrs)    Types: Cigarettes    Start date: 06/18/1972    Quit date: 06/18/2012    Years since quitting: 11.6   Smokeless tobacco: Never  Vaping Use   Vaping status: Never Used  Substance Use Topics   Alcohol  use: Not Currently   Drug use: No    Family Medical History: Family History  Problem Relation Age of Onset   Hypertension Brother    Diabetes Brother    Early death Brother    Vision loss Brother    Drug abuse Brother    Breast cancer Neg Hx     Physical Examination: Vitals:   02/25/24 1014  BP: 128/70    General: Patient is well developed, well nourished, calm, collected, and in no apparent distress. Attention to examination is appropriate.  Respiratory: Patient is breathing without any difficulty.   NEUROLOGICAL:     Awake, alert, oriented to person, place, and time.  Speech is clear and fluent. Fund of knowledge is appropriate.   Cranial Nerves: Pupils equal round and reactive to light.  Facial tone is symmetric.    Well healed lumbar incision.    No abnormal lesions on exposed skin.   Strength: Side Biceps Triceps Deltoid Interossei Grip Wrist Ext. Wrist Flex.  R 5 5 5 5 5 5 5   L 5 5 5 5 5 5 5    Side Iliopsoas Quads Hamstring PF DF EHL  R 5 5 5 5 5 5   L 5 5 5 5 5 5    Reflexes are 2+ and symmetric at the biceps, brachioradialis, patella and achilles.   Hoffman's is absent.  Clonus is not present.   Bilateral upper and lower extremity sensation is intact to light touch.     No pain with IR/ER of both hips.   She has positive tinels at wrist on right, negative on left. Positive phalens on right, negative on left.   Gait is normal.     Medical Decision Making  Imaging: none  Assessment and Plan: Ms. Faulkenberry has a history of right L5-S1 microdiscectomy on 11/08/21  by Dr. Clois. This did not help the back pain.   She has constant LBP with no leg pain x years. No numbness, tingling, or weakness. Some relief with sitting, but pain gets worse when she stands up again. Grocery cart helps.   Also with 2 week history of numbness/tingling in right hand. She has intermittent burning pain in her right hand. She woke up this  morning with more intermittent numbness/tingling in left hand. No weakness in her hands. No neck or arm pain.   No recent lumbar imaging.   Exam is suspicious for carpal tunnel syndrome.   Treatment options discussed with patient and following plan made:   - MRI of lumbar spine ordered to evaluate chronic LBP. Symptoms are suspicious for spinal stenosis.  - EMG/NCS of bilateral upper extremities ordered to evaluate numbness/tingling in hands. Orders to Dr. Maree at [facility] neurology.  - She would like me to send her a MyChart message with her MRI results. Can do this with EMG results as well.   I spent a total of 35 minutes in face-to-face and non-face-to-face activities related to this patient's care today including review of outside records, review of imaging, review of symptoms, physical exam, discussion of differential diagnosis, discussion of treatment options, and documentation.   Thank you for involving me in the care of this patient.   Glade Boys PA-C Dept. of Neurosurgery

## 2024-02-25 ENCOUNTER — Telehealth: Payer: Self-pay | Admitting: Orthopedic Surgery

## 2024-02-25 ENCOUNTER — Ambulatory Visit (INDEPENDENT_AMBULATORY_CARE_PROVIDER_SITE_OTHER): Admitting: Orthopedic Surgery

## 2024-02-25 ENCOUNTER — Encounter: Payer: Self-pay | Admitting: Orthopedic Surgery

## 2024-02-25 VITALS — BP 128/70 | Ht 68.0 in | Wt 245.0 lb

## 2024-02-25 DIAGNOSIS — Z9889 Other specified postprocedural states: Secondary | ICD-10-CM

## 2024-02-25 DIAGNOSIS — M545 Low back pain, unspecified: Secondary | ICD-10-CM

## 2024-02-25 DIAGNOSIS — G8929 Other chronic pain: Secondary | ICD-10-CM

## 2024-02-25 DIAGNOSIS — R200 Anesthesia of skin: Secondary | ICD-10-CM | POA: Diagnosis not present

## 2024-02-25 DIAGNOSIS — R202 Paresthesia of skin: Secondary | ICD-10-CM | POA: Diagnosis not present

## 2024-02-25 DIAGNOSIS — R208 Other disturbances of skin sensation: Secondary | ICD-10-CM

## 2024-02-25 NOTE — Patient Instructions (Signed)
 It was so nice to see you today. Thank you so much for coming in.    I want to get an MRI of your lower back to look into things further. We will get this approved through your insurance and [facility] will call you to schedule the appointment. Ask about your patient responsibility. You do not need to pay this prior to getting MRI, they can bill you.   [facility] (building with the white pillars) is located off of [address]. The address is [address] [place], [place] [address].    After you have the MRI, it can take 14-28 days for me to get the results back. If I don't have them in 2 weeks, we will call to try to get the results.   Once I have the results, I will send you a message.   I want you to see neurology at the [facility] clinic for  an EMG (nerve test) for further evaluation. I want you to see Dr. Maree. They should call you to schedule an appointment or you can call them at [phone].   You can wear your wrist braces at night to help with numbness/tingling. Can do during the day as needed.   Please do not hesitate to call if you have any questions or concerns. You can also message me in MyChart.   Glade Boys PA-C [phone]     The physicians and staff at [facility] are committed to providing excellent care. You may receive a survey asking for feedback about your experience at our office. We value you your feedback and appreciate you taking the time to to fill it out. The [facility] leadership team is also available to discuss your experience in person, feel free to contact us  [phone].

## 2024-02-25 NOTE — Telephone Encounter (Signed)
 I put orders in for bilateral upper extremity EMG to be done at [facility] with Dr. Maree.

## 2024-02-26 ENCOUNTER — Telehealth: Payer: Self-pay | Admitting: Orthopedic Surgery

## 2024-02-26 DIAGNOSIS — Z9889 Other specified postprocedural states: Secondary | ICD-10-CM

## 2024-02-26 DIAGNOSIS — G8929 Other chronic pain: Secondary | ICD-10-CM

## 2024-02-26 MED ORDER — DIAZEPAM 5 MG PO TABS: ORAL_TABLET | ORAL | 0 refills | Status: DC

## 2024-02-26 NOTE — Telephone Encounter (Signed)
 Patient is calling to let our office know that she is scheduled for her MRI on 03/02/2024 and that she is Claustrophobic. She is requesting something to help her get through the MRI be sent to [facility] in [place].

## 2024-02-26 NOTE — Telephone Encounter (Signed)
 Valium  sent to pharmacy. Take as directed before MRI. Remind her she will need a driver.

## 2024-03-02 ENCOUNTER — Ambulatory Visit
Admission: RE | Admit: 2024-03-02 | Discharge: 2024-03-02 | Disposition: A | Discharge status: Home / Self Care | Source: Ambulatory Visit | Attending: Orthopedic Surgery | Admitting: Orthopedic Surgery

## 2024-03-02 ENCOUNTER — Encounter: Admitting: Internal Medicine

## 2024-03-02 DIAGNOSIS — G8929 Other chronic pain: Secondary | ICD-10-CM | POA: Insufficient documentation

## 2024-03-02 DIAGNOSIS — M5127 Other intervertebral disc displacement, lumbosacral region: Secondary | ICD-10-CM | POA: Diagnosis not present

## 2024-03-02 DIAGNOSIS — Z9889 Other specified postprocedural states: Secondary | ICD-10-CM | POA: Diagnosis not present

## 2024-03-02 DIAGNOSIS — M545 Low back pain, unspecified: Secondary | ICD-10-CM | POA: Diagnosis not present

## 2024-03-02 DIAGNOSIS — M4807 Spinal stenosis, lumbosacral region: Secondary | ICD-10-CM | POA: Diagnosis not present

## 2024-03-02 DIAGNOSIS — M5136 Other intervertebral disc degeneration, lumbar region with discogenic back pain only: Secondary | ICD-10-CM | POA: Diagnosis not present

## 2024-03-09 ENCOUNTER — Encounter: Payer: Self-pay | Admitting: Orthopedic Surgery

## 2024-03-09 DIAGNOSIS — Z9889 Other specified postprocedural states: Secondary | ICD-10-CM

## 2024-03-09 DIAGNOSIS — D235 Other benign neoplasm of skin of trunk: Secondary | ICD-10-CM | POA: Diagnosis not present

## 2024-03-09 DIAGNOSIS — D225 Melanocytic nevi of trunk: Secondary | ICD-10-CM | POA: Diagnosis not present

## 2024-03-09 DIAGNOSIS — M47816 Spondylosis without myelopathy or radiculopathy, lumbar region: Secondary | ICD-10-CM

## 2024-03-09 NOTE — Telephone Encounter (Signed)
 Lumbar MRI dated 03/02/24:  FINDINGS:   BONES AND ALIGNMENT: Grade 1 anterolisthesis at L4-5 is unchanged. Bone marrow signal is unremarkable.   SPINAL CORD: Conus medullaris terminates at the L2-3 level.   SOFT TISSUES: No paraspinal mass. Previously seen right renal mass is no longer visible.   L1-L2: No significant disc herniation. No spinal canal stenosis or neural foraminal narrowing.   L2-L3: Small disc bulge and mild facet hypertrophy. No spinal canal stenosis or neural foraminal narrowing.   L3-L4: Mild facet hypertrophy. No disc herniation. No spinal canal stenosis or neural foraminal narrowing.   L4-L5: Left asymmetric disc bulge with mild facet hypertrophy. No spinal canal stenosis or neural foraminal narrowing.   L5-S1: Mild facet hypertrophy with right subarticular disc protrusion. Moderate right foraminal stenosis, slightly worsened from the prior study. Narrowing of the right lateral recess, unchanged.   IMPRESSION: 1. Right subarticular disc protrusion at L5-S1 with moderate right foraminal stenosis, slightly worsened from the prior study. 2. Left asymmetric disc bulge at L4-5 with mild facet hypertrophy. 3. Small disc bulge at L2-3 with mild facet hypertrophy.   Electronically signed by: Franky Stanford MD 03/07/2024 01:17 AM EDT RP Workstation: [id number]   I have personally reviewed the images and agree with the above interpretation.

## 2024-03-09 NOTE — Addendum Note (Signed)
 Addended byBETHA HILMA HASTINGS on: 03/09/2024 10:29 AM   Modules accepted: Orders

## 2024-03-16 DIAGNOSIS — M5412 Radiculopathy, cervical region: Secondary | ICD-10-CM | POA: Diagnosis not present

## 2024-03-16 DIAGNOSIS — M48062 Spinal stenosis, lumbar region with neurogenic claudication: Secondary | ICD-10-CM | POA: Diagnosis not present

## 2024-03-16 DIAGNOSIS — M79672 Pain in left foot: Secondary | ICD-10-CM | POA: Diagnosis not present

## 2024-03-16 DIAGNOSIS — M5416 Radiculopathy, lumbar region: Secondary | ICD-10-CM | POA: Diagnosis not present

## 2024-03-16 DIAGNOSIS — L97522 Non-pressure chronic ulcer of other part of left foot with fat layer exposed: Secondary | ICD-10-CM | POA: Diagnosis not present

## 2024-03-16 DIAGNOSIS — E1142 Type 2 diabetes mellitus with diabetic polyneuropathy: Secondary | ICD-10-CM | POA: Diagnosis not present

## 2024-03-16 DIAGNOSIS — I739 Peripheral vascular disease, unspecified: Secondary | ICD-10-CM | POA: Diagnosis not present

## 2024-03-23 ENCOUNTER — Encounter: Payer: Self-pay | Admitting: Internal Medicine

## 2024-03-23 ENCOUNTER — Ambulatory Visit (INDEPENDENT_AMBULATORY_CARE_PROVIDER_SITE_OTHER): Admitting: Internal Medicine

## 2024-03-23 VITALS — BP 124/76 | HR 106 | Ht 68.0 in | Wt 242.0 lb

## 2024-03-23 DIAGNOSIS — D0339 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: Secondary | ICD-10-CM | POA: Diagnosis not present

## 2024-03-23 DIAGNOSIS — E1169 Type 2 diabetes mellitus with other specified complication: Secondary | ICD-10-CM | POA: Diagnosis not present

## 2024-03-23 DIAGNOSIS — Z7984 Long term (current) use of oral hypoglycemic drugs: Secondary | ICD-10-CM

## 2024-03-23 DIAGNOSIS — I5023 Acute on chronic systolic (congestive) heart failure: Secondary | ICD-10-CM | POA: Insufficient documentation

## 2024-03-23 DIAGNOSIS — I10 Essential (primary) hypertension: Secondary | ICD-10-CM

## 2024-03-23 DIAGNOSIS — Z23 Encounter for immunization: Secondary | ICD-10-CM | POA: Diagnosis not present

## 2024-03-23 DIAGNOSIS — Z0000 Encounter for general adult medical examination without abnormal findings: Secondary | ICD-10-CM

## 2024-03-23 DIAGNOSIS — L97524 Non-pressure chronic ulcer of other part of left foot with necrosis of bone: Secondary | ICD-10-CM | POA: Insufficient documentation

## 2024-03-23 DIAGNOSIS — E118 Type 2 diabetes mellitus with unspecified complications: Secondary | ICD-10-CM | POA: Diagnosis not present

## 2024-03-23 DIAGNOSIS — E039 Hypothyroidism, unspecified: Secondary | ICD-10-CM | POA: Diagnosis not present

## 2024-03-23 DIAGNOSIS — Z853 Personal history of malignant neoplasm of breast: Secondary | ICD-10-CM

## 2024-03-23 DIAGNOSIS — N1832 Chronic kidney disease, stage 3b: Secondary | ICD-10-CM

## 2024-03-23 DIAGNOSIS — E1142 Type 2 diabetes mellitus with diabetic polyneuropathy: Secondary | ICD-10-CM | POA: Diagnosis not present

## 2024-03-23 DIAGNOSIS — E785 Hyperlipidemia, unspecified: Secondary | ICD-10-CM | POA: Diagnosis not present

## 2024-03-23 MED ORDER — TRAMADOL HCL 50 MG PO TABS
ORAL_TABLET | ORAL | 0 refills | Status: DC
Start: 2024-03-23 — End: 2024-04-24

## 2024-03-23 NOTE — Assessment & Plan Note (Signed)
 Currently medications are MTF.  No hypoglycemic episodes noted. Home blood sugars in the 130 range. Last visit medical regimen changes were none. Lab Results  Component Value Date   HGBA1C 6.0 (A) 10/29/2023

## 2024-03-23 NOTE — Assessment & Plan Note (Signed)
 Being followed by Nephrology. Labs are stable.  Avoiding nephrotoxic agents

## 2024-03-23 NOTE — Progress Notes (Signed)
 Date:  03/23/2024   Name:  Kendra Walton   DOB:  [birth year]   MRN:  [id number]   Chief Complaint: Annual Exam Kendra Walton is a 73 y.o. female who presents today for her Complete Annual Exam. She feels well. She reports exercising. She reports she is sleeping well. Breast complaints - none.  Health Maintenance  Topic Date Due   Medicare Annual Wellness Visit  10/22/2023   Yearly kidney health urinalysis for diabetes  02/25/2024   Breast Cancer Screening  04/10/2024   COVID-19 Vaccine (5 - 2025-26 season) 04/08/2024*   DTaP/Tdap/Td vaccine (1 - Tdap) 03/23/2025*   Hemoglobin A1C  04/30/2024   Eye exam for diabetics  09/17/2024   Yearly kidney function blood test for diabetes  10/03/2024   Complete foot exam   02/13/2025   Cologuard (Stool DNA test)  01/19/2027   Pneumococcal Vaccine for age over 68  Completed   Flu Shot  Completed   Hepatitis C Screening  Completed   Zoster (Shingles) Vaccine  Completed   Meningitis B Vaccine  Aged Out   Screening for Lung Cancer  Discontinued   DEXA scan (bone density measurement)  Discontinued  *Topic was postponed. The date shown is not the original due date.    Hypertension Pertinent negatives include no chest pain, headaches, palpitations or shortness of breath.  Diabetes Pertinent negatives for hypoglycemia include no dizziness or headaches. Pertinent negatives for diabetes include no chest pain, no fatigue and no weakness.  Hyperlipidemia Pertinent negatives include no chest pain, myalgias or shortness of breath.    Review of Systems  Constitutional:  Negative for fatigue and unexpected weight change.  HENT:  Negative for trouble swallowing.   Eyes:  Negative for visual disturbance.  Respiratory:  Negative for cough, chest tightness, shortness of breath and wheezing.   Cardiovascular:  Negative for chest pain, palpitations and leg swelling.  Gastrointestinal:  Negative for abdominal pain, constipation and  diarrhea.  Musculoskeletal:  Negative for arthralgias and myalgias.  Neurological:  Negative for dizziness, weakness, light-headedness and headaches.     Lab Results  Component Value Date   NA 136 10/04/2023   K 4.8 10/04/2023   CO2 23 10/04/2023   GLUCOSE 118 (H) 10/04/2023   BUN 29 (H) 10/04/2023   CREATININE 1.38 (H) 10/04/2023   CALCIUM  8.5 (L) 10/04/2023   EGFR 36 (L) 07/01/2023   GFRNONAA 40 (L) 10/04/2023   Lab Results  Component Value Date   CHOL 171 02/25/2023   HDL 45 02/25/2023   LDLCALC 91 02/25/2023   TRIG 209 (H) 02/25/2023   CHOLHDL 3.8 02/25/2023   Lab Results  Component Value Date   TSH 3.08 02/05/2022   Lab Results  Component Value Date   HGBA1C 6.0 (A) 10/29/2023   Lab Results  Component Value Date   WBC 10.2 07/01/2023   HGB 9.9 (L) 07/01/2023   HCT 31.8 (L) 07/01/2023   MCV 86 07/01/2023   PLT 269 07/01/2023   Lab Results  Component Value Date   ALT 16 02/25/2023   AST 17 02/25/2023   ALKPHOS 83 02/25/2023   BILITOT 0.3 02/25/2023   No results found for: MARIEN BOLLS, VD25OH   Patient Active Problem List   Diagnosis Date Noted   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) 03/23/2024   Atherosclerosis of native arteries of the extremities with ulceration (HCC) 09/11/2023   Obesity, morbid (HCC) 07/01/2023   Tobacco use disorder, moderate, in sustained  remission 06/22/2022   Chronic kidney disease, stage 3b (HCC) 02/20/2022   S/P TKR (total knee replacement) using cement, right 01/04/2022   Type II diabetes mellitus with complication (HCC) 06/20/2021   Aortic atherosclerosis 01/03/2020   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   Hearing decreased 12/13/2015   Hyperlipidemia associated with type 2 diabetes mellitus (HCC) 09/14/2015   History of breast cancer in female 04/07/2015   Acquired hypothyroidism 10/04/2014   Diabetic peripheral neuropathy (HCC) 10/04/2014   Essential (primary)  hypertension 10/04/2014    Allergies  Allergen Reactions   Shellfish Allergy Hives and Itching    Past Surgical History:  Procedure Laterality Date   ADRENALECTOMY Right 05/28/2022   AMPUTATION TOE Left 10/04/2023   Procedure: AMPUTATION, TOE;  Surgeon: Ashley Soulier, DPM;  Location: [facility];  Service: Orthopedics/Podiatry;  Laterality: Left;   APPENDECTOMY     CARDIAC VALVE REPLACEMENT  2020   Aortic Valve   CATARACT EXTRACTION W/ INTRAOCULAR LENS  IMPLANT, BILATERAL  2010   CESAREAN SECTION  1983   CHOLECYSTECTOMY  2005   ECTOPIC PREGNANCY SURGERY     EYE SURGERY     LOWER EXTREMITY ANGIOGRAPHY Left 09/24/2023   Procedure: Lower Extremity Angiography;  Surgeon: Jama Cordella MATSU, MD;  Location: [facility];  Service: Cardiovascular;  Laterality: Left;   LOWER EXTREMITY INTERVENTION Left 09/24/2023   Procedure: LOWER EXTREMITY INTERVENTION;  Surgeon: Jama Cordella MATSU, MD;  Location: [facility];  Service: Cardiovascular;  Laterality: Left;   LUMBAR LAMINECTOMY/DECOMPRESSION MICRODISCECTOMY Right 11/08/2021   Procedure: RIGHT L5-S1 MICRODISCECTOMY;  Surgeon: Clois Fret, MD;  Location: [facility];  Service: Neurosurgery;  Laterality: Right;   MASTECTOMY, PARTIAL Right 2010   REDUCTION MAMMAPLASTY Left 2011   RIGHT/LEFT HEART CATH AND CORONARY ANGIOGRAPHY Bilateral 08/06/2018   Procedure: RIGHT/LEFT HEART CATH AND CORONARY ANGIOGRAPHY;  Surgeon: Bosie Vinie LABOR, MD;  Location: [facility];  Service: Cardiovascular;  Laterality: Bilateral;   SPINE SURGERY  2023   TOTAL KNEE ARTHROPLASTY Right 01/04/2022   Procedure: TOTAL KNEE ARTHROPLASTY;  Surgeon: Kathlynn Sharper, MD;  Location: [facility];  Service: Orthopedics;  Laterality: Right;   TRANSCATHETER AORTIC VALVE REPLACEMENT, TRANSAPICAL  08/18/2018   Procedure: TRANSCATHETER AORTIC VALVE REPLACEMENT; Location: [facility]; Surgeon: Bernardino Lares, MD   TUBAL LIGATION      Social History   Tobacco Use    Smoking status: Former    Current packs/day: 0.00    Average packs/day: 1 pack/day for 40.0 years (40.0 ttl pk-yrs)    Types: Cigarettes    Start date: 06/18/1972    Quit date: 06/18/2012    Years since quitting: 11.7   Smokeless tobacco: Never   Tobacco comments:    I quit smoking 11 years ago  Vaping Use   Vaping status: Never Used  Substance Use Topics   Alcohol  use: Not Currently   Drug use: No     Medication list has been reviewed and updated.  Current Meds  Medication Sig   aspirin  EC 81 MG tablet Take 1 tablet by mouth daily.   atorvastatin  (LIPITOR) 20 MG tablet Take 1 tablet (20 mg total) by mouth daily.   Blood Glucose Monitoring Suppl DEVI 1 each by Does not apply route in the morning, at noon, and at bedtime. May substitute to any manufacturer covered by patient's insurance.   clobetasol cream (TEMOVATE) 0.05 % Apply 1 Application topically as needed.   clopidogrel  (PLAVIX ) 75 MG tablet Take 1 tablet (75 mg  total) by mouth daily.   Cyanocobalamin  (VITAMIN B-12 PO) Take 1 tablet by mouth daily at 6 (six) AM.   docusate sodium  (COLACE) 100 MG capsule Take 1 capsule (100 mg total) by mouth 2 (two) times daily. (Patient taking differently: Take 100 mg by mouth 2 (two) times daily as needed.)   glucose blood (ONETOUCH ULTRA) test strip USE AS DIRECTED EVERY MORNING, NOON AND EVERY EVENING AS DIRECTED   ketoconazole (NIZORAL) 2 % cream Apply 1 Application topically 2 (two) times daily as needed.   Lancets (ONETOUCH DELICA PLUS LANCET33G) MISC USE AS DIRECTED EVERY MORNING, NOON AND EVERY EVENING   levothyroxine  (SYNTHROID ) 150 MCG tablet Take 75-150 mcg by mouth See admin instructions. Take 150 mcg daily except Sundays take 75 mcg   lisinopril-hydrochlorothiazide (ZESTORETIC) 10-12.5 MG tablet Take 1 tablet by mouth daily.   loratadine (CLARITIN) 10 MG tablet Take 10 mg by mouth every morning.   metFORMIN (GLUCOPHAGE-XR) 500 MG 24 hr tablet TAKE 2 TABLETS(1000 MG) BY MOUTH  TWICE DAILY   Polyvinyl Alcohol-Povidone (REFRESH OP) Place 1 drop into both eyes as needed.   pregabalin (LYRICA) 50 MG capsule Take 2 capsules (100 mg total) by mouth 3 (three) times daily.   senna (SENOKOT) 8.6 MG TABS tablet Take 1 tablet (8.6 mg total) by mouth daily as needed for mild constipation.   [DISCONTINUED] traMADol (ULTRAM) 50 MG tablet Taking 2 in the AM, 2 at midday and 1 at bedtime       03/23/2024   10:00 AM 10/29/2023   11:11 AM 07/01/2023   11:17 AM 02/25/2023    9:25 AM  GAD 7 : Generalized Anxiety Score  Nervous, Anxious, on Edge 0 3 0 1  Control/stop worrying 0 0 0 0  Worry too much - different things 0 0 0 1  Trouble relaxing 0 0 0 0  Restless 0 0 0 0  Easily annoyed or irritable 0 1 0 0  Afraid - awful might happen 0 0 0 1  Total GAD 7 Score 0 4 0 3  Anxiety Difficulty Not difficult at all Not difficult at all Not difficult at all Not difficult at all       10 /11/2023   10:00 AM 10/29/2023   11:11 AM 07/01/2023   11:16 AM  Depression screen PHQ 2/9  Decreased Interest 0 0 1  Down, Depressed, Hopeless 0 0 1  PHQ - 2 Score 0 0 2  Altered sleeping 0 0 0  Tired, decreased energy 0 0 0  Change in appetite 0 1 0  Feeling bad or failure about yourself  0 0 0  Trouble concentrating 0 0 0  Moving slowly or fidgety/restless 0 0 0  Suicidal thoughts 0 0 0  PHQ-9 Score 0 1 2  Difficult doing work/chores Not difficult at all Not difficult at all Not difficult at all    BP Readings from Last 3 Encounters:  03/23/24 124/76  02/25/24 128/70  01/08/24 124/78    Physical Exam Vitals and nursing note reviewed.  Constitutional:      General: She is not in acute distress.    Appearance: She is well-developed.  HENT:     Head: Normocephalic and atraumatic.     Right Ear: Tympanic membrane and ear canal normal.     Left Ear: Tympanic membrane and ear canal normal.     Nose:     Right Sinus: No maxillary sinus tenderness.     Left Sinus: No maxillary sinus  tenderness.  Eyes:     General: No scleral icterus.       Right eye: No discharge.        Left eye: No discharge.     Conjunctiva/sclera: Conjunctivae normal.  Neck:     Thyroid : No thyromegaly.     Vascular: No carotid bruit.  Cardiovascular:     Rate and Rhythm: Normal rate and regular rhythm. Occasional Extrasystoles are present.    Pulses: Normal pulses.     Heart sounds: Normal heart sounds.     Comments: Crisp valve sounds Pulmonary:     Effort: Pulmonary effort is normal. No respiratory distress.     Breath sounds: No wheezing.  Abdominal:     General: Bowel sounds are normal.     Palpations: Abdomen is soft.     Tenderness: There is no abdominal tenderness.  Musculoskeletal:     Cervical back: Normal range of motion. No erythema.     Right lower leg: No edema.     Left lower leg: No edema.  Lymphadenopathy:     Cervical: No cervical adenopathy.  Skin:    General: Skin is warm and dry.     Findings: No rash.  Neurological:     Mental Status: She is alert and oriented to person, place, and time.     Cranial Nerves: No cranial nerve deficit.     Sensory: No sensory deficit.     Deep Tendon Reflexes: Reflexes are normal and symmetric.  Psychiatric:        Attention and Perception: Attention normal.        Mood and Affect: Mood normal.     Wt Readings from Last 3 Encounters:  03/23/24 242 lb (109.8 kg)  02/25/24 245 lb (111.1 kg)  10/29/23 238 lb 4 oz (108.1 kg)    BP 124/76   Pulse (!) 106   Ht 5' 8 (1.727 m)   Wt 242 lb (109.8 kg)   SpO2 93%   BMI 36.80 kg/m   Assessment and Plan:  Problem List Items Addressed This Visit       Unprioritized   Acquired hypothyroidism (Chronic)   Followed by Endo. Last TSH in August = 0.827      Chronic kidney disease, stage 3b [facility])   Being followed by Nephrology. Labs are stable.  Avoiding nephrotoxic agents      Relevant Orders   Comprehensive metabolic panel with GFR   Diabetic peripheral neuropathy  (HCC) (Chronic)   Chronic stable symptoms. Much improved with Lyrica  and Tramadol  which allows her to work part time at [employer].      Relevant Medications   traMADol  (ULTRAM ) 50 MG tablet   Essential (primary) hypertension (Chronic)   Well controlled blood pressure today. Current regimen is lisinopril  and hydrochlorothiazide .. No medication side effects noted.        Relevant Orders   CBC with Differential/Platelet   History of breast cancer in female   Unilateral left screening mammogram ordered      Relevant Orders   MM 3D DIAGNOSTIC MAMMOGRAM UNILATERAL LEFT BREAST   Hyperlipidemia associated with type 2 diabetes mellitus (HCC) (Chronic)   LDL is  Lab Results  Component Value Date   LDLCALC 91 02/25/2023    Current medication regimen is atorvastatin . Goal LDL is < 70.       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      Type II diabetes mellitus with complication (HCC) (Chronic)   Currently medications are MTF.  No hypoglycemic episodes noted. Home blood sugars in the 130 range. Last visit medical regimen changes were none. Lab Results  Component Value Date   HGBA1C 6.0 (A) 10/29/2023          Relevant Orders   Comprehensive metabolic panel with GFR   Hemoglobin A1c   Microalbumin / creatinine urine ratio   Other Visit Diagnoses       Annual physical exam    -  Primary   up to date on screenings and immunizations.     Long term current use of oral hypoglycemic drug         Encounter for immunization       Relevant Orders   Flu vaccine HIGH DOSE PF(Fluzone Trivalent) (Completed)       Return in about 4 months (around 07/24/2024) for TOC , DM, HTN  Dr. Sol.    Leita HILARIO Adie, MD [facility] [place]

## 2024-03-23 NOTE — Patient Instructions (Signed)
 Call [facility] to schedule your mammogram at [phone].

## 2024-03-23 NOTE — Assessment & Plan Note (Signed)
 Well controlled blood pressure today. Current regimen is lisinopril  and hydrochlorothiazide .. No medication side effects noted.

## 2024-03-23 NOTE — Assessment & Plan Note (Signed)
 Unilateral (left) screening mammogram ordered

## 2024-03-23 NOTE — Assessment & Plan Note (Signed)
 Followed by Endo. Last TSH in August = 0.827

## 2024-03-23 NOTE — Assessment & Plan Note (Signed)
 LDL is  Lab Results  Component Value Date   LDLCALC 91 02/25/2023    Current medication regimen is atorvastatin . Goal LDL is < 70.

## 2024-03-23 NOTE — Assessment & Plan Note (Signed)
 Chronic stable symptoms. Much improved with Lyrica  and Tramadol  which allows her to work part time at [employer].

## 2024-03-23 NOTE — Assessment & Plan Note (Signed)
 Most recently had melanoma removed from right hip.

## 2024-03-24 LAB — LIPID PANEL
Chol/HDL Ratio: 3.7 ratio (ref 0.0–4.4)
Cholesterol, Total: 157 mg/dL (ref 100–199)
HDL: 42 mg/dL (ref >39)
LDL Chol Calc (NIH): 77 mg/dL (ref 0–99)
Triglycerides: 233 mg/dL — ABNORMAL HIGH (ref 0–149)
VLDL Cholesterol Cal: 38 mg/dL (ref 5–40)

## 2024-03-24 LAB — HEMOGLOBIN A1C
Est. average glucose Bld gHb Est-mCnc: 134 mg/dL
Hgb A1c MFr Bld: 6.3 % — ABNORMAL HIGH (ref 4.8–5.6)

## 2024-03-24 LAB — COMPREHENSIVE METABOLIC PANEL WITH GFR
ALT: 15 IU/L (ref 0–32)
AST: 14 IU/L (ref 0–40)
Albumin: 4.2 g/dL (ref 3.8–4.8)
Alkaline Phosphatase: 108 IU/L (ref 49–135)
BUN/Creatinine Ratio: 19 (ref 12–28)
BUN: 22 mg/dL (ref 8–27)
Bilirubin Total: 0.2 mg/dL (ref 0.0–1.2)
CO2: 22 mmol/L (ref 20–29)
Calcium: 9.3 mg/dL (ref 8.7–10.3)
Chloride: 105 mmol/L (ref 96–106)
Creatinine, Ser: 1.14 mg/dL — ABNORMAL HIGH (ref 0.57–1.00)
Globulin, Total: 3 g/dL (ref 1.5–4.5)
Glucose: 111 mg/dL — ABNORMAL HIGH (ref 70–99)
Potassium: 5.8 mmol/L — ABNORMAL HIGH (ref 3.5–5.2)
Sodium: 141 mmol/L (ref 134–144)
Total Protein: 7.2 g/dL (ref 6.0–8.5)
eGFR: 50 mL/min/1.73 — ABNORMAL LOW (ref >59)

## 2024-03-24 LAB — CBC WITH DIFFERENTIAL/PLATELET
Basophils Absolute: 0.1 x10E3/uL (ref 0.0–0.2)
Basos: 1 %
EOS (ABSOLUTE): 0.2 x10E3/uL (ref 0.0–0.4)
Eos: 3 %
Hematocrit: 32.6 % — ABNORMAL LOW (ref 34.0–46.6)
Hemoglobin: 10.2 g/dL — ABNORMAL LOW (ref 11.1–15.9)
Immature Grans (Abs): 0 x10E3/uL (ref 0.0–0.1)
Immature Granulocytes: 0 %
Lymphocytes Absolute: 2.5 x10E3/uL (ref 0.7–3.1)
Lymphs: 27 %
MCH: 26 pg — ABNORMAL LOW (ref 26.6–33.0)
MCHC: 31.3 g/dL — ABNORMAL LOW (ref 31.5–35.7)
MCV: 83 fL (ref 79–97)
Monocytes Absolute: 0.8 x10E3/uL (ref 0.1–0.9)
Monocytes: 9 %
Neutrophils Absolute: 5.7 x10E3/uL (ref 1.4–7.0)
Neutrophils: 60 %
Platelets: 270 x10E3/uL (ref 150–450)
RBC: 3.93 x10E6/uL (ref 3.77–5.28)
RDW: 14.1 % (ref 11.7–15.4)
WBC: 9.4 x10E3/uL (ref 3.4–10.8)

## 2024-03-24 LAB — MICROALBUMIN / CREATININE URINE RATIO
Creatinine, Urine: 134.1 mg/dL
Microalb/Creat Ratio: 14 mg/g{creat} (ref 0–29)
Microalbumin, Urine: 19.2 ug/mL

## 2024-03-25 ENCOUNTER — Ambulatory Visit: Payer: Self-pay | Admitting: Internal Medicine

## 2024-04-06 DIAGNOSIS — M48062 Spinal stenosis, lumbar region with neurogenic claudication: Secondary | ICD-10-CM | POA: Diagnosis not present

## 2024-04-06 DIAGNOSIS — M5416 Radiculopathy, lumbar region: Secondary | ICD-10-CM | POA: Diagnosis not present

## 2024-04-23 ENCOUNTER — Other Ambulatory Visit (INDEPENDENT_AMBULATORY_CARE_PROVIDER_SITE_OTHER): Payer: Self-pay | Admitting: Vascular Surgery

## 2024-04-23 DIAGNOSIS — Z9889 Other specified postprocedural states: Secondary | ICD-10-CM

## 2024-04-24 ENCOUNTER — Other Ambulatory Visit: Payer: Self-pay | Admitting: Internal Medicine

## 2024-04-24 DIAGNOSIS — E1142 Type 2 diabetes mellitus with diabetic polyneuropathy: Secondary | ICD-10-CM

## 2024-04-24 MED ORDER — TRAMADOL HCL 50 MG PO TABS: ORAL_TABLET | ORAL | 0 refills | Status: DC

## 2024-04-24 NOTE — Progress Notes (Unsigned)
 Date:  04/24/2024   Name:  Kendra Walton   DOB:  December 08, 1950   MRN:  [id number]   Chief Complaint: No chief complaint on file.  HPI  Review of Systems   Lab Results  Component Value Date   NA 141 03/23/2024   K 5.8 (H) 03/23/2024   CO2 22 03/23/2024   GLUCOSE 111 (H) 03/23/2024   BUN 22 03/23/2024   CREATININE 1.14 (H) 03/23/2024   CALCIUM  9.3 03/23/2024   EGFR 50 (L) 03/23/2024   GFRNONAA 40 (L) 10/04/2023   Lab Results  Component Value Date   CHOL 157 03/23/2024   HDL 42 03/23/2024   LDLCALC 77 03/23/2024   TRIG 233 (H) 03/23/2024   CHOLHDL 3.7 03/23/2024   Lab Results  Component Value Date   TSH 3.08 02/05/2022   Lab Results  Component Value Date   HGBA1C 6.3 (H) 03/23/2024   Lab Results  Component Value Date   WBC 9.4 03/23/2024   HGB 10.2 (L) 03/23/2024   HCT 32.6 (L) 03/23/2024   MCV 83 03/23/2024   PLT 270 03/23/2024   Lab Results  Component Value Date   ALT 15 03/23/2024   AST 14 03/23/2024   ALKPHOS 108 03/23/2024   BILITOT 0.2 03/23/2024   No results found for: MARIEN BOLLS, VD25OH   Patient Active Problem List   Diagnosis Date Noted   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) 03/23/2024   Atherosclerosis of native arteries of the extremities with ulceration (HCC) 09/11/2023   Obesity, morbid (HCC) 07/01/2023   Tobacco use disorder, moderate, in sustained remission 06/22/2022   Chronic kidney disease, stage 3b (HCC) 02/20/2022   S/P TKR (total knee replacement) using cement, right 01/04/2022   Type II diabetes mellitus with complication (HCC) 06/20/2021   Aortic atherosclerosis 01/03/2020   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   Hearing decreased 12/13/2015   Hyperlipidemia associated with type 2 diabetes mellitus (HCC) 09/14/2015   History of breast cancer in female 04/07/2015   Acquired hypothyroidism 10/04/2014   Diabetic peripheral neuropathy (HCC) 10/04/2014   Essential  (primary) hypertension 10/04/2014    Allergies  Allergen Reactions   Shellfish Allergy Hives and Itching    Past Surgical History:  Procedure Laterality Date   ADRENALECTOMY Right 05/28/2022   AMPUTATION TOE Left 10/04/2023   Procedure: AMPUTATION, TOE;  Surgeon: Ashley Soulier, DPM;  Location: [facility];  Service: Orthopedics/Podiatry;  Laterality: Left;   APPENDECTOMY     CARDIAC VALVE REPLACEMENT  2020   Aortic Valve   CATARACT EXTRACTION W/ INTRAOCULAR LENS  IMPLANT, BILATERAL  2010   CESAREAN SECTION  1983   CHOLECYSTECTOMY  2005   ECTOPIC PREGNANCY SURGERY     EYE SURGERY     LOWER EXTREMITY ANGIOGRAPHY Left 09/24/2023   Procedure: Lower Extremity Angiography;  Surgeon: Jama Cordella MATSU, MD;  Location: [facility];  Service: Cardiovascular;  Laterality: Left;   LOWER EXTREMITY INTERVENTION Left 09/24/2023   Procedure: LOWER EXTREMITY INTERVENTION;  Surgeon: Jama Cordella MATSU, MD;  Location: [facility];  Service: Cardiovascular;  Laterality: Left;   LUMBAR LAMINECTOMY/DECOMPRESSION MICRODISCECTOMY Right 11/08/2021   Procedure: RIGHT L5-S1 MICRODISCECTOMY;  Surgeon: Clois Fret, MD;  Location: [facility];  Service: Neurosurgery;  Laterality: Right;   MASTECTOMY, PARTIAL Right 2010   REDUCTION MAMMAPLASTY Left 2011   RIGHT/LEFT HEART CATH AND CORONARY ANGIOGRAPHY Bilateral 08/06/2018   Procedure: RIGHT/LEFT HEART CATH AND CORONARY ANGIOGRAPHY;  Surgeon: Bosie Kent  A, MD;  Location: [facility];  Service: Cardiovascular;  Laterality: Bilateral;   SPINE SURGERY  2023   TOTAL KNEE ARTHROPLASTY Right 01/04/2022   Procedure: TOTAL KNEE ARTHROPLASTY;  Surgeon: Kathlynn Sharper, MD;  Location: [facility];  Service: Orthopedics;  Laterality: Right;   TRANSCATHETER AORTIC VALVE REPLACEMENT, TRANSAPICAL  08/18/2018   Procedure: TRANSCATHETER AORTIC VALVE REPLACEMENT; Location: [facility]; Surgeon: Bernardino Lares, MD   TUBAL LIGATION      Social History    Tobacco Use   Smoking status: Former    Current packs/day: 0.00    Average packs/day: 1 pack/day for 40.0 years (40.0 ttl pk-yrs)    Types: Cigarettes    Start date: 06/18/1972    Quit date: 06/18/2012    Years since quitting: 11.8   Smokeless tobacco: Never   Tobacco comments:    I quit smoking 11 years ago  Vaping Use   Vaping status: Never Used  Substance Use Topics   Alcohol  use: Not Currently   Drug use: No     Medication list has been reviewed and updated.  No outpatient medications have been marked as taking for the 04/24/24 encounter (Orders Only) with Justus Leita DEL, MD.       03/23/2024   10:00 AM 10/29/2023   11:11 AM 07/01/2023   11:17 AM 02/25/2023    9:25 AM  GAD 7 : Generalized Anxiety Score  Nervous, Anxious, on Edge 0 3 0 1  Control/stop worrying 0 0 0 0  Worry too much - different things 0 0 0 1  Trouble relaxing 0 0 0 0  Restless 0 0 0 0  Easily annoyed or irritable 0 1 0 0  Afraid - awful might happen 0 0 0 1  Total GAD 7 Score 0 4 0 3  Anxiety Difficulty Not difficult at all Not difficult at all Not difficult at all Not difficult at all       03/23/2024   10:00 AM 10/29/2023   11:11 AM 07/01/2023   11:16 AM  Depression screen PHQ 2/9  Decreased Interest 0 0 1  Down, Depressed, Hopeless 0 0 1  PHQ - 2 Score 0 0 2  Altered sleeping 0 0 0  Tired, decreased energy 0 0 0  Change in appetite 0 1 0  Feeling bad or failure about yourself  0 0 0  Trouble concentrating 0 0 0  Moving slowly or fidgety/restless 0 0 0  Suicidal thoughts 0 0 0  PHQ-9 Score 0  1  2   Difficult doing work/chores Not difficult at all Not difficult at all Not difficult at all     Data saved with a previous flowsheet row definition    BP Readings from Last 3 Encounters:  03/23/24 124/76  02/25/24 128/70  01/08/24 124/78    Physical Exam  Wt Readings from Last 3 Encounters:  03/23/24 242 lb (109.8 kg)  02/25/24 245 lb (111.1 kg)  10/29/23 238 lb 4 oz (108.1 kg)     There were no vitals taken for this visit.  Assessment and Plan:  Problem List Items Addressed This Visit   None   No follow-ups on file.    Leita HILARIO Justus, MD [facility] [place]

## 2024-04-24 NOTE — Telephone Encounter (Signed)
 Please review.  KP

## 2024-04-27 ENCOUNTER — Encounter (INDEPENDENT_AMBULATORY_CARE_PROVIDER_SITE_OTHER): Payer: Self-pay | Admitting: Vascular Surgery

## 2024-04-27 ENCOUNTER — Ambulatory Visit (INDEPENDENT_AMBULATORY_CARE_PROVIDER_SITE_OTHER)

## 2024-04-27 ENCOUNTER — Ambulatory Visit (INDEPENDENT_AMBULATORY_CARE_PROVIDER_SITE_OTHER): Admitting: Vascular Surgery

## 2024-04-27 VITALS — BP 143/75 | HR 50 | Resp 18 | Wt 248.4 lb

## 2024-04-27 DIAGNOSIS — I10 Essential (primary) hypertension: Secondary | ICD-10-CM

## 2024-04-27 DIAGNOSIS — I739 Peripheral vascular disease, unspecified: Secondary | ICD-10-CM

## 2024-04-27 DIAGNOSIS — E118 Type 2 diabetes mellitus with unspecified complications: Secondary | ICD-10-CM | POA: Diagnosis not present

## 2024-04-27 DIAGNOSIS — Z9889 Other specified postprocedural states: Secondary | ICD-10-CM

## 2024-04-27 DIAGNOSIS — I7025 Atherosclerosis of native arteries of other extremities with ulceration: Secondary | ICD-10-CM

## 2024-04-27 DIAGNOSIS — E785 Hyperlipidemia, unspecified: Secondary | ICD-10-CM

## 2024-04-27 DIAGNOSIS — E1169 Type 2 diabetes mellitus with other specified complication: Secondary | ICD-10-CM

## 2024-04-27 NOTE — Progress Notes (Signed)
 MRN : [id number]  Kendra Walton is a 73 y.o. ([birth year]) female who presents with chief complaint of check circulation.  History of Present Illness:   The patient returns to the office for followup and review status post angiogram with intervention on 09/24/2023.   Procedure:  Thrombectomy left SFA with the Rota Rex catheter.   2.    Percutaneous transluminal angioplasty and stent placement left superficial femoral artery  The patient notes improvement in the lower extremity symptoms. No interval shortening of the patient's claudication distance or rest pain symptoms.  The toe is not completely healed but seems to be doing well.  No new ulcers or wounds have occurred since the last visit.  There have been no significant changes to the patient's overall health care.  No documented history of amaurosis fugax or recent TIA symptoms. There are no recent neurological changes noted. No documented history of DVT, PE or superficial thrombophlebitis. The patient denies recent episodes of angina or shortness of breath.   ABI's Rt= 0.98 and Lt= 1.16 (previous ABI's Rt= 0.96 and Lt= 1.11).  Noninvasive studies appear to be stable compared to the previous exam  No outpatient medications have been marked as taking for the 04/27/24 encounter (Appointment) with Jama, Cordella MATSU, MD.    Past Medical History:  Diagnosis Date   Absolute anemia 09/15/2015   Patient declines colonoscopy    Acute on chronic systolic (congestive) heart failure (HCC) 03/23/2024   Allergy    Shellfish   Aortic stenosis, moderate 04/05/2017   a.) s/p TAVR 08/18/2018; LEFT axillary approach   Arthritis    Breast cancer, right (HCC) 2010   a.) s/p RIGHT mastectomy; no chemotherpay or XRT   CAD (coronary artery disease) 08/06/2018   a.) R/LHC 08/06/2018: EF 35%; 20% stenosis oRI; severe AS (MPG 48.2 mmHg; AVA (VTI) = 0.58 cm); mean PA =  36 mmHg, mean PCWP = 27 mmHg, PVR 1.78 WU, CO 5.06 L/min, CI 2.25 L/min/m   CHF (congestive heart failure) (HCC)    a.)  TTE 07/29/2018: EF 35%; moderate global HK; moderate LVH; mild LA dilation; trivial TR/PR, mild AR/MR; moderate AS (MPG 48.2 mmHg); G2DD. b.) R/LHC 08/06/2018; EF 35%; mean PCWP 27 mmHg. c.)  TTE 10/20/2019: EF >55%; mild LVH; trivial MR/TR, mild PR, prostatic AoV. d.)  TTE 08/17/2021: EF >55%; mild LVH; mild LA dilation; trivial MR/TR/PR; prostatic AoV (MPG 12 mmHg); G1DD.   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 b.) CT abd 02/06/2019: measured 4.7 x 3.7 cm. c.) MRI lumbar spine  10/25/2021: interval increase to 7.1 x 7.1 cm.   T2DM (type 2 diabetes mellitus) [facility])     Past Surgical History:  Procedure Laterality Date   ADRENALECTOMY Right 05/28/2022   AMPUTATION TOE Left 10/04/2023   Procedure: AMPUTATION, TOE;  Surgeon: Ashley Soulier, DPM;  Location: [facility];  Service: Orthopedics/Podiatry;  Laterality: Left;   APPENDECTOMY     CARDIAC VALVE REPLACEMENT  2020   Aortic Valve   CATARACT EXTRACTION W/ INTRAOCULAR LENS  IMPLANT, BILATERAL  2010   CESAREAN SECTION  1983   CHOLECYSTECTOMY  2005   ECTOPIC PREGNANCY SURGERY     EYE SURGERY     LOWER EXTREMITY ANGIOGRAPHY Left 09/24/2023   Procedure: Lower Extremity Angiography;  Surgeon: Jama Cordella MATSU, MD;  Location: [facility];  Service: Cardiovascular;  Laterality: Left;   LOWER EXTREMITY  INTERVENTION Left 09/24/2023   Procedure: LOWER EXTREMITY INTERVENTION;  Surgeon: Jama Cordella MATSU, MD;  Location: [facility];  Service: Cardiovascular;  Laterality: Left;   LUMBAR LAMINECTOMY/DECOMPRESSION MICRODISCECTOMY Right 11/08/2021   Procedure: RIGHT L5-S1 MICRODISCECTOMY;  Surgeon: Clois Fret, MD;  Location: [facility];  Service: Neurosurgery;  Laterality: Right;   MASTECTOMY, PARTIAL Right 2010   REDUCTION MAMMAPLASTY Left 2011   RIGHT/LEFT HEART CATH AND CORONARY ANGIOGRAPHY Bilateral 08/06/2018   Procedure: RIGHT/LEFT HEART CATH AND CORONARY ANGIOGRAPHY;  Surgeon: Bosie Vinie LABOR, MD;  Location: [facility];  Service: Cardiovascular;  Laterality: Bilateral;   SPINE SURGERY  2023   TOTAL KNEE ARTHROPLASTY Right 01/04/2022   Procedure: TOTAL KNEE ARTHROPLASTY;  Surgeon: Kathlynn Sharper, MD;  Location: [facility];  Service: Orthopedics;  Laterality: Right;   TRANSCATHETER AORTIC VALVE REPLACEMENT, TRANSAPICAL  08/18/2018   Procedure: TRANSCATHETER AORTIC VALVE REPLACEMENT; Location: [facility]; Surgeon: Bernardino Lares, MD   TUBAL LIGATION      Social History Social History   Tobacco Use   Smoking status: Former    Current packs/day: 0.00    Average packs/day: 1 pack/day for 40.0 years (40.0 ttl pk-yrs)    Types: Cigarettes    Start date: 06/18/1972    Quit date: 06/18/2012    Years since quitting: 11.8   Smokeless tobacco: Never   Tobacco comments:    I quit smoking 11 years ago  Vaping Use   Vaping status: Never Used  Substance Use Topics   Alcohol  use: Not Currently   Drug use: No    Family History Family History  Problem Relation Age of Onset   Alcohol  abuse Father    Arthritis Father    Hypertension Brother    Diabetes Brother    Early death Brother    Vision loss Brother    Drug abuse Brother    ADD / ADHD Son    Breast cancer Neg Hx     Allergies  Allergen Reactions   Shellfish Allergy Hives and Itching     REVIEW OF SYSTEMS (Negative  unless checked)  Constitutional: [] Weight loss  [] Fever  [] Chills Cardiac: [] Chest pain   [] Chest pressure   [] Palpitations   [] Shortness of breath when laying flat   [] Shortness of breath with exertion. Vascular:  [x] Pain in legs with walking   [] Pain in legs at rest  [] History of DVT   [] Phlebitis   [] Swelling in legs   [] Varicose veins   [] Non-healing ulcers Pulmonary:   [] Uses home oxygen   [] Productive cough   [] Hemoptysis   [] Wheeze  [] COPD   [] Asthma Neurologic:  [] Dizziness   [] Seizures   []   History of stroke   [] History of TIA  [] Aphasia   [] Vissual changes   [] Weakness or numbness in arm   [] Weakness or numbness in leg Musculoskeletal:   [] Joint swelling   [] Joint pain   [] Low back pain Hematologic:  [] Easy bruising  [] Easy bleeding   [] Hypercoagulable state   [] Anemic Gastrointestinal:  [] Diarrhea   [] Vomiting  [] Gastroesophageal reflux/heartburn   [] Difficulty swallowing. Genitourinary:  [] Chronic kidney disease   [] Difficult urination  [] Frequent urination   [] Blood in urine Skin:  [] Rashes   [] Ulcers  Psychological:  [] History of anxiety   []  History of major depression.  Physical Examination  There were no vitals filed for this visit. There is no height or weight on file to calculate BMI. Gen: WD/WN, NAD Head: [place]/AT, No temporalis wasting.  Ear/Nose/Throat: Hearing grossly intact, nares w/o erythema or drainage Eyes: PER, EOMI, sclera nonicteric.  Neck: Supple, no masses.  No bruit or JVD.  Pulmonary:  Good air movement, no audible wheezing, no use of accessory muscles.  Cardiac: RRR, normal S1, S2, no Murmurs. Vascular:  mild trophic changes,  Vessel Right Left  Radial Palpable Palpable  PT Not Palpable Not Palpable  DP Not Palpable Not Palpable  Gastrointestinal: soft, non-distended. No guarding/no peritoneal signs.  Musculoskeletal: M/S 5/5 throughout.  No visible deformity.  Neurologic: CN 2-12 intact. Pain and light touch intact in extremities.  Symmetrical.  Speech  is fluent. Motor exam as listed above. Psychiatric: Judgment intact, Mood & affect appropriate for pt's clinical situation. Dermatologic: No rashes or ulcers noted.  No changes consistent with cellulitis.   CBC Lab Results  Component Value Date   WBC 9.4 03/23/2024   HGB 10.2 (L) 03/23/2024   HCT 32.6 (L) 03/23/2024   MCV 83 03/23/2024   PLT 270 03/23/2024    BMET    Component Value Date/Time   NA 141 03/23/2024 1043   K 5.8 (H) 03/23/2024 1043   CL 105 03/23/2024 1043   CO2 22 03/23/2024 1043   GLUCOSE 111 (H) 03/23/2024 1043   GLUCOSE 118 (H) 10/04/2023 0955   BUN 22 03/23/2024 1043   CREATININE 1.14 (H) 03/23/2024 1043   CALCIUM  9.3 03/23/2024 1043   GFRNONAA 40 (L) 10/04/2023 0955   GFRAA 65 05/09/2020 0925   CrCl cannot be calculated (Patient's most recent lab result is older than the maximum 21 days allowed.).  COAG No results found for: INR, PROTIME  Radiology No results found.   Assessment/Plan 1. Atherosclerosis of native arteries of the extremities with ulceration (HCC) (Primary)  Recommend:  The patient has evidence of atherosclerosis of the lower extremities with claudication.  The patient does not voice lifestyle limiting changes at this point in time.    Noninvasive studies do not suggest clinically significant change.  And she should have adequate perfusion for wound healing  No invasive studies, angiography or surgery at this time The patient should continue walking and begin a more formal exercise program.  The patient should continue antiplatelet therapy and aggressive treatment of the lipid abnormalities  No changes in the patient's medications at this time  Continued surveillance is indicated as atherosclerosis is likely to progress with time.    The patient will continue follow up with noninvasive studies as ordered.  - VAS US  ABI WITH/WO TBI; Future  2. Essential (primary) hypertension Continue antihypertensive medications as  already ordered, these medications have been reviewed and there are no changes at this time.  3. Hyperlipidemia associated with type 2 diabetes  mellitus (HCC) Continue statin as ordered and reviewed, no changes at this time  4. Type II diabetes mellitus with complication (HCC) Continue hypoglycemic medications as already ordered, these medications have been reviewed and there are no changes at this time.  Hgb A1C to be monitored as already arranged by primary service d   Cordella Shawl, MD  04/27/2024 9:07 AM

## 2024-04-28 ENCOUNTER — Other Ambulatory Visit: Payer: Self-pay | Admitting: Internal Medicine

## 2024-04-28 DIAGNOSIS — E1142 Type 2 diabetes mellitus with diabetic polyneuropathy: Secondary | ICD-10-CM

## 2024-04-29 LAB — VAS US ABI WITH/WO TBI
Left ABI: 1.16
Right ABI: 0.98

## 2024-04-30 NOTE — Telephone Encounter (Signed)
 Requested medication (s) are due for refill today: yes  Requested medication (s) are on the active medication list: yes  Last refill:  04/24/24  Future visit scheduled: yes  Notes to clinic:  Unable to refill per protocol, cannot delegate.      Requested Prescriptions  Pending Prescriptions Disp Refills   traMADol  (ULTRAM ) 50 MG tablet [Pharmacy Med Name: TRAMADOL  50MG  TABLETS] 150 tablet     Sig: TAKE 2 TABLETS BY MOUTH EVERY MORNING, 2 MIDDAY, AND 1 AT BEDTIME     Not Delegated - Analgesics:  Opioid Agonists Failed - 04/30/2024 11:01 AM      Failed - This refill cannot be delegated      Failed - Urine Drug Screen completed in last 360 days      Passed - Valid encounter within last 3 months    Recent Outpatient Visits           1 month ago Annual physical exam   [place] [facility] at [facility], Leita DEL, MD   3 months ago Acute cough   [place] [facility] at [facility], Leita DEL, MD   6 months ago Essential (primary) hypertension   [facility] at [facility], Leita DEL, MD

## 2024-04-30 NOTE — Telephone Encounter (Signed)
 Please review.  KP

## 2024-05-16 ENCOUNTER — Encounter: Payer: Self-pay | Admitting: Internal Medicine

## 2024-05-18 ENCOUNTER — Other Ambulatory Visit: Payer: Self-pay | Admitting: Internal Medicine

## 2024-05-18 DIAGNOSIS — E1142 Type 2 diabetes mellitus with diabetic polyneuropathy: Secondary | ICD-10-CM

## 2024-05-18 MED ORDER — PREGABALIN 50 MG PO CAPS: 100.0000 mg | ORAL_CAPSULE | Freq: Three times a day (TID) | ORAL | 0 refills | Status: AC

## 2024-05-18 NOTE — Progress Notes (Unsigned)
 Date:  05/18/2024   Name:  Kendra Walton   DOB:  26-Sep-1950   MRN:  [id number]   Chief Complaint: No chief complaint on file.  HPI  Review of Systems   Lab Results  Component Value Date   NA 141 03/23/2024   K 5.8 (H) 03/23/2024   CO2 22 03/23/2024   GLUCOSE 111 (H) 03/23/2024   BUN 22 03/23/2024   CREATININE 1.14 (H) 03/23/2024   CALCIUM  9.3 03/23/2024   EGFR 50 (L) 03/23/2024   GFRNONAA 40 (L) 10/04/2023   Lab Results  Component Value Date   CHOL 157 03/23/2024   HDL 42 03/23/2024   LDLCALC 77 03/23/2024   TRIG 233 (H) 03/23/2024   CHOLHDL 3.7 03/23/2024   Lab Results  Component Value Date   TSH 3.08 02/05/2022   Lab Results  Component Value Date   HGBA1C 6.3 (H) 03/23/2024   Lab Results  Component Value Date   WBC 9.4 03/23/2024   HGB 10.2 (L) 03/23/2024   HCT 32.6 (L) 03/23/2024   MCV 83 03/23/2024   PLT 270 03/23/2024   Lab Results  Component Value Date   ALT 15 03/23/2024   AST 14 03/23/2024   ALKPHOS 108 03/23/2024   BILITOT 0.2 03/23/2024   No results found for: MARIEN BOLLS, VD25OH   Patient Active Problem List   Diagnosis Date Noted   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) 03/23/2024   Atherosclerosis of native arteries of the extremities with ulceration (HCC) 09/11/2023   Obesity, morbid (HCC) 07/01/2023   Tobacco use disorder, moderate, in sustained remission 06/22/2022   Chronic kidney disease, stage 3b (HCC) 02/20/2022   S/P TKR (total knee replacement) using cement, right 01/04/2022   Type II diabetes mellitus with complication (HCC) 06/20/2021   Aortic atherosclerosis 01/03/2020   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   Hearing decreased 12/13/2015   Hyperlipidemia associated with type 2 diabetes mellitus (HCC) 09/14/2015   History of breast cancer in female 04/07/2015   Acquired hypothyroidism 10/04/2014   Diabetic peripheral neuropathy (HCC) 10/04/2014   Essential  (primary) hypertension 10/04/2014    Allergies  Allergen Reactions   Shellfish Allergy Hives and Itching    Past Surgical History:  Procedure Laterality Date   ADRENALECTOMY Right 05/28/2022   AMPUTATION TOE Left 10/04/2023   Procedure: AMPUTATION, TOE;  Surgeon: Ashley Soulier, DPM;  Location: [facility];  Service: Orthopedics/Podiatry;  Laterality: Left;   APPENDECTOMY     CARDIAC VALVE REPLACEMENT  2020   Aortic Valve   CATARACT EXTRACTION W/ INTRAOCULAR LENS  IMPLANT, BILATERAL  2010   CESAREAN SECTION  1983   CHOLECYSTECTOMY  2005   ECTOPIC PREGNANCY SURGERY     EYE SURGERY     LOWER EXTREMITY ANGIOGRAPHY Left 09/24/2023   Procedure: Lower Extremity Angiography;  Surgeon: Jama Cordella MATSU, MD;  Location: [facility];  Service: Cardiovascular;  Laterality: Left;   LOWER EXTREMITY INTERVENTION Left 09/24/2023   Procedure: LOWER EXTREMITY INTERVENTION;  Surgeon: Jama Cordella MATSU, MD;  Location: [facility];  Service: Cardiovascular;  Laterality: Left;   LUMBAR LAMINECTOMY/DECOMPRESSION MICRODISCECTOMY Right 11/08/2021   Procedure: RIGHT L5-S1 MICRODISCECTOMY;  Surgeon: Clois Fret, MD;  Location: [facility];  Service: Neurosurgery;  Laterality: Right;   MASTECTOMY, PARTIAL Right 2010   REDUCTION MAMMAPLASTY Left 2011   RIGHT/LEFT HEART CATH AND CORONARY ANGIOGRAPHY Bilateral 08/06/2018   Procedure: RIGHT/LEFT HEART CATH AND CORONARY ANGIOGRAPHY;  Surgeon: Bosie Kent  A, MD;  Location: [facility];  Service: Cardiovascular;  Laterality: Bilateral;   SPINE SURGERY  2023   TOTAL KNEE ARTHROPLASTY Right 01/04/2022   Procedure: TOTAL KNEE ARTHROPLASTY;  Surgeon: Kathlynn Sharper, MD;  Location: [facility];  Service: Orthopedics;  Laterality: Right;   TRANSCATHETER AORTIC VALVE REPLACEMENT, TRANSAPICAL  08/18/2018   Procedure: TRANSCATHETER AORTIC VALVE REPLACEMENT; Location: [facility]; Surgeon: Bernardino Lares, MD   TUBAL LIGATION      Social History    Tobacco Use   Smoking status: Former    Current packs/day: 0.00    Average packs/day: 1 pack/day for 40.0 years (40.0 ttl pk-yrs)    Types: Cigarettes    Start date: 06/18/1972    Quit date: 06/18/2012    Years since quitting: 11.9   Smokeless tobacco: Never   Tobacco comments:    I quit smoking 11 years ago  Vaping Use   Vaping status: Never Used  Substance Use Topics   Alcohol  use: Not Currently   Drug use: No     Medication list has been reviewed and updated.  No outpatient medications have been marked as taking for the 05/18/24 encounter (Orders Only) with Justus Leita DEL, MD.       03/23/2024   10:00 AM 10/29/2023   11:11 AM 07/01/2023   11:17 AM 02/25/2023    9:25 AM  GAD 7 : Generalized Anxiety Score  Nervous, Anxious, on Edge 0 3 0 1  Control/stop worrying 0 0 0 0  Worry too much - different things 0 0 0 1  Trouble relaxing 0 0 0 0  Restless 0 0 0 0  Easily annoyed or irritable 0 1 0 0  Afraid - awful might happen 0 0 0 1  Total GAD 7 Score 0 4 0 3  Anxiety Difficulty Not difficult at all Not difficult at all Not difficult at all Not difficult at all       03/23/2024   10:00 AM 10/29/2023   11:11 AM 07/01/2023   11:16 AM  Depression screen PHQ 2/9  Decreased Interest 0 0 1  Down, Depressed, Hopeless 0 0 1  PHQ - 2 Score 0 0 2  Altered sleeping 0 0 0  Tired, decreased energy 0 0 0  Change in appetite 0 1 0  Feeling bad or failure about yourself  0 0 0  Trouble concentrating 0 0 0  Moving slowly or fidgety/restless 0 0 0  Suicidal thoughts 0 0 0  PHQ-9 Score 0  1  2   Difficult doing work/chores Not difficult at all Not difficult at all Not difficult at all     Data saved with a previous flowsheet row definition    BP Readings from Last 3 Encounters:  04/27/24 (!) 143/75  03/23/24 124/76  02/25/24 128/70    Physical Exam  Wt Readings from Last 3 Encounters:  04/27/24 248 lb 6.4 oz (112.7 kg)  03/23/24 242 lb (109.8 kg)  02/25/24 245 lb (111.1  kg)    There were no vitals taken for this visit.  Assessment and Plan:  Problem List Items Addressed This Visit       Unprioritized   Diabetic peripheral neuropathy (HCC) (Chronic)   Relevant Medications   pregabalin  (LYRICA ) 50 MG capsule    No follow-ups on file.    Leita HILARIO Justus, MD [facility] [place]

## 2024-05-18 NOTE — Telephone Encounter (Signed)
 Please review.  KP

## 2024-05-22 ENCOUNTER — Other Ambulatory Visit: Payer: Self-pay | Admitting: Internal Medicine

## 2024-05-22 DIAGNOSIS — E1142 Type 2 diabetes mellitus with diabetic polyneuropathy: Secondary | ICD-10-CM

## 2024-05-22 MED ORDER — TRAMADOL HCL 50 MG PO TABS: ORAL_TABLET | ORAL | 2 refills | Status: AC

## 2024-05-22 NOTE — Telephone Encounter (Signed)
 Please review.  KP

## 2024-05-22 NOTE — Progress Notes (Unsigned)
 Date:  05/22/2024   Name:  Geneve Kimpel Marston   DOB:  1950/12/29   MRN:  [id number]   Chief Complaint: No chief complaint on file.  HPI  Review of Systems   Lab Results  Component Value Date   NA 141 03/23/2024   K 5.8 (H) 03/23/2024   CO2 22 03/23/2024   GLUCOSE 111 (H) 03/23/2024   BUN 22 03/23/2024   CREATININE 1.14 (H) 03/23/2024   CALCIUM  9.3 03/23/2024   EGFR 50 (L) 03/23/2024   GFRNONAA 40 (L) 10/04/2023   Lab Results  Component Value Date   CHOL 157 03/23/2024   HDL 42 03/23/2024   LDLCALC 77 03/23/2024   TRIG 233 (H) 03/23/2024   CHOLHDL 3.7 03/23/2024   Lab Results  Component Value Date   TSH 3.08 02/05/2022   Lab Results  Component Value Date   HGBA1C 6.3 (H) 03/23/2024   Lab Results  Component Value Date   WBC 9.4 03/23/2024   HGB 10.2 (L) 03/23/2024   HCT 32.6 (L) 03/23/2024   MCV 83 03/23/2024   PLT 270 03/23/2024   Lab Results  Component Value Date   ALT 15 03/23/2024   AST 14 03/23/2024   ALKPHOS 108 03/23/2024   BILITOT 0.2 03/23/2024   No results found for: MARIEN BOLLS, VD25OH   Patient Active Problem List   Diagnosis Date Noted   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) 03/23/2024   Atherosclerosis of native arteries of the extremities with ulceration (HCC) 09/11/2023   Obesity, morbid (HCC) 07/01/2023   Tobacco use disorder, moderate, in sustained remission 06/22/2022   Chronic kidney disease, stage 3b (HCC) 02/20/2022   S/P TKR (total knee replacement) using cement, right 01/04/2022   Type II diabetes mellitus with complication (HCC) 06/20/2021   Aortic atherosclerosis 01/03/2020   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   Hearing decreased 12/13/2015   Hyperlipidemia associated with type 2 diabetes mellitus (HCC) 09/14/2015   History of breast cancer in female 04/07/2015   Acquired hypothyroidism 10/04/2014   Diabetic peripheral neuropathy (HCC) 10/04/2014   Essential  (primary) hypertension 10/04/2014    Allergies  Allergen Reactions   Shellfish Allergy Hives and Itching    Past Surgical History:  Procedure Laterality Date   ADRENALECTOMY Right 05/28/2022   AMPUTATION TOE Left 10/04/2023   Procedure: AMPUTATION, TOE;  Surgeon: Ashley Soulier, DPM;  Location: [facility];  Service: Orthopedics/Podiatry;  Laterality: Left;   APPENDECTOMY     CARDIAC VALVE REPLACEMENT  2020   Aortic Valve   CATARACT EXTRACTION W/ INTRAOCULAR LENS  IMPLANT, BILATERAL  2010   CESAREAN SECTION  1983   CHOLECYSTECTOMY  2005   ECTOPIC PREGNANCY SURGERY     EYE SURGERY     LOWER EXTREMITY ANGIOGRAPHY Left 09/24/2023   Procedure: Lower Extremity Angiography;  Surgeon: Jama Cordella MATSU, MD;  Location: [facility];  Service: Cardiovascular;  Laterality: Left;   LOWER EXTREMITY INTERVENTION Left 09/24/2023   Procedure: LOWER EXTREMITY INTERVENTION;  Surgeon: Jama Cordella MATSU, MD;  Location: [facility];  Service: Cardiovascular;  Laterality: Left;   LUMBAR LAMINECTOMY/DECOMPRESSION MICRODISCECTOMY Right 11/08/2021   Procedure: RIGHT L5-S1 MICRODISCECTOMY;  Surgeon: Clois Fret, MD;  Location: [facility];  Service: Neurosurgery;  Laterality: Right;   MASTECTOMY, PARTIAL Right 2010   REDUCTION MAMMAPLASTY Left 2011   RIGHT/LEFT HEART CATH AND CORONARY ANGIOGRAPHY Bilateral 08/06/2018   Procedure: RIGHT/LEFT HEART CATH AND CORONARY ANGIOGRAPHY;  Surgeon: Bosie Kent  A, MD;  Location: [facility];  Service: Cardiovascular;  Laterality: Bilateral;   SPINE SURGERY  2023   TOTAL KNEE ARTHROPLASTY Right 01/04/2022   Procedure: TOTAL KNEE ARTHROPLASTY;  Surgeon: Kathlynn Sharper, MD;  Location: [facility];  Service: Orthopedics;  Laterality: Right;   TRANSCATHETER AORTIC VALVE REPLACEMENT, TRANSAPICAL  08/18/2018   Procedure: TRANSCATHETER AORTIC VALVE REPLACEMENT; Location: [facility]; Surgeon: Bernardino Lares, MD   TUBAL LIGATION      Social History    Tobacco Use   Smoking status: Former    Current packs/day: 0.00    Average packs/day: 1 pack/day for 40.0 years (40.0 ttl pk-yrs)    Types: Cigarettes    Start date: 06/18/1972    Quit date: 06/18/2012    Years since quitting: 11.9   Smokeless tobacco: Never   Tobacco comments:    I quit smoking 11 years ago  Vaping Use   Vaping status: Never Used  Substance Use Topics   Alcohol  use: Not Currently   Drug use: No     Medication list has been reviewed and updated.  No outpatient medications have been marked as taking for the 05/22/24 encounter (Orders Only) with Justus Leita DEL, MD.       03/23/2024   10:00 AM 10/29/2023   11:11 AM 07/01/2023   11:17 AM 02/25/2023    9:25 AM  GAD 7 : Generalized Anxiety Score  Nervous, Anxious, on Edge 0 3 0 1  Control/stop worrying 0 0 0 0  Worry too much - different things 0 0 0 1  Trouble relaxing 0 0 0 0  Restless 0 0 0 0  Easily annoyed or irritable 0 1 0 0  Afraid - awful might happen 0 0 0 1  Total GAD 7 Score 0 4 0 3  Anxiety Difficulty Not difficult at all Not difficult at all Not difficult at all Not difficult at all       03/23/2024   10:00 AM 10/29/2023   11:11 AM 07/01/2023   11:16 AM  Depression screen PHQ 2/9  Decreased Interest 0 0 1  Down, Depressed, Hopeless 0 0 1  PHQ - 2 Score 0 0 2  Altered sleeping 0 0 0  Tired, decreased energy 0 0 0  Change in appetite 0 1 0  Feeling bad or failure about yourself  0 0 0  Trouble concentrating 0 0 0  Moving slowly or fidgety/restless 0 0 0  Suicidal thoughts 0 0 0  PHQ-9 Score 0  1  2   Difficult doing work/chores Not difficult at all Not difficult at all Not difficult at all     Data saved with a previous flowsheet row definition    BP Readings from Last 3 Encounters:  04/27/24 (!) 143/75  03/23/24 124/76  02/25/24 128/70    Physical Exam  Wt Readings from Last 3 Encounters:  04/27/24 248 lb 6.4 oz (112.7 kg)  03/23/24 242 lb (109.8 kg)  02/25/24 245 lb (111.1  kg)    There were no vitals taken for this visit.  Assessment and Plan:  Problem List Items Addressed This Visit   None   No follow-ups on file.    Leita HILARIO Justus, MD [facility] [place]

## 2024-06-25 ENCOUNTER — Telehealth: Payer: Self-pay | Admitting: Orthopedic Surgery

## 2024-06-25 NOTE — Telephone Encounter (Signed)
 Pt is looking to discuss sx options for her hand. Would this need to be sch with Dr.Smith for options?

## 2024-06-25 NOTE — Telephone Encounter (Signed)
 EMG of right upper extremities dated 04/13/24:  Impression: Abnormal study. 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  Thank you for the referral of this patient. It was our privilege to participate in care of your patient. Feel free to contact us  with any further questions.  _____________________________ Arthea Farrow, M.D.  If she is interested in surgery options, she can schedule appt with Claudene (would do 30 minute visit).

## 2024-06-25 NOTE — Telephone Encounter (Signed)
 Patient advised and she would like to discuss surgery, appointment scheduled with Dr Claudene 07/14/2023

## 2024-06-25 NOTE — Telephone Encounter (Signed)
 Looks like from you last note she mentions hand numbness and tingling. Do you want to see her again for a further workup on this before she talks to Dr. Claudene? She has not done a EMG for her hand.

## 2024-07-02 NOTE — Progress Notes (Signed)
 "   Referring Physician:  Justus Leita DEL, MD [address] [place],  [place] [address]  Primary Physician:  Justus Leita DEL, MD  History of Present Illness: I had a follow-up phone visit today with this patient.  She was at home and I was in the office.  Gave consent to go forward with a video/phone visit.  We discussed her right upper extremity symptoms.  She feels like these continue to get worse.  She wakes up and notices that her finger is stuck in a flexed position and that she has to work it free and often feels a popping sensation when she does so.  She also does wake up with some numbness at night but has had some relief with the wrist braces.  Review of Systems:  A 10 point review of systems is negative, except for the pertinent positives and negatives detailed in the HPI.  Past Medical History: Past Medical History:  Diagnosis Date   Absolute anemia 09/15/2015   Patient declines colonoscopy    Allergy    Shellfish   Aortic stenosis, moderate 04/05/2017   a.) s/p TAVR 08/18/2018; LEFT axillary approach   Arthritis    Breast cancer, right (HCC) 2010   a.) s/p RIGHT mastectomy; no chemotherpay or XRT   CAD (coronary artery disease) 08/06/2018   a.) R/LHC 08/06/2018: EF 35%; 20% stenosis oRI; severe AS (MPG 48.2 mmHg; AVA (VTI) = 0.58 cm); mean PA = 36 mmHg, mean PCWP = 27 mmHg, PVR 1.78 WU, CO 5.06 L/min, CI 2.25 L/min/m   CHF (congestive heart failure) (HCC)    a.)  TTE 07/29/2018: EF 35%; moderate global HK; moderate LVH; mild LA dilation; trivial TR/PR, mild AR/MR; moderate AS (MPG 48.2 mmHg); G2DD. b.) R/LHC 08/06/2018; EF 35%; mean PCWP 27 mmHg. c.)  TTE 10/20/2019: EF >55%; mild LVH; trivial MR/TR, mild PR, prostatic AoV. d.)  TTE 08/17/2021: EF >55%; mild LVH; mild LA dilation; trivial MR/TR/PR; prostatic AoV (MPG 12 mmHg); G1DD.   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 b.) CT abd 02/06/2019: measured 4.7 x 3.7 cm. c.) MRI lumbar spine 10/25/2021: interval increase to 7.1 x 7.1 cm.   T2DM (type 2 diabetes mellitus) [facility])     Past Surgical History: Past Surgical History:  Procedure Laterality Date   ADRENALECTOMY Right 05/28/2022   AMPUTATION TOE Left 10/04/2023   Procedure: AMPUTATION, TOE;  Surgeon: Ashley Soulier, DPM;  Location: [facility] OR[facility];  Service: Orthopedics/Podiatry;  Laterality: Left;   APPENDECTOMY     CARDIAC VALVE REPLACEMENT  2020   Aortic Valve   CATARACT EXTRACTION W/ INTRAOCULAR LENS  IMPLANT, BILATERAL  2010   CESAREAN SECTION  1983   CHOLECYSTECTOMY  2005   ECTOPIC PREGNANCY SURGERY     LOWER EXTREMITY ANGIOGRAPHY Left 09/24/2023   Procedure: Lower Extremity Angiography;  Surgeon: Jama Cordella MATSU, MD;  Location: [facility];  Service: Cardiovascular;  Laterality: Left;  LOWER EXTREMITY INTERVENTION Left 09/24/2023   Procedure: LOWER EXTREMITY INTERVENTION;  Surgeon: Jama Cordella MATSU, MD;  Location: [facility];  Service: Cardiovascular;  Laterality: Left;   LUMBAR LAMINECTOMY/DECOMPRESSION MICRODISCECTOMY Right 11/08/2021   Procedure: RIGHT L5-S1 MICRODISCECTOMY;  Surgeon: Clois Fret, MD;  Location: [facility];  Service: Neurosurgery;  Laterality: Right;   MASTECTOMY, PARTIAL Right 2010   REDUCTION MAMMAPLASTY Left 2011   RIGHT/LEFT HEART CATH AND CORONARY ANGIOGRAPHY Bilateral 08/06/2018   Procedure: RIGHT/LEFT HEART CATH AND CORONARY ANGIOGRAPHY;  Surgeon: Bosie Vinie LABOR,  MD;  Location: [facility];  Service: Cardiovascular;  Laterality: Bilateral;   SPINE SURGERY  2023   TOTAL KNEE ARTHROPLASTY Right 01/04/2022   Procedure: TOTAL KNEE ARTHROPLASTY;  Surgeon: Kathlynn Sharper, MD;  Location: [facility];  Service: Orthopedics;  Laterality: Right;   TRANSCATHETER AORTIC VALVE REPLACEMENT, TRANSAPICAL  08/18/2018   Procedure: TRANSCATHETER AORTIC VALVE REPLACEMENT; Location: [facility]; Surgeon: Bernardino Lares, MD    Allergies: Allergies as of 02/25/2024 - Review Complete 02/25/2024  Allergen Reaction Noted   Shellfish allergy Hives and Itching 08/18/2018    Medications: Outpatient Encounter Medications as of 02/25/2024  Medication Sig   aspirin  EC 81 MG tablet Take 1 tablet by mouth daily.   atorvastatin  (LIPITOR) 20 MG tablet Take 1 tablet (20 mg total) by mouth daily.   Blood Glucose Monitoring Suppl DEVI 1 each by Does not apply route in the morning, at noon, and at bedtime. May substitute to any manufacturer covered by patient's insurance.   clobetasol cream (TEMOVATE) 0.05 % Apply 1 Application topically as needed.   clopidogrel  (PLAVIX ) 75 MG tablet Take 1 tablet (75 mg total) by mouth daily.   Cyanocobalamin  (VITAMIN B-12 PO) Take 1 tablet by mouth daily at 6 (six) AM.   docusate sodium  (COLACE) 100 MG capsule Take 1 capsule (100 mg total) by mouth 2 (two) times daily. (Patient taking differently: Take 100 mg by mouth 2 (two) times daily as needed.)   glucose blood (ONETOUCH ULTRA) test strip USE AS DIRECTED EVERY MORNING, NOON AND EVERY EVENING AS DIRECTED   ketoconazole (NIZORAL) 2 % cream Apply 1 Application topically 2 (two) times daily as needed.   Lancets (ONETOUCH DELICA PLUS LANCET33G) MISC USE AS DIRECTED EVERY MORNING, NOON AND EVERY EVENING   levothyroxine  (SYNTHROID ) 150 MCG tablet Take 75-150 mcg by mouth See admin instructions. Take 150 mcg daily except Sundays take 75 mcg   lisinopril -hydrochlorothiazide  (ZESTORETIC ) 10-12.5 MG tablet Take 1  tablet by mouth daily.   loratadine (CLARITIN) 10 MG tablet Take 10 mg by mouth every morning.   metFORMIN  (GLUCOPHAGE -XR) 500 MG 24 hr tablet TAKE 2 TABLETS(1000 MG) BY MOUTH TWICE DAILY   Polyvinyl Alcohol -Povidone (REFRESH OP) Place 1 drop into both eyes as needed.   pregabalin  (LYRICA ) 50 MG capsule Take 2 capsules (100 mg total) by mouth 3 (three) times daily.   senna (SENOKOT) 8.6 MG TABS tablet Take 1 tablet (8.6 mg total) by mouth daily as needed for mild constipation.   traMADol  (ULTRAM ) 50 MG tablet Taking 2 in the AM, 2 at midday and 1 at bedtime   [DISCONTINUED] albuterol  (VENTOLIN  HFA) 108 (90 Base) MCG/ACT inhaler Inhale 2 puffs into the lungs every 6 (six) hours as needed for wheezing or shortness of breath.   [DISCONTINUED] chlorpheniramine-HYDROcodone (TUSSIONEX) 10-8 MG/5ML Take 5 mLs by mouth every 12 (twelve) hours as needed for cough.   [DISCONTINUED] traMADol  (ULTRAM ) 50 MG tablet One tablet orally 5 times per day.  No facility-administered encounter medications on file as of 02/25/2024.    Social History: Social History   Tobacco Use   Smoking status: Former    Current packs/day: 0.00    Average packs/day: 1 pack/day for 40.0 years (40.0 ttl pk-yrs)    Types: Cigarettes    Start date: 06/18/1972    Quit date: 06/18/2012    Years since quitting: 11.6   Smokeless tobacco: Never  Vaping Use   Vaping status: Never Used  Substance Use Topics   Alcohol  use: Not Currently   Drug use: No    Family Medical History: Family History  Problem Relation Age of Onset   Hypertension Brother    Diabetes Brother    Early death Brother    Vision loss Brother    Drug abuse Brother    Breast cancer Neg Hx     Physical Examination: Virtual visit, no physical examination  Medical Decision Making  EMG/nerve conduction study reviewed, 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  Assessment and Plan: 74-year.-old woman with a  history of right-handed pain started in approximately July or August.  She was seen in neurosurgery clinic in follow-up in consideration was for a peripheral entrapment neuropathy so EMG referral was made.  Results of that testing included a right sided ulnar nerve entrapment and a moderate carpal tunnel entrapment.  She states that her worst symptoms are in her index finger where she wakes up in her index finger is stuck down this causes radiation of pain all the way into her forearm.  She has to work to loosen it and can eventually feel a pop to straighten out.  She does use nighttime bracing and states that that has helped some with the numbness and tingling but that the pain in her finger continues to be present.  She is not on any NSAIDs for arthritis but does use aspirin  and Plavix  for cardiac reasons.  She also takes Tylenol .  I will plan on having her come in for an in person evaluation prior to any consideration for carpal tunnel release as she states that her hand has gotten progressively worse and that the symptoms have gotten more prominent and more often.  It does sound like she is developed a significant trigger finger as well, I will make a referral over to my hand partner for management.  We discussed that some cases carpal tunnel surgery can make trigger finger worse so I want to make sure to address this if this is her main issue.  Spent a total of 10 minutes on this video and phone visit.   Penne MICAEL Sharps, MD Dept. of Neurosurgery  "

## 2024-07-13 ENCOUNTER — Telehealth: Admitting: Neurosurgery

## 2024-07-13 DIAGNOSIS — G5621 Lesion of ulnar nerve, right upper limb: Secondary | ICD-10-CM | POA: Insufficient documentation

## 2024-07-13 DIAGNOSIS — G5601 Carpal tunnel syndrome, right upper limb: Secondary | ICD-10-CM | POA: Insufficient documentation

## 2024-07-13 DIAGNOSIS — M79641 Pain in right hand: Secondary | ICD-10-CM | POA: Insufficient documentation

## 2024-07-20 ENCOUNTER — Ambulatory Visit: Admitting: Neurosurgery

## 2024-07-22 ENCOUNTER — Other Ambulatory Visit: Payer: Self-pay

## 2024-07-23 ENCOUNTER — Ambulatory Visit: Admitting: Neurosurgery

## 2024-07-23 VITALS — BP 128/60 | Ht 68.0 in | Wt 232.2 lb

## 2024-07-23 DIAGNOSIS — M65321 Trigger finger, right index finger: Secondary | ICD-10-CM | POA: Insufficient documentation

## 2024-07-23 NOTE — Progress Notes (Signed)
 " Referring Physician:  Justus Leita DEL, MD [address] [place],  [place] [address]  Primary Physician:  Justus Leita DEL, MD  History of Present Illness: 07/23/2024 [name] has a history of HTN, hypothyroidism, DM with neuropathy, hyperlipidemia, CKD stage 3b, breast CA, history of aortic valve replacement, obesity. History of right L5-S1 microdiscectomy on 11/08/21 by Dr. Clois.  Today presents with R digit 2 constant pain. Reports a pop at the MP joint, it claws in the morning and flattens over time, cannot fully straighten.   Review of Systems:  A 10 point review of systems is negative, except for the pertinent positives and negatives detailed in the HPI.  Past Medical History: Past Medical History:  Diagnosis Date   Absolute anemia 09/15/2015   Patient declines colonoscopy    Acute on chronic systolic (congestive) heart failure (HCC) 03/23/2024   Allergy    Shellfish   Aortic stenosis, moderate 04/05/2017   a.) s/p TAVR 08/18/2018; LEFT axillary approach   Arthritis    Breast cancer, right (HCC) 2010   a.) s/p RIGHT mastectomy; no chemotherpay or XRT   CAD (coronary artery disease) 08/06/2018   a.) R/LHC 08/06/2018: EF 35%; 20% stenosis oRI; severe AS (MPG 48.2 mmHg; AVA (VTI) = 0.58 cm); mean PA = 36 mmHg, mean PCWP = 27 mmHg, PVR 1.78 WU, CO 5.06 L/min, CI 2.25 L/min/m   CHF (congestive heart failure) (HCC)    a.)  TTE 07/29/2018: EF 35%; moderate global HK; moderate LVH; mild LA dilation; trivial TR/PR, mild AR/MR; moderate AS (MPG 48.2 mmHg); G2DD. b.) R/LHC 08/06/2018; EF 35%; mean PCWP 27 mmHg. c.)  TTE 10/20/2019: EF >55%; mild LVH; trivial MR/TR, mild PR, prostatic AoV. d.)  TTE 08/17/2021: EF >55%; mild LVH; mild LA dilation; trivial MR/TR/PR; prostatic AoV (MPG 12 mmHg); G1DD.   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 b.) CT abd 02/06/2019: measured 4.7 x 3.7 cm. c.) MRI lumbar spine 10/25/2021: interval increase to 7.1 x 7.1 cm.   T2DM (type 2 diabetes mellitus) [facility])     Past Surgical History: Past Surgical History:  Procedure Laterality Date   ADRENALECTOMY Right 05/28/2022   AMPUTATION TOE Left 10/04/2023   Procedure: AMPUTATION, TOE;  Surgeon: Ashley Soulier, DPM;  Location: [facility];  Service: Orthopedics/Podiatry;  Laterality: Left;   APPENDECTOMY     CARDIAC VALVE REPLACEMENT  2020   Aortic Valve   CATARACT EXTRACTION W/ INTRAOCULAR LENS  IMPLANT, BILATERAL  2010   CESAREAN SECTION  1983   CHOLECYSTECTOMY  2005   ECTOPIC PREGNANCY SURGERY     EYE SURGERY     LOWER EXTREMITY ANGIOGRAPHY Left 09/24/2023   Procedure: Lower Extremity Angiography;  Surgeon: Jama Cordella MATSU, MD;  Location: [facility];  Service: Cardiovascular;  Laterality: Left;   LOWER EXTREMITY INTERVENTION Left 09/24/2023   Procedure: LOWER EXTREMITY INTERVENTION;  Surgeon: Jama Cordella MATSU, MD;  Location: [facility] INVASIVE  CV LAB;  Service: Cardiovascular;  Laterality: Left;   LUMBAR LAMINECTOMY/DECOMPRESSION MICRODISCECTOMY Right 11/08/2021   Procedure: RIGHT L5-S1 MICRODISCECTOMY;  Surgeon: Clois Fret, MD;  Location: [facility];  Service: Neurosurgery;  Laterality: Right;   MASTECTOMY, PARTIAL Right 2010   REDUCTION MAMMAPLASTY Left 2011   RIGHT/LEFT HEART CATH AND CORONARY ANGIOGRAPHY Bilateral 08/06/2018   Procedure: RIGHT/LEFT HEART CATH AND CORONARY ANGIOGRAPHY;  Surgeon: Bosie Vinie LABOR, MD;  Location:  [facility];  Service: Cardiovascular;  Laterality: Bilateral;   SPINE SURGERY  2023   TOTAL KNEE ARTHROPLASTY Right 01/04/2022   Procedure: TOTAL KNEE ARTHROPLASTY;  Surgeon: Kathlynn Sharper, MD;  Location: [facility];  Service: Orthopedics;  Laterality: Right;   TRANSCATHETER AORTIC VALVE REPLACEMENT, TRANSAPICAL  08/18/2018   Procedure: TRANSCATHETER AORTIC VALVE REPLACEMENT; Location: [facility]; Surgeon: Bernardino Lares, MD   TUBAL LIGATION      Allergies: Allergies as of 07/23/2024 - Review Complete 07/23/2024  Allergen Reaction Noted   Shellfish allergy Hives and Itching 08/18/2018    Medications: Outpatient Encounter Medications as of 07/23/2024  Medication Sig   aspirin  EC 81 MG tablet Take 1 tablet by mouth daily.   atorvastatin  (LIPITOR) 20 MG tablet Take 1 tablet (20 mg total) by mouth daily.   Blood Glucose Monitoring Suppl DEVI 1 each by Does not apply route in the morning, at noon, and at bedtime. May substitute to any manufacturer covered by patient's insurance.   clobetasol cream (TEMOVATE) 0.05 % Apply 1 Application topically as needed.   clopidogrel  (PLAVIX ) 75 MG tablet Take 1 tablet (75 mg total) by mouth daily.   Cyanocobalamin  (VITAMIN B-12 PO) Take 1 tablet by mouth daily at 6 (six) AM.   glucose blood (ONETOUCH ULTRA) test strip USE AS DIRECTED EVERY MORNING, NOON AND EVERY EVENING AS DIRECTED   ketoconazole (NIZORAL) 2 % cream Apply 1 Application topically 2 (two) times daily as needed.   Lancets (ONETOUCH DELICA PLUS LANCET33G) MISC USE AS DIRECTED EVERY MORNING, NOON AND EVERY EVENING   levothyroxine  (SYNTHROID ) 150 MCG tablet Take 75-150 mcg by mouth See admin instructions. Take 150 mcg daily except Sundays take 75 mcg   lisinopril -hydrochlorothiazide  (ZESTORETIC ) 10-12.5 MG tablet Take 1 tablet by mouth daily.   loratadine (CLARITIN) 10 MG tablet Take 10 mg by mouth every morning.   metFORMIN  (GLUCOPHAGE -XR) 500 MG 24 hr tablet TAKE 2 TABLETS(1000 MG) BY MOUTH  TWICE DAILY   Polyvinyl Alcohol -Povidone (REFRESH OP) Place 1 drop into both eyes as needed.   pregabalin  (LYRICA ) 50 MG capsule Take 2 capsules (100 mg total) by mouth 3 (three) times daily.   traMADol  (ULTRAM ) 50 MG tablet Taking 2 in the AM, 2 at midday and 1 at bedtime   [DISCONTINUED] docusate sodium  (COLACE) 100 MG capsule Take 1 capsule (100 mg total) by mouth 2 (two) times daily. (Patient taking differently: Take 100 mg by mouth 2 (two) times daily as needed.)   [DISCONTINUED] senna (SENOKOT) 8.6 MG TABS tablet Take 1 tablet (8.6 mg total) by mouth daily as needed for mild constipation.   No facility-administered encounter medications on file as of 07/23/2024.    Social History: Social History   Tobacco Use   Smoking status: Former    Current packs/day: 0.00    Average packs/day: 1 pack/day for 40.0 years (40.0 ttl pk-yrs)    Types: Cigarettes    Start date: 06/18/1972    Quit date: 06/18/2012    Years since quitting: 12.1   Smokeless tobacco: Never   Tobacco  comments:    I quit smoking 11 years ago  Vaping Use   Vaping status: Never Used  Substance Use Topics   Alcohol  use: Not Currently   Drug use: No    Family Medical History: Family History  Problem Relation Age of Onset   Alcohol  abuse Father    Arthritis Father    Hypertension Brother    Diabetes Brother    Early death Brother    Vision loss Brother    Drug abuse Brother    ADD / ADHD Son    Breast cancer Neg Hx     Physical Examination: Vitals:   07/23/24 1311  BP: 128/60    General: Patient is well developed, well nourished, calm, collected, and in no apparent distress. Attention to examination is appropriate.  Respiratory: Patient is breathing without any difficulty.   NEUROLOGICAL:     Awake, alert, oriented to person, place, and time.  Speech is clear and fluent. Fund of knowledge is appropriate.    Strength: Strength 4/5 in joint. 5/5 in all other areas.   Palpable tendon MP pop with  extension present.    Medical Decision Making  Imaging: none  Assessment and Plan: Ms. Wahlquist presents today with concerns for potential carpal tunnel in her R 2nd digit. She describes it as in constant pain. Reports it pops at the MP joint constantly, it claws in the morning and flattens over time, cannot fully straighten. Sensation with intact and strength is 4/5 in the digit.   At this time I am highly suspicious we are dealing with trigger finger as opposed to carpal tunnel as she does not have any of the classic signs of carpal tunnel. I have referred her to Dr. Ezra with [facility] to further assess these findings.    I spent a total of 25 minutes in face-to-face and non-face-to-face activities related to this patient's care today including review of outside records, review of imaging, review of symptoms, physical exam, discussion of differential diagnosis, discussion of treatment options, and documentation.   Thank you for involving me in the care of this patient.   Jayson Eis NP-C Dept. of Neurosurgery   Addendum: I saw this patient in conjunction with APP Peickert, I have edited the above note as necessary.  Agree with the documentation.  Will plan for referral to hand surgery for possible trigger finger treatment. "

## 2024-07-24 ENCOUNTER — Encounter: Admitting: Family Medicine

## 2024-07-27 ENCOUNTER — Encounter: Admitting: Family Medicine

## 2024-07-27 ENCOUNTER — Ambulatory Visit (INDEPENDENT_AMBULATORY_CARE_PROVIDER_SITE_OTHER): Admitting: Vascular Surgery

## 2024-07-27 ENCOUNTER — Encounter (INDEPENDENT_AMBULATORY_CARE_PROVIDER_SITE_OTHER)

## 2024-08-06 ENCOUNTER — Ambulatory Visit (INDEPENDENT_AMBULATORY_CARE_PROVIDER_SITE_OTHER): Admitting: Vascular Surgery

## 2024-09-07 ENCOUNTER — Ambulatory Visit (INDEPENDENT_AMBULATORY_CARE_PROVIDER_SITE_OTHER): Admitting: Vascular Surgery

## 2024-09-07 ENCOUNTER — Encounter (INDEPENDENT_AMBULATORY_CARE_PROVIDER_SITE_OTHER)
